# Patient Record
Sex: Female | Born: 1985 | Race: White | Hispanic: No | Smoking: Current every day smoker
Health system: Southern US, Community
[De-identification: ages and names within clinical notes are randomized; demographics above are authoritative.]

## PROBLEM LIST (undated history)

## (undated) ENCOUNTER — Inpatient Hospital Stay (HOSPITAL_COMMUNITY): Payer: Self-pay

## (undated) DIAGNOSIS — F32A Depression, unspecified: Secondary | ICD-10-CM

## (undated) DIAGNOSIS — T7840XA Allergy, unspecified, initial encounter: Secondary | ICD-10-CM

## (undated) DIAGNOSIS — E282 Polycystic ovarian syndrome: Secondary | ICD-10-CM

## (undated) DIAGNOSIS — F329 Major depressive disorder, single episode, unspecified: Secondary | ICD-10-CM

## (undated) HISTORY — DX: Allergy, unspecified, initial encounter: T78.40XA

## (undated) HISTORY — PX: FOOT SURGERY: SHX648

## (undated) HISTORY — PX: WISDOM TOOTH EXTRACTION: SHX21

## (undated) HISTORY — PX: NO PAST SURGERIES: SHX2092

---
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
## 2006-05-13 ENCOUNTER — Emergency Department (HOSPITAL_COMMUNITY): Admission: EM | Admit: 2006-05-13 | Discharge: 2006-05-13 | Payer: Self-pay | Admitting: Emergency Medicine

## 2006-05-17 ENCOUNTER — Emergency Department (HOSPITAL_COMMUNITY): Admission: EM | Admit: 2006-05-17 | Discharge: 2006-05-17 | Payer: Self-pay | Admitting: Emergency Medicine

## 2006-12-11 ENCOUNTER — Emergency Department (HOSPITAL_COMMUNITY): Admission: EM | Admit: 2006-12-11 | Discharge: 2006-12-11 | Payer: Self-pay | Admitting: Emergency Medicine

## 2006-12-12 ENCOUNTER — Emergency Department (HOSPITAL_COMMUNITY): Admission: EM | Admit: 2006-12-12 | Discharge: 2006-12-12 | Payer: Self-pay | Admitting: Emergency Medicine

## 2006-12-30 ENCOUNTER — Ambulatory Visit: Payer: Self-pay | Admitting: Obstetrics and Gynecology

## 2006-12-30 ENCOUNTER — Encounter (INDEPENDENT_AMBULATORY_CARE_PROVIDER_SITE_OTHER): Payer: Self-pay | Admitting: Obstetrics & Gynecology

## 2006-12-30 ENCOUNTER — Encounter (INDEPENDENT_AMBULATORY_CARE_PROVIDER_SITE_OTHER): Payer: Self-pay | Admitting: *Deleted

## 2007-01-19 ENCOUNTER — Emergency Department (HOSPITAL_COMMUNITY): Admission: EM | Admit: 2007-01-19 | Discharge: 2007-01-19 | Payer: Self-pay | Admitting: Emergency Medicine

## 2007-06-30 ENCOUNTER — Emergency Department (HOSPITAL_COMMUNITY): Admission: EM | Admit: 2007-06-30 | Discharge: 2007-06-30 | Payer: Self-pay | Admitting: Emergency Medicine

## 2007-10-17 ENCOUNTER — Ambulatory Visit: Payer: Self-pay | Admitting: Infectious Disease

## 2007-10-17 ENCOUNTER — Observation Stay (HOSPITAL_COMMUNITY): Admission: EM | Admit: 2007-10-17 | Discharge: 2007-10-18 | Payer: Self-pay | Admitting: Emergency Medicine

## 2007-10-24 ENCOUNTER — Ambulatory Visit: Payer: Self-pay | Admitting: Infectious Diseases

## 2007-10-24 DIAGNOSIS — F329 Major depressive disorder, single episode, unspecified: Secondary | ICD-10-CM | POA: Insufficient documentation

## 2007-10-24 DIAGNOSIS — J45909 Unspecified asthma, uncomplicated: Secondary | ICD-10-CM | POA: Insufficient documentation

## 2007-10-24 DIAGNOSIS — N898 Other specified noninflammatory disorders of vagina: Secondary | ICD-10-CM | POA: Insufficient documentation

## 2008-01-22 ENCOUNTER — Emergency Department (HOSPITAL_COMMUNITY): Admission: EM | Admit: 2008-01-22 | Discharge: 2008-01-23 | Payer: Self-pay | Admitting: Emergency Medicine

## 2008-01-23 ENCOUNTER — Telehealth: Payer: Self-pay | Admitting: Infectious Diseases

## 2008-03-01 ENCOUNTER — Emergency Department (HOSPITAL_COMMUNITY): Admission: EM | Admit: 2008-03-01 | Discharge: 2008-03-01 | Payer: Self-pay | Admitting: Emergency Medicine

## 2008-03-07 ENCOUNTER — Emergency Department (HOSPITAL_COMMUNITY): Admission: EM | Admit: 2008-03-07 | Discharge: 2008-03-07 | Payer: Self-pay | Admitting: Emergency Medicine

## 2008-03-10 ENCOUNTER — Inpatient Hospital Stay (HOSPITAL_COMMUNITY): Admission: EM | Admit: 2008-03-10 | Discharge: 2008-03-12 | Payer: Self-pay | Admitting: Emergency Medicine

## 2008-05-03 ENCOUNTER — Telehealth (INDEPENDENT_AMBULATORY_CARE_PROVIDER_SITE_OTHER): Payer: Self-pay | Admitting: *Deleted

## 2008-07-19 ENCOUNTER — Emergency Department (HOSPITAL_COMMUNITY): Admission: EM | Admit: 2008-07-19 | Discharge: 2008-07-19 | Payer: Self-pay | Admitting: Emergency Medicine

## 2008-08-12 ENCOUNTER — Emergency Department (HOSPITAL_COMMUNITY): Admission: EM | Admit: 2008-08-12 | Discharge: 2008-08-13 | Payer: Self-pay | Admitting: Emergency Medicine

## 2008-08-14 ENCOUNTER — Emergency Department (HOSPITAL_COMMUNITY): Admission: EM | Admit: 2008-08-14 | Discharge: 2008-08-14 | Payer: Self-pay | Admitting: Emergency Medicine

## 2008-08-16 ENCOUNTER — Emergency Department (HOSPITAL_COMMUNITY): Admission: EM | Admit: 2008-08-16 | Discharge: 2008-08-17 | Payer: Self-pay | Admitting: Emergency Medicine

## 2008-09-17 ENCOUNTER — Emergency Department (HOSPITAL_COMMUNITY): Admission: EM | Admit: 2008-09-17 | Discharge: 2008-09-17 | Payer: Self-pay | Admitting: Emergency Medicine

## 2008-12-06 ENCOUNTER — Emergency Department (HOSPITAL_COMMUNITY): Admission: EM | Admit: 2008-12-06 | Discharge: 2008-12-06 | Payer: Self-pay | Admitting: Emergency Medicine

## 2009-01-15 ENCOUNTER — Emergency Department (HOSPITAL_COMMUNITY): Admission: EM | Admit: 2009-01-15 | Discharge: 2009-01-15 | Payer: Self-pay | Admitting: Family Medicine

## 2009-01-16 ENCOUNTER — Inpatient Hospital Stay (HOSPITAL_COMMUNITY): Admission: AD | Admit: 2009-01-16 | Discharge: 2009-01-16 | Payer: Self-pay | Admitting: Obstetrics & Gynecology

## 2009-01-17 ENCOUNTER — Ambulatory Visit (HOSPITAL_COMMUNITY): Admission: RE | Admit: 2009-01-17 | Discharge: 2009-01-17 | Payer: Self-pay | Admitting: Obstetrics & Gynecology

## 2009-01-28 ENCOUNTER — Emergency Department (HOSPITAL_COMMUNITY): Admission: EM | Admit: 2009-01-28 | Discharge: 2009-01-28 | Payer: Self-pay | Admitting: Emergency Medicine

## 2009-02-28 ENCOUNTER — Ambulatory Visit: Payer: Self-pay | Admitting: Obstetrics and Gynecology

## 2009-03-01 ENCOUNTER — Encounter: Payer: Self-pay | Admitting: Obstetrics & Gynecology

## 2009-03-01 LAB — CONVERTED CEMR LAB: TSH: 2.708 microintl units/mL (ref 0.350–4.500)

## 2009-03-13 ENCOUNTER — Encounter: Admission: RE | Admit: 2009-03-13 | Discharge: 2009-03-13 | Payer: Self-pay | Admitting: Obstetrics and Gynecology

## 2009-03-15 ENCOUNTER — Ambulatory Visit (HOSPITAL_COMMUNITY): Admission: RE | Admit: 2009-03-15 | Discharge: 2009-03-15 | Payer: Self-pay | Admitting: Obstetrics & Gynecology

## 2009-04-25 ENCOUNTER — Ambulatory Visit: Payer: Self-pay | Admitting: Obstetrics and Gynecology

## 2009-07-08 ENCOUNTER — Emergency Department (HOSPITAL_COMMUNITY): Admission: EM | Admit: 2009-07-08 | Discharge: 2009-07-08 | Payer: Self-pay | Admitting: Emergency Medicine

## 2009-08-01 ENCOUNTER — Ambulatory Visit: Payer: Self-pay | Admitting: Obstetrics & Gynecology

## 2009-10-15 ENCOUNTER — Observation Stay (HOSPITAL_COMMUNITY): Admission: EM | Admit: 2009-10-15 | Discharge: 2009-10-15 | Payer: Self-pay | Admitting: Emergency Medicine

## 2010-03-02 IMAGING — CR DG CHEST 2V
2 series · 2 of 2 positions shown · non-contrast
Comparison: 07/19/2008 and 01/19/2007

CLINICAL DATA: Shortness of breath and cough.

CHEST - 2 VIEW

[w chest pa]
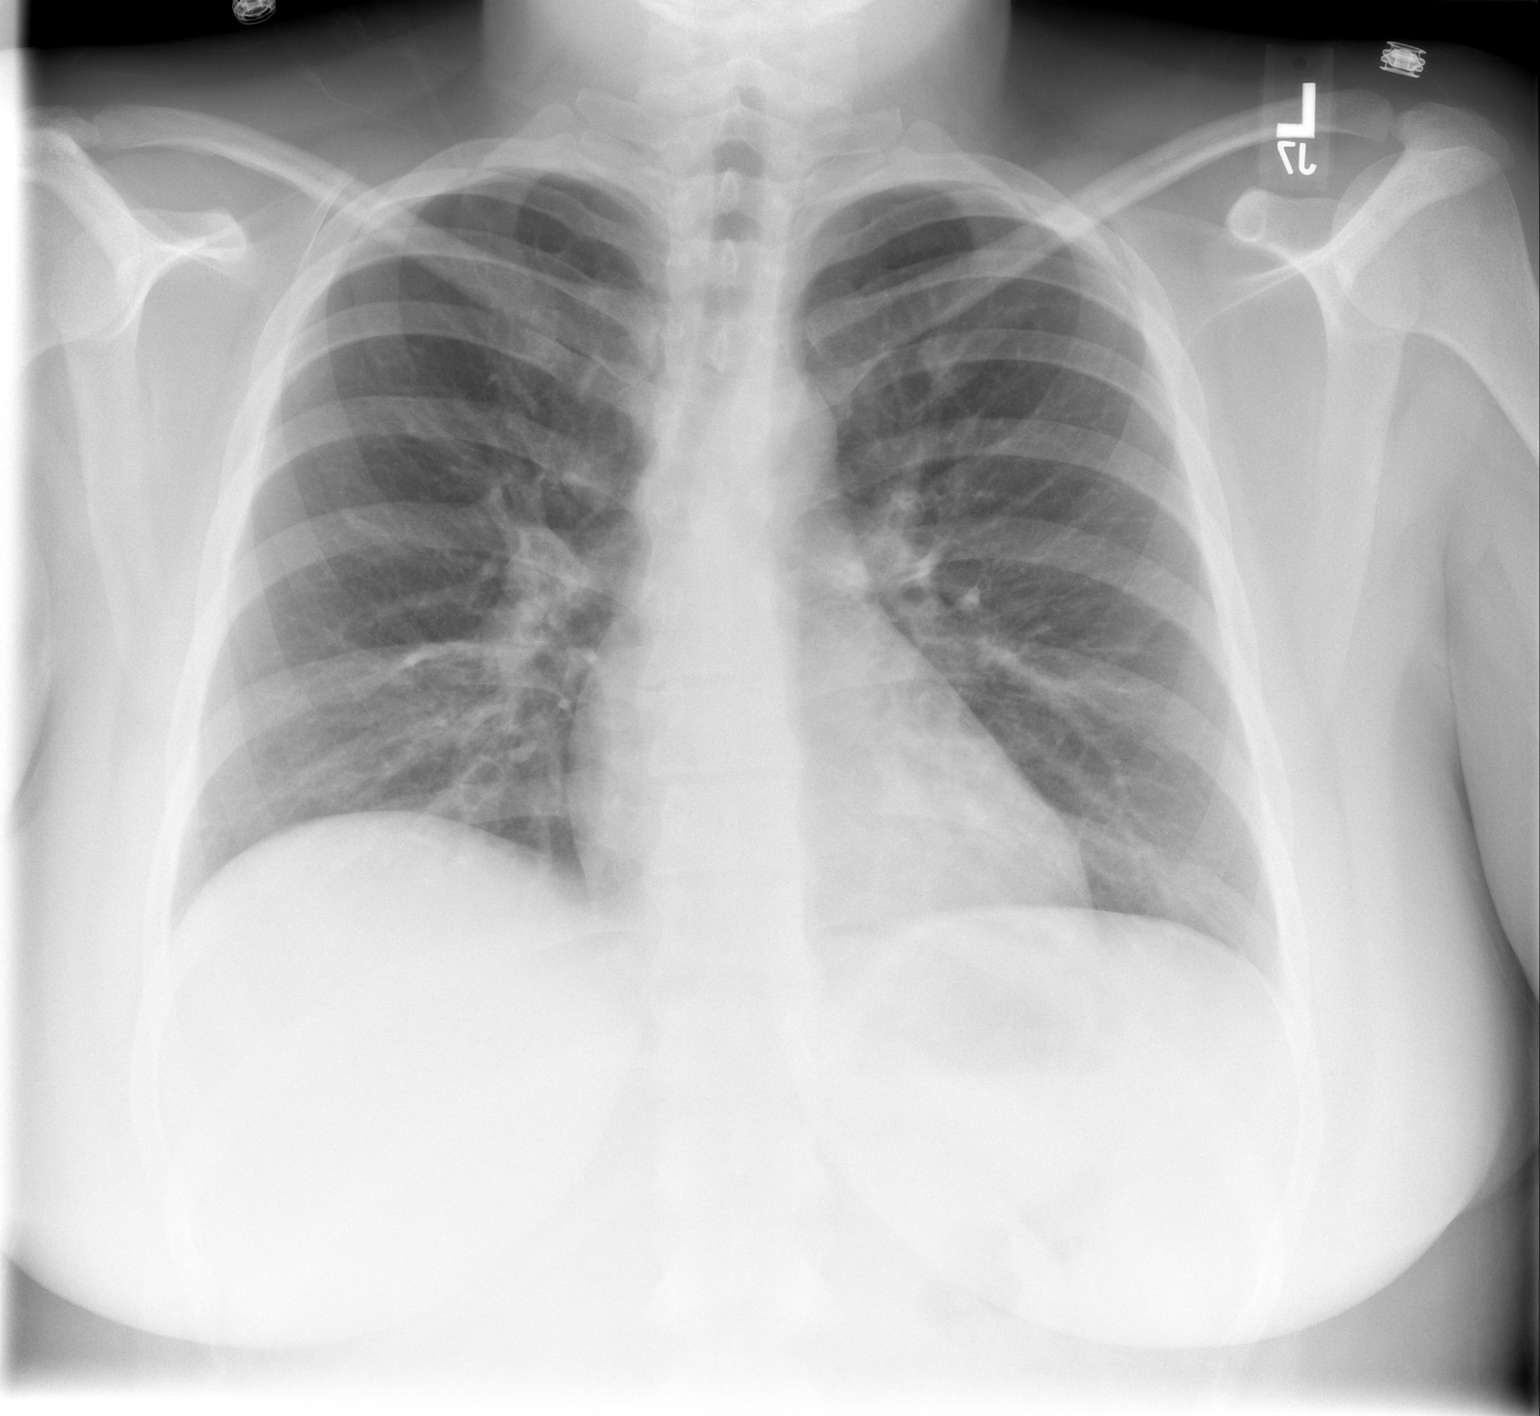

[w chest lat]
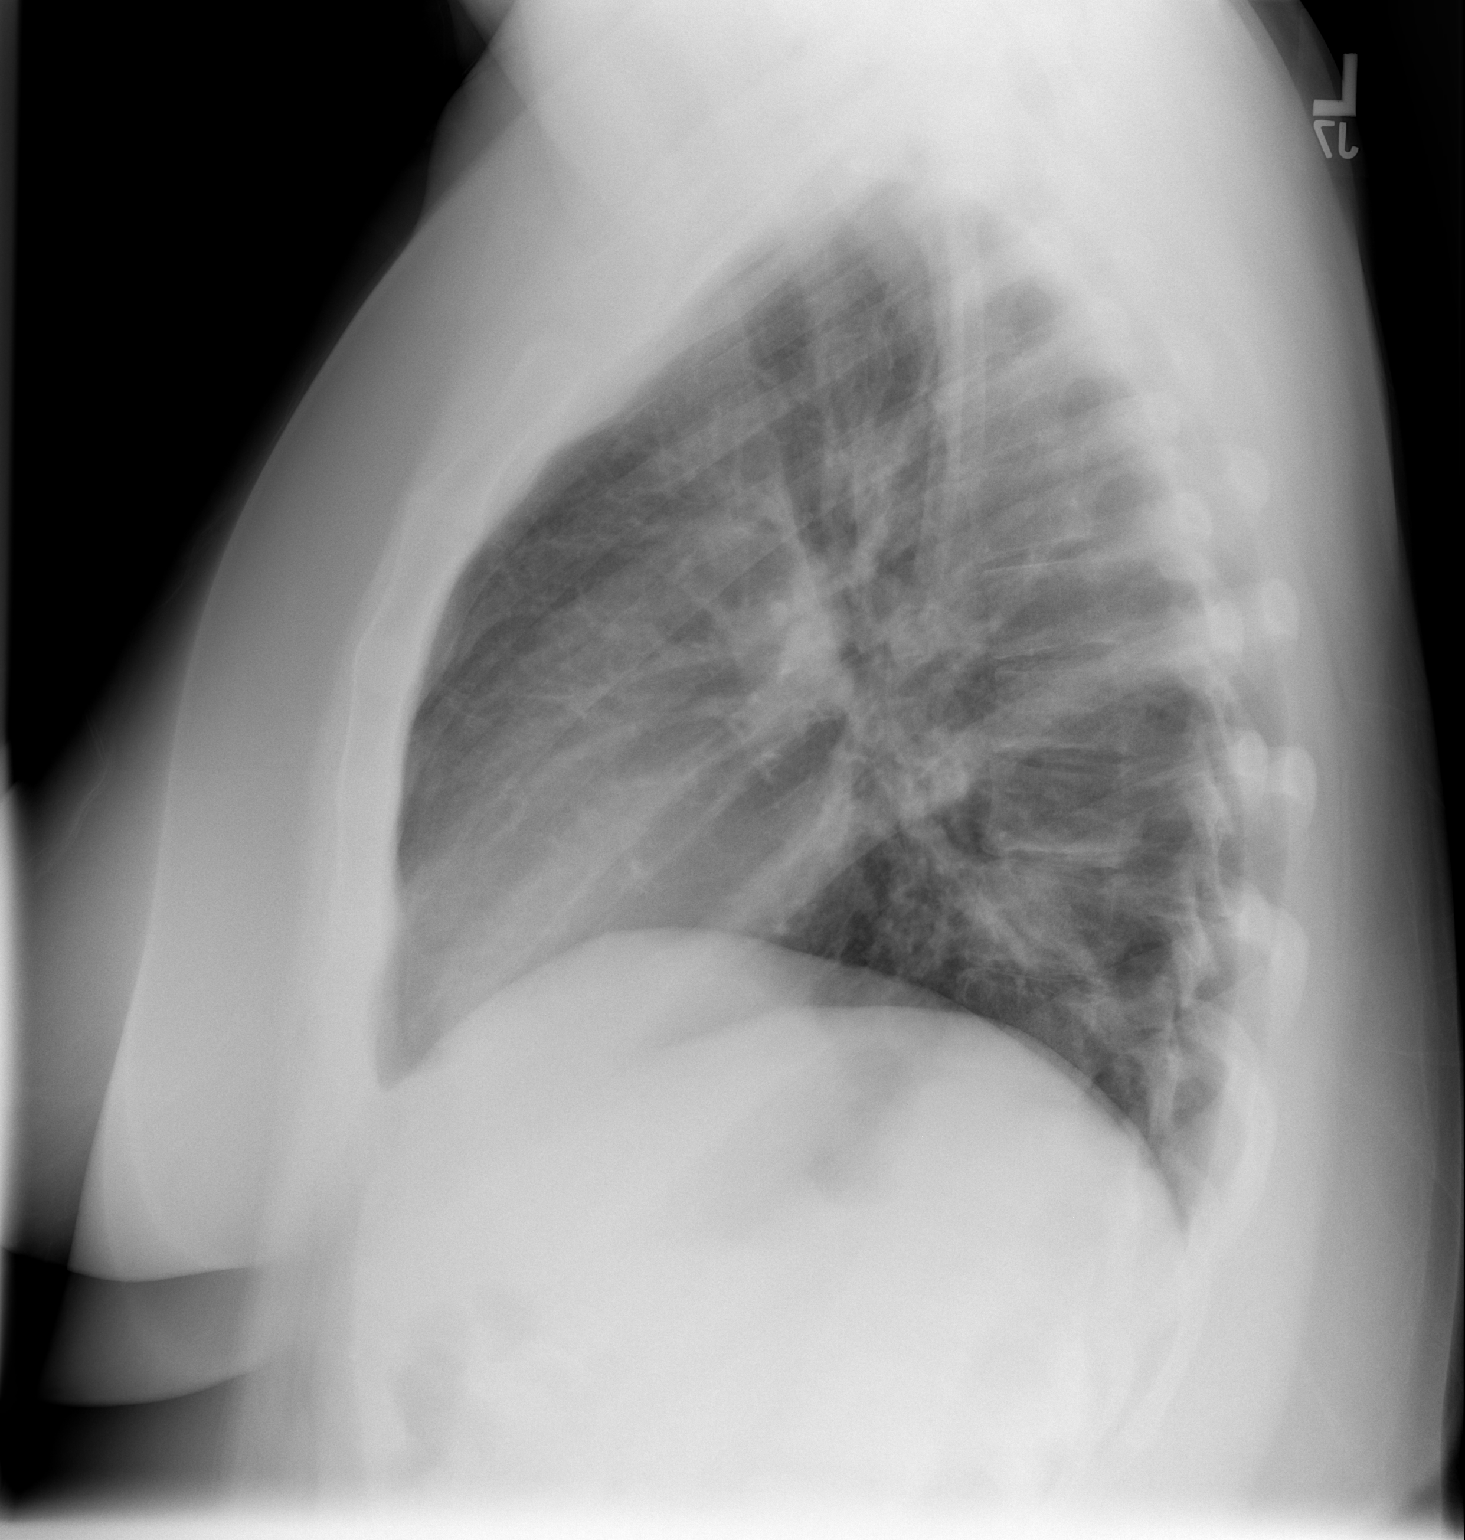

[2 of 2 positions shown; findings below may reference images not displayed]

FINDINGS: Cardiomediastinal silhouette is unremarkable.
Mild peribronchial thickening is stable.
There is no evidence of focal airspace disease, pleural effusions,
or pneumothorax.
No acute bony abnormalities are identified.
IMPRESSION: No evidence of acute cardiopulmonary disease.

Stable mild chronic peribronchial thickening.

## 2010-03-11 ENCOUNTER — Emergency Department (HOSPITAL_COMMUNITY): Admission: EM | Admit: 2010-03-11 | Discharge: 2010-03-11 | Payer: Self-pay | Admitting: Emergency Medicine

## 2010-03-31 ENCOUNTER — Emergency Department (HOSPITAL_COMMUNITY): Admission: EM | Admit: 2010-03-31 | Discharge: 2010-03-31 | Payer: Self-pay | Admitting: Emergency Medicine

## 2010-07-02 IMAGING — US US PELVIS COMPLETE MODIFY
2 series · 13 of 25 positions shown · non-contrast
Comparison: None

CLINICAL DATA: Abdominal and pelvic pain

TRANSABDOMINAL AND TRANSVAGINAL ULTRASOUND OF PELVIS
TECHNIQUE: Both transabdominal and transvaginal ultrasound
examinations of the pelvis were performed including evaluation of
the uterus, ovaries, adnexal regions, and pelvic cul-de-sac.

[Series 1: us pelvis complete modify · 12 of 69 slices shown (1 of 2)]
[im 1/69]
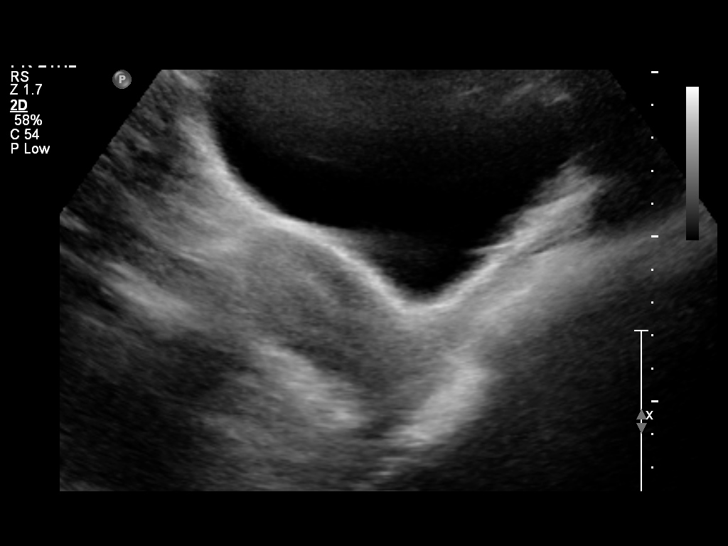
[im 7/69]
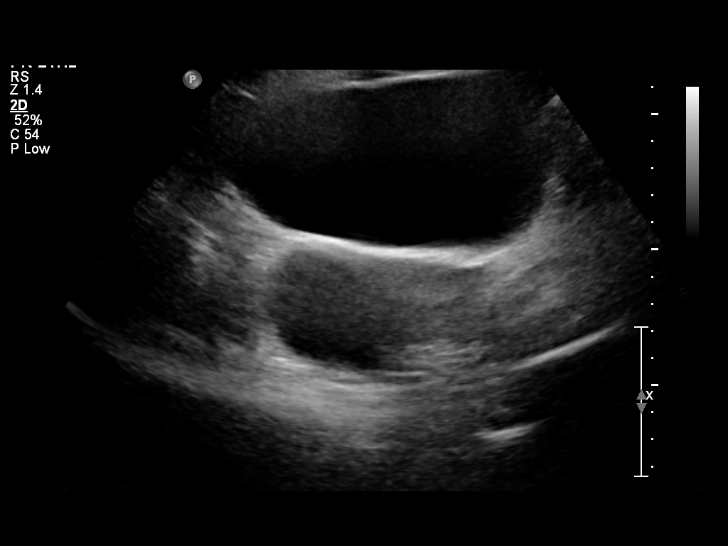
[im 13/69]
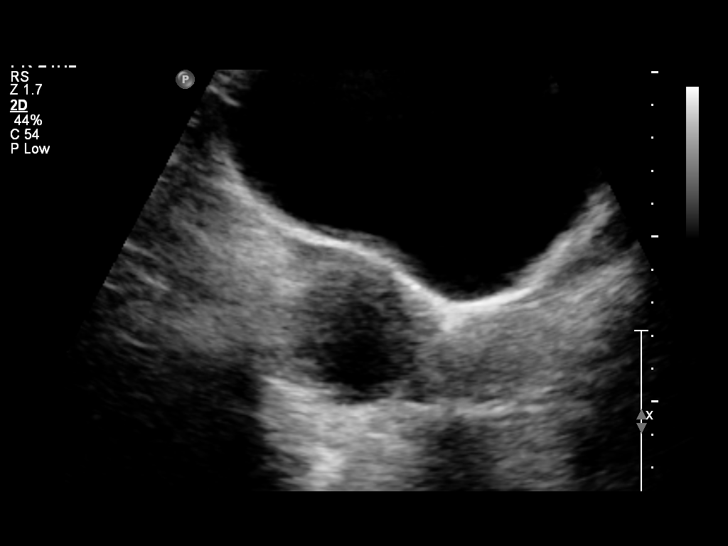
[im 19/69]
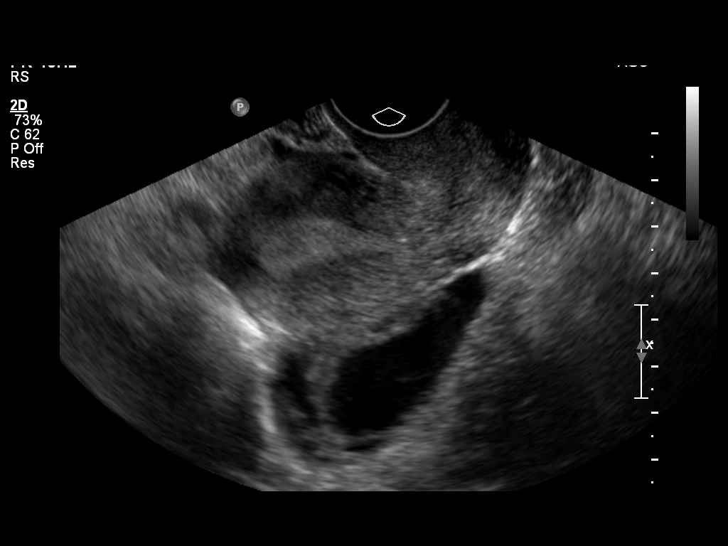
[im 25/69]
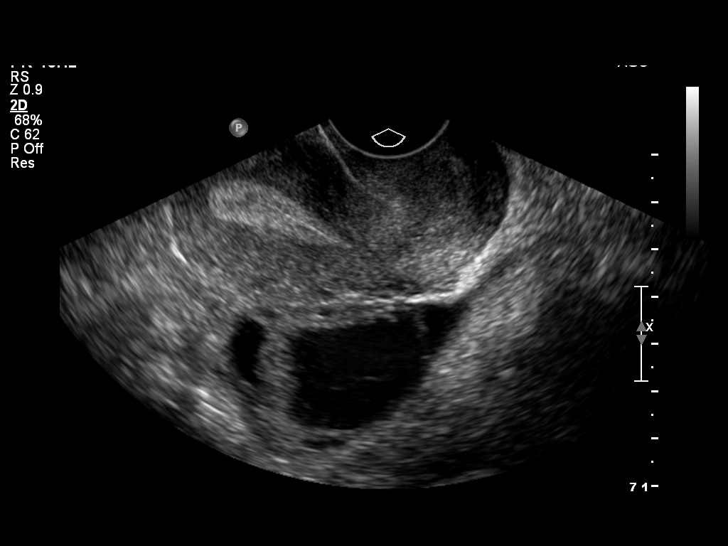
[im 31/69]
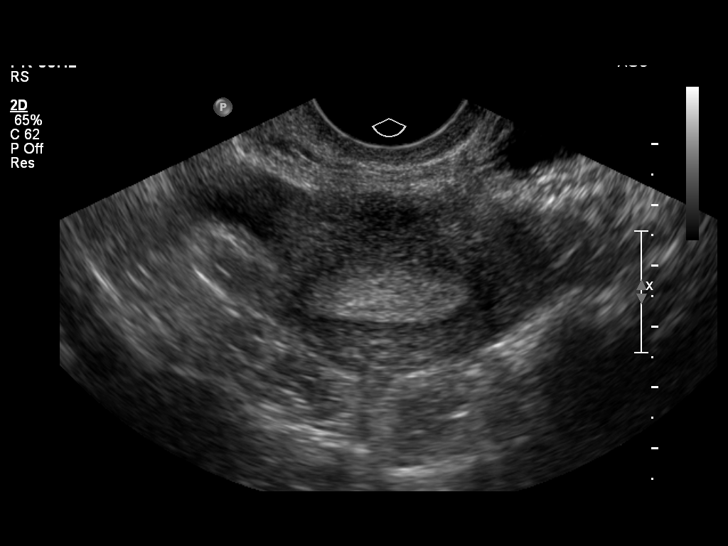
[im 38/69]
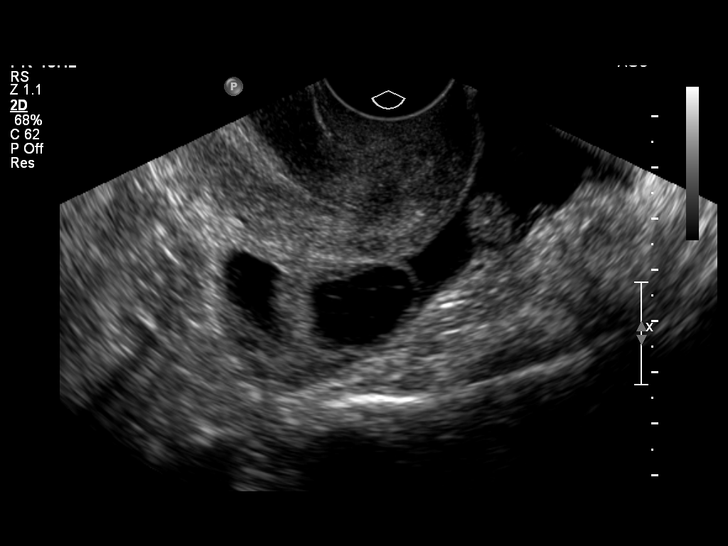
[im 44/69]
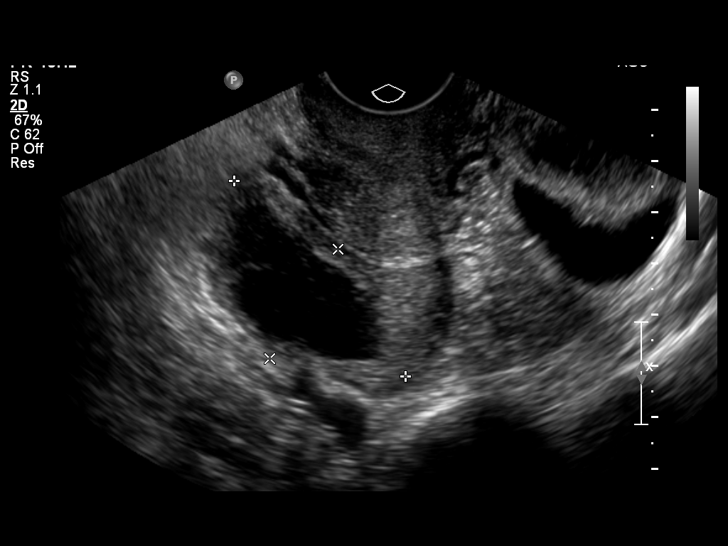
[im 50/69]
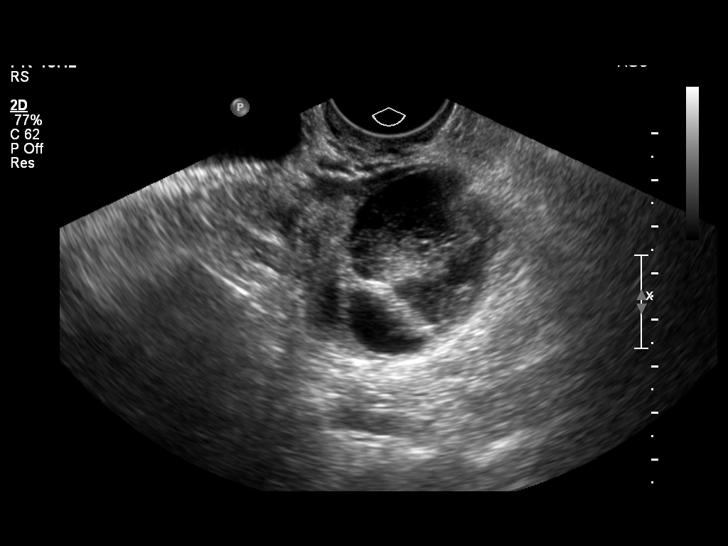
[im 56/69]
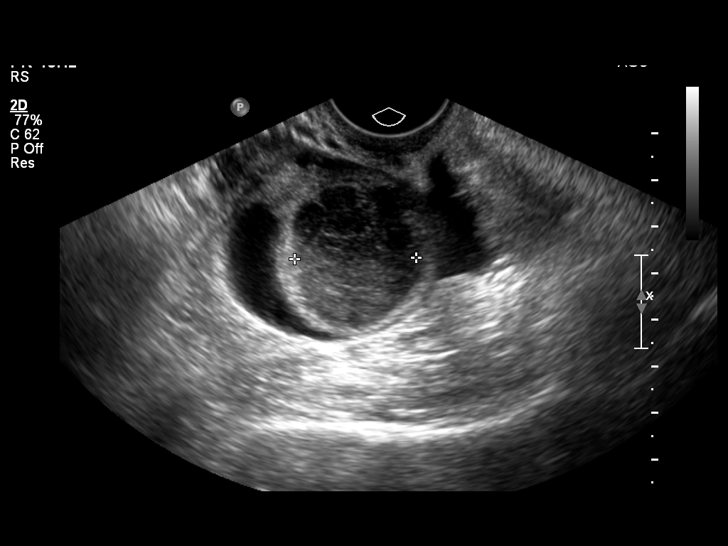
[im 62/69]
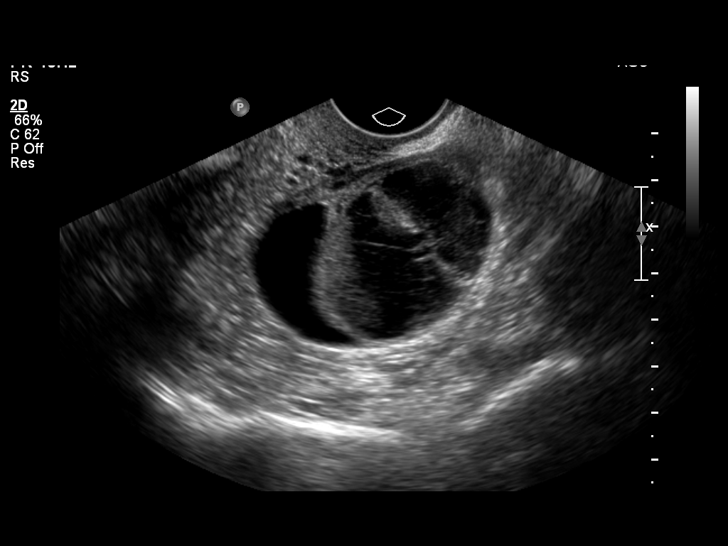
[im 69/69]
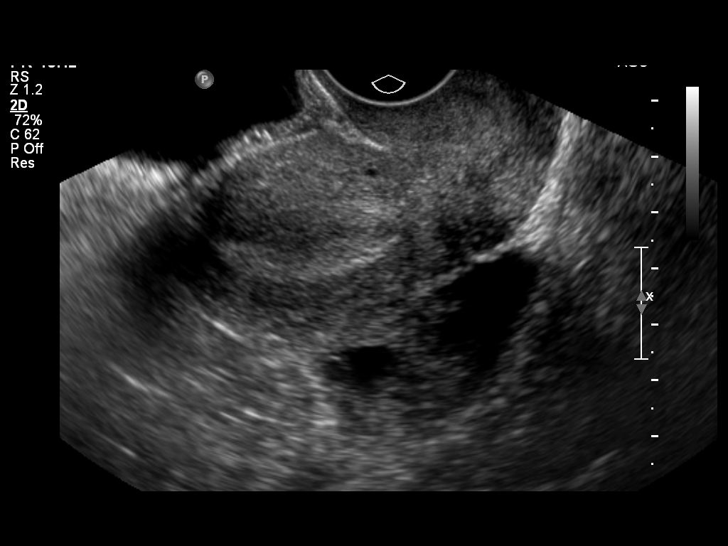

[Series 4: us pelvis complete modify · 6 acquisitions, 1 frame shown (2 of 2)]
[im 6/6]
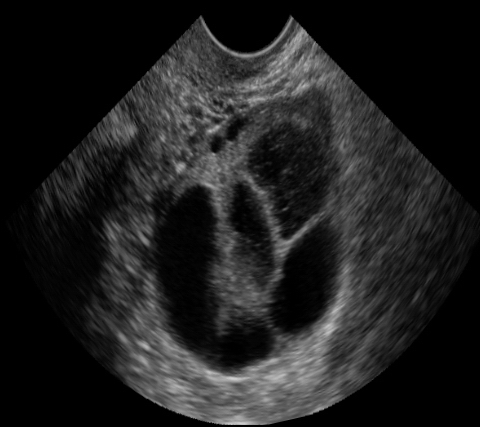

[13 of 25 positions shown; findings below may reference images not displayed]

FINDINGS: The uterus has a sagittal length of 6.5 cm, an AP width
of 3.6 cm and a transverse width of 4.5 cm.  A homogeneous uterine
myometrium is seen.  The endometrial lining is homogeneously
echogenic with an AP width of 8.8 mm.  No areas of focal thickening
or inhomogeneity are identified.

The right ovary measures 5.0 x 2.5 x 3.9 cm and contains a follicle
as well as a collapsing complex cyst measuring 3.8 x 1.3 x 3.3 cm.
This contains diffuse low level echoes and thin internal septations
suggesting a collapsing hemorrhagic cyst.  A small amount of simple
free fluid is identified adjacent to the right ovary and into the
cul-de-sac and may represent stigmata of recent cyst rupture given
the patient's history of right lower quadrant pain.

The left ovary measures 5.4 x 3.8 x 3.9 cm and contains two complex
cystic areas measuring 2.6 x 3.2 x 2.4 cm and 2.8 x 1.3 by 1.6 cm.
These also contain diffuse low level echoes with thin internal
septations suggesting hemorrhagic cysts.  A unilocular simple cyst
is also seen on this side measuring 3.4 x 2.8 by 1.3 cm.
IMPRESSION: Normal uterine myometrium and endometrium.  Bilateral complex
ovarian cysts with appearances most compatible with hemorrhagic
cysts.  The cyst on the right appears to be collapsing and has a
small amount of adjacent simple free fluid which would correlate
with a recent cyst rupture on this side in a patient with a history
of right lower quadrant pain.  Follow-up is recommended in 4-6
weeks for initial short-term reassessment of these suspected
hemorrhagic cysts to confirm interval evolution as would be
expected with this benign process.

## 2010-11-16 NOTE — L&D Delivery Note (Signed)
Delivery Note At 12:50 AM a viable female was delivered via Vaginal, Spontaneous Delivery (Presentation: Left Occiput Anterior).  APGAR: 9, 9; weight 8 lb 13 oz (3997 g).   Placenta status: Intact, Spontaneous.  Cord: 3 vessels with the following complications: None.    Anesthesia: Epidural, local Episiotomy: None Lacerations: Labial tears, 2nd degree perineal Suture Repair: 2.0 3.0 vicryl rapide Est. Blood Loss (mL):   Mom to postpartum.  Baby to nursery-stable.  JACKSON-MOORE,Cynethia Schindler A 08/27/2011, 1:26 AM

## 2010-12-07 ENCOUNTER — Encounter: Payer: Self-pay | Admitting: Obstetrics and Gynecology

## 2011-01-06 ENCOUNTER — Inpatient Hospital Stay (HOSPITAL_COMMUNITY)
Admission: AD | Admit: 2011-01-06 | Discharge: 2011-01-06 | Disposition: A | Discharge status: Home / Self Care | Payer: Medicaid Other | Source: Ambulatory Visit | Attending: Obstetrics & Gynecology | Admitting: Obstetrics & Gynecology

## 2011-01-06 DIAGNOSIS — O99891 Other specified diseases and conditions complicating pregnancy: Secondary | ICD-10-CM | POA: Insufficient documentation

## 2011-01-06 DIAGNOSIS — O9989 Other specified diseases and conditions complicating pregnancy, childbirth and the puerperium: Secondary | ICD-10-CM

## 2011-01-06 LAB — URINE MICROSCOPIC-ADD ON

## 2011-01-06 LAB — URINALYSIS, ROUTINE W REFLEX MICROSCOPIC
Bilirubin Urine: NEGATIVE
Hgb urine dipstick: NEGATIVE
Ketones, ur: NEGATIVE mg/dL
Nitrite: POSITIVE — AB
Specific Gravity, Urine: 1.01 (ref 1.005–1.030)
Urobilinogen, UA: 0.2 mg/dL (ref 0.0–1.0)
pH: 6 (ref 5.0–8.0)

## 2011-01-06 LAB — POCT PREGNANCY, URINE: Preg Test, Ur: POSITIVE

## 2011-01-09 LAB — URINE CULTURE
Colony Count: >=100000
Culture  Setup Time: 201202220148

## 2011-01-16 ENCOUNTER — Inpatient Hospital Stay (HOSPITAL_COMMUNITY): Payer: Medicaid Other

## 2011-01-16 ENCOUNTER — Inpatient Hospital Stay (HOSPITAL_COMMUNITY)
Admission: AD | Admit: 2011-01-16 | Discharge: 2011-01-16 | Disposition: A | Discharge status: Home / Self Care | Payer: Medicaid Other | Source: Ambulatory Visit | Attending: Obstetrics & Gynecology | Admitting: Obstetrics & Gynecology

## 2011-01-16 DIAGNOSIS — N390 Urinary tract infection, site not specified: Secondary | ICD-10-CM

## 2011-01-16 DIAGNOSIS — O2390 Unspecified genitourinary tract infection in pregnancy, unspecified trimester: Secondary | ICD-10-CM

## 2011-01-16 DIAGNOSIS — R1031 Right lower quadrant pain: Secondary | ICD-10-CM

## 2011-01-16 LAB — WET PREP, GENITAL
Clue Cells Wet Prep HPF POC: NONE SEEN
Trich, Wet Prep: NONE SEEN

## 2011-01-16 LAB — CBC
MCHC: 34.9 g/dL (ref 30.0–36.0)
Platelets: 240 10*3/uL (ref 150–400)
RDW: 13.5 % (ref 11.5–15.5)
WBC: 8.3 10*3/uL (ref 4.0–10.5)

## 2011-01-16 LAB — HCG, QUANTITATIVE, PREGNANCY: hCG, Beta Chain, Quant, S: 76188 m[IU]/mL — ABNORMAL HIGH (ref <5)

## 2011-01-17 LAB — GC/CHLAMYDIA PROBE AMP, GENITAL: GC Probe Amp, Genital: NEGATIVE

## 2011-02-13 ENCOUNTER — Inpatient Hospital Stay (HOSPITAL_COMMUNITY): Payer: Medicaid Other

## 2011-02-13 ENCOUNTER — Inpatient Hospital Stay (HOSPITAL_COMMUNITY)
Admission: AD | Admit: 2011-02-13 | Discharge: 2011-02-13 | Disposition: A | Discharge status: Home / Self Care | Payer: Medicaid Other | Source: Ambulatory Visit | Attending: Obstetrics & Gynecology | Admitting: Obstetrics & Gynecology

## 2011-02-13 DIAGNOSIS — O209 Hemorrhage in early pregnancy, unspecified: Secondary | ICD-10-CM | POA: Insufficient documentation

## 2011-02-13 LAB — CBC
MCH: 30.4 pg (ref 26.0–34.0)
MCV: 86.8 fL (ref 78.0–100.0)
Platelets: 238 10*3/uL (ref 150–400)
RBC: 4.38 MIL/uL (ref 3.87–5.11)
RDW: 13.5 % (ref 11.5–15.5)
WBC: 8.7 10*3/uL (ref 4.0–10.5)

## 2011-02-13 LAB — ABO/RH: ABO/RH(D): O POS

## 2011-02-13 LAB — WET PREP, GENITAL
Clue Cells Wet Prep HPF POC: NONE SEEN
Trich, Wet Prep: NONE SEEN
Yeast Wet Prep HPF POC: NONE SEEN

## 2011-02-17 LAB — URINE MICROSCOPIC-ADD ON

## 2011-02-17 LAB — URINALYSIS, ROUTINE W REFLEX MICROSCOPIC
Glucose, UA: NEGATIVE mg/dL
Hgb urine dipstick: NEGATIVE
Specific Gravity, Urine: >1.03 — ABNORMAL HIGH (ref 1.005–1.030)
pH: 5.5 (ref 5.0–8.0)

## 2011-02-20 LAB — POCT PREGNANCY, URINE: Preg Test, Ur: NEGATIVE

## 2011-02-21 LAB — URINALYSIS, ROUTINE W REFLEX MICROSCOPIC
Bilirubin Urine: NEGATIVE
Hgb urine dipstick: NEGATIVE
Ketones, ur: NEGATIVE mg/dL
Nitrite: NEGATIVE
Specific Gravity, Urine: 1.025 (ref 1.005–1.030)
pH: 7 (ref 5.0–8.0)

## 2011-02-21 LAB — URINE MICROSCOPIC-ADD ON

## 2011-02-21 LAB — POCT PREGNANCY, URINE: Preg Test, Ur: NEGATIVE

## 2011-02-26 LAB — CBC
MCV: 85.5 fL (ref 78.0–100.0)
Platelets: 278 10*3/uL (ref 150–400)
WBC: 7.2 10*3/uL (ref 4.0–10.5)

## 2011-02-26 LAB — POCT URINALYSIS DIP (DEVICE)
Glucose, UA: NEGATIVE mg/dL
Nitrite: NEGATIVE
Protein, ur: NEGATIVE mg/dL
Urobilinogen, UA: 0.2 mg/dL (ref 0.0–1.0)

## 2011-02-26 LAB — WET PREP, GENITAL
Trich, Wet Prep: NONE SEEN
Yeast Wet Prep HPF POC: NONE SEEN

## 2011-02-26 LAB — URINALYSIS, ROUTINE W REFLEX MICROSCOPIC
Glucose, UA: NEGATIVE mg/dL
Ketones, ur: 15 mg/dL — AB
Nitrite: NEGATIVE
Protein, ur: NEGATIVE mg/dL
Urobilinogen, UA: 0.2 mg/dL (ref 0.0–1.0)

## 2011-02-26 LAB — POCT PREGNANCY, URINE
Preg Test, Ur: NEGATIVE
Preg Test, Ur: NEGATIVE

## 2011-03-08 ENCOUNTER — Inpatient Hospital Stay (HOSPITAL_COMMUNITY)
Admission: AD | Admit: 2011-03-08 | Discharge: 2011-03-08 | Disposition: A | Discharge status: Home / Self Care | Payer: Medicaid Other | Source: Ambulatory Visit | Attending: Obstetrics & Gynecology | Admitting: Obstetrics & Gynecology

## 2011-03-08 DIAGNOSIS — S3140XA Unspecified open wound of vagina and vulva, initial encounter: Secondary | ICD-10-CM

## 2011-03-08 DIAGNOSIS — N898 Other specified noninflammatory disorders of vagina: Secondary | ICD-10-CM

## 2011-03-08 DIAGNOSIS — O9989 Other specified diseases and conditions complicating pregnancy, childbirth and the puerperium: Secondary | ICD-10-CM

## 2011-03-08 DIAGNOSIS — O99891 Other specified diseases and conditions complicating pregnancy: Secondary | ICD-10-CM | POA: Insufficient documentation

## 2011-03-31 ENCOUNTER — Other Ambulatory Visit: Payer: Self-pay | Admitting: Obstetrics & Gynecology

## 2011-03-31 DIAGNOSIS — Z3689 Encounter for other specified antenatal screening: Secondary | ICD-10-CM

## 2011-03-31 NOTE — H&P (Signed)
NAME:  Breanna [name], Breanna [name]                ACCOUNT NO.:  [id number]   MEDICAL RECORD NO.:  [id number]          PATIENT TYPE:  INP   LOCATION:  5006                         FACILITY:  [facility]   PHYSICIAN:  Darryl D. Prime, MD    DATE OF BIRTH:  05-Sep-1986   DATE OF ADMISSION:  03/09/2008  DATE OF DISCHARGE:                              HISTORY & PHYSICAL   PRIMARY CARE PHYSICIAN:  None.   The patient's total visit time was approximately 52 minutes.   CHIEF COMPLAINT:  Shortness of breath.  The patient is a good historian.   HISTORY OF PRESENT ILLNESS:  Ms. Breanna [name] is a 25 year old female with a  history of asthma over the last 3 years.  Hospitalized for this in  December 2008, never intubated.  She has a history of tobacco abuse and  she notes severe shortness of breath over the last 3 days.  She was seen  in the emergency room here apparently 2 times because of this.  She has  had yellow productive sputum production, mild wheezing, profound  shortness of breath and cough.  The patient has been on azithromycin x2  doses, inhalers and also has been on prednisone.  She notes she has been  on prednisone for 2 weeks and was on a prednisone prior to that.  The  patient denies any sick contacts.  She has not had a flu shot this  season and has not had a Pneumovax.  The patient on chest x-ray was  shown to have a multilobar pneumonia and CT chest a few days ago.  Chest  x-ray here shows possible worsening infiltrates bilaterally.  The  patient notes high fevers and chills at home.   PAST MEDICAL/SURGICAL HISTORY:  1. History of asthma persistent as above.  2. History of tobacco abuse.  She just started over the last year.  3. History of elevated glucose on steroid therapy.  4. History of obesity.  5. History of depression.   MEDICATIONS:  1. Albuterol inhaler.  2. Combivent inhaler.  3. Azithromycin.  4. Prednisone.   ALLERGIES:  NO KNOWN DRUG ALLERGIES.   SOCIAL HISTORY:  Positive for  tobacco as above.  No alcohol.  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   FAMILY HISTORY:  No family history of asthma.   REVIEW OF SYSTEMS:  A 14-review of systems negative unless stated above.   PHYSICAL EXAMINATION:  VITAL SIGNS:  Blood pressure is 124/77 with a  pulse of 109, respiratory rate of 14-20, sats are 92% on room air and as  low as 90% on room air at one point, temperature is 102.5.  HEENT:  Eyes:  Pupils are equal, round and reactive to light with  extraocular movements being intact.  Conjunctiva is not pale.  Sclerae  anicteric.  Oropharynx:  There are no posterior oropharyngeal lesions.  NECK:  Supple with no lymphadenopathy or thyromegaly.  No carotid  bruits.  No jugular venous distention.  LUNGS:  Showed bilateral inspiratory wheezing diffusely and bilateral  crackles.  There are rales at  the bases bilaterally.  The patient had a  poor inspiratory effort.  ABDOMEN:  Obese, soft, nontender, nondistended with no  hepatosplenomegaly.  EXTREMITIES:  Show no clubbing, cyanosis or edema.  NEUROLOGIC:  She is alert and oriented x4 with cranial nerves grossly  intact.  Strength and sensation grossly intact.   LABORATORY DATA:  D-dimer on March 07, 2008 was 0.5, hemoglobin on I-  STAT is14.6, hematocrit 43, white count was 8.8 on March 09, 2008,  hemoglobin 13.1, hematocrit 39.8, platelets 280,  sodium 138, potassium  3.6, chloride 105, bicarb 23, BUN 6, creatinine 1.9, glucose of 94.   DIAGNOSTICS:  Chest x-ray as above.   ASSESSMENT/PLAN:  This is a patient with a history of asthma, history of  tobacco abuse, who now presents with a significant pneumonia, fever,  shortness of breath and hypoxia.  1. For her pneumonia with a history of asthma, we will give her Solu-      Medrol x1 dose and then give her Rocephin and azithromycin IV.  She      will get sputum cultures.  She will be placed on nebulizers.  Blood      cultures if she spikes again.   2. For her tobacco abuse, we will counsel on discontinuation      aggressively.  3. For her asthma, treatments as above.  4. For her hypoxia likely related to pneumonia, we will treat the      pneumonia.  5. GI prophylaxis with Zantac or proton pump inhibitor.  6. DVT prophylaxis with pneumatic compression devices or enoxaparin.      Darryl D. Prime, MD  Electronically Signed     DDP/MEDQ  D:  03/10/2008  T:  03/10/2008  Job:  [id number]

## 2011-03-31 NOTE — Group Therapy Note (Signed)
NAME:  Breanna [name], Breanna [name] NO.:  [id number]   MEDICAL RECORD NO.:  [id number]          PATIENT TYPE:  WOC   LOCATION:  [facility]                   FACILITY:  [facility]   PHYSICIAN:  Sid Falcon, CNM  DATE OF BIRTH:  [birth year]   DATE OF SERVICE:                                  CLINIC NOTE   NARRATIVE:  The patient is here for evaluation of right pelvic pain.  She was seen at maternity admissions unit on January 16, 2009 with reports  of irregular menses since November.  Prior to that, patient had a  regular cycle every 4 weeks.  The patient denies pelvic pain.  She is  reporting cycles occurring every 1-2 months.  At the maternity  admissions visit, GC and CT and wet prep was done.  GC and CT were  negative.  Wet prep showed clue cells.  It was not treated at that  visit.  The patient is reporting continued white discharge with odor  today.  Patient began a new relationship in December 2009.  She smokes  approximately a half a pack of cigarettes per day.  Denies any facial  hair or midline body hair, and no changes in life in the past 4 months.  The patient denies pain with intercourse or lower back pain.  Pain is  associated with the lower right side.  The ultrasound on March 4th  showed multiple ovarian cysts bilaterally believed to be hemorrhagic.  She was recommended to have a follow-up ultrasound in 6 weeks from that  ultrasound.  Today the patient is reporting a last menstrual period of  February 24, 2009.  Last Pap smear was 2 years ago, negative.  No history  of abnormal Pap smears.   PHYSICAL EXAMINATION:  GENERAL:  On exam, the patient is alert and  oriented x3, overweight.  NECK:  No thyromegaly.  No dominant masses, nontender with palpation.  CARDIOVASCULAR:  Regular rate and rhythm without murmurs, gallops or  rubs.  LUNGS:  Clear to auscultation bilaterally.  BREASTS:  Right breast has a small mobile lesion at approximately 6  o'clock, approximately 1 x 2  cm in size.  No nipple discharge.  No  retractions.  No dimpling.  Left breast normal to touch, soft, no  dominant masses.  No nipple discharge.  No retractions.  ABDOMEN:  Positive bowel sounds x4.  PELVIC:  White discharge.  Positive odor.  Cervix with no lesions.  No  abnormal discharge.  Negative cervical motion tenderness.  Uterus  difficult to assess secondary to weight.  Right adnexa/ovary is slightly  enlarged.  Left nontender with palpation, normal size.  No dominant  masses palpated.   ASSESSMENT:  1. Obesity.  2. Polycystic ovarian syndrome.  3. Right breast lesion.   PLAN:  1. Redo ultrasound as recommended in 2 weeks from now.  2. Laboratories:  TSH, FSH, LH, prolactin level.  3. STD screening per patient request, RPR and HIV.  4. Schedule an ultrasound for the right breast and defer oral      contraceptive pills until results of ultrasound obtained.  5. The patient  will follow up for a visit in 2 weeks.  6. Prescriptions for ibuprofen as needed for right pelvic pain, Flagyl      500 mg 1 p.o. b.i.d. x14 days and albuterol MDI and per patient      request due to history of asthma.      Sid Falcon, CNM     WM/MEDQ  D:  02/28/2009  T:  02/28/2009  Job:  [id number]

## 2011-03-31 NOTE — Discharge Summary (Signed)
NAMEMarland Kitchen  EDRA, RICCARDI NO.:  [id number]   MEDICAL RECORD NO.:  [id number]          PATIENT TYPE:  INP   LOCATION:  5006                         FACILITY:  [facility]   PHYSICIAN:  Michaelyn Barter, M.D. DATE OF BIRTH:  [birth year]   DATE OF ADMISSION:  03/09/2008  DATE OF DISCHARGE:  03/12/2008                               DISCHARGE SUMMARY   PRIMARY CARE DOCTOR:  Unassigned.   FINAL DIAGNOSES:  1. Bilateral pneumonia.  2. Acute asthma exacerbation.  3. Hypokalemia.   PROCEDURES:  Portable chest x-ray is completed on April 25 and March 11, 2008.   HISTORY OF PRESENT ILLNESS:  Ms. Breanna [name] is a 25 year old female with the  past medical history of asthma.  She complained of progressive shortness  of breath that has started 3 days prior to this admission.  She had also  experienced at least 2 ER visits during this time frame.  She complained  of a cough which was productive accompanied by wheezing and progressive  shortness of breath.  She has been started on azithromycin as well as  steroids and nebulized inhalers.   PAST MEDICAL HISTORY:  Please see that dictated by Dr. Rachael Fee Prime.   HOSPITAL COURSE:  1. Bilateral pneumonia.  A chest x-ray completed on March 10, 2008,      revealed slight worsening of pneumonia throughout both lungs which      is most confluent in the right middle and lower lobes.  Empiric IV      Rocephin and azithromycin were started.  The patient remained      afebrile throughout her hospital course and by the date of      discharge she indicated that although she was still coughing she      felt better.  2. Acute asthma exacerbation.  The patient was mildly hypoxic.  Oxygen      nebulized breathing treatments and IV Solu-Medrol was started on      earlier portion of her hospitalization.  Later Solu-Medrol was      discontinued and prednisone was started.  The patient's breathing      improved throughout her hospital course.  Her O2 sats  increased      into the lower 90s by the date of discharge.  3. Hypokalemia.  Potassium supplementation was provided to the      patient.  On the date of discharge, the patient indicated that she      felt better.  She asked the nurse to call me so that she could be      discharged from the hospital.  Her temperature was 97.4, heart rate      74, respirations 20, and blood pressure 115/80.   The patient's labs on the date of discharge; her hemoglobin A1c was 6,  cholesterol 151, LDL 89, sodium 138, potassium to 3.2, chloride 103, CO2  26, glucose is 78, BUN 4, creatinine 0.75 and calcium 8.9.  White blood  cell count 11.4, hemoglobin 11.4, hematocrit 34.4, and platelets 263.  The decision was made to discharge the patient from the hospital  as she  requested.   Her medications consisted of:  1. Prednisone tapering dose pack.  2. Moxifloxacin 400 mg p.o. daily.  3. Combivent MDI 2 puffs q.i.d.  4. Mucinex 600 mg p.o. q.12 h.  5. Prozac 25 mg p.o. daily.  6. Atrovent 0.5 mg via nebulizer q.8 h p.r.n.  7. Albuterol 2.5 mg via nebulizer q.4 h p.r.n.   The patient will be told to follow up with her primary care doctor  within the next 1-2 weeks.      Michaelyn Barter, M.D.  Electronically Signed     OR/MEDQ  D:  03/12/2008  T:  03/13/2008  Job:  [id number]

## 2011-04-03 ENCOUNTER — Ambulatory Visit (HOSPITAL_COMMUNITY)
Admission: RE | Admit: 2011-04-03 | Discharge: 2011-04-03 | Disposition: A | Discharge status: Home / Self Care | Payer: Medicaid Other | Source: Ambulatory Visit | Attending: Obstetrics & Gynecology | Admitting: Obstetrics & Gynecology

## 2011-04-03 DIAGNOSIS — Z3689 Encounter for other specified antenatal screening: Secondary | ICD-10-CM | POA: Insufficient documentation
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
## 2011-04-03 NOTE — Group Therapy Note (Signed)
NAME:  [name], [name]                          ACCOUNT NO.:  [id number]   MEDICAL RECORD NO.:  [id number]                   PATIENT TYPE:  WOC   LOCATION:  [facility]                           FACILITY:  [facility]   PHYSICIAN:  Argentina Donovan, MD                     DATE OF BIRTH:  1986/06/13   DATE OF SERVICE:                                    CLINIC NOTE   CHIEF COMPLAINT:  STD check.   SUBJECTIVE:  1.  Ms. Breanna [name] is a 25 year old white female who has been here several times      for STD checks.  She states currently she is not sexually active but has      had a few sexual partners, the last one in April 2005.  She does      complain of a yellow discharge that she states is watery.  She states      there is some odor.  No fevers, no dysuria, no bowel movement changes,      no abdominal pain.  She has been having regular periods and her last      menstrual period was July 19, 2004.  2.  Contraception.  She is refusing birth control as she states she is going      to abstinent.   SOCIAL HISTORY:  She does not smoke.   OBJECTIVE:  VITAL SIGNS:  Noted.  GENERAL:  She is an obese female in no acute distress.  PELVIC:  Reveals normal external female genitalia.  Normal urethra and  vaginal wall.  Cervix appears nulliparous.  Bimanual exam was within normal  limits but difficult due to body habitus.   ASSESSMENT AND PLAN:  1.  Sexually-transmitted disease check.  A wet prep has been done today as      well as a GC and chlamydia.  We will call her with any positive results.      She did not want HIV test today.  2.  Contraception.  I did encourage her to use something but she is      currently refusing.   She is to return p.r.n.     Jon Gills, MD                       Argentina Donovan, MD    LC/MEDQ  D:  07/25/2004  T:  07/25/2004  Job:  [id number]

## 2011-04-03 NOTE — Group Therapy Note (Signed)
NAME:  Breanna [name], Breanna [name]                          ACCOUNT NO.:  [id number]   MEDICAL RECORD NO.:  [id number]                   PATIENT TYPE:  OUT   LOCATION:  [facility]                           FACILITY:  [facility]   PHYSICIAN:  Elsie Lincoln, MD                   DATE OF BIRTH:  Jan 25, 1986   DATE OF SERVICE:  01/29/2004                                    CLINIC NOTE   REASON FOR VISIT:  The patient is a 25 year old female G0; LMP January 16, 2004; who presents after having first sexual intercourse experience 3 days  ago.  The patient has just started experimenting with sex, alcohol, and  marijuana over the past several months.  The patient has given oral sex but  has not received and had one vaginal intercourse on Saturday with a condom.  However, the patient states she only knew the man for several hours.  The  patient was brought her today by her pastor who wants her to get checked out  for sexually-transmitted diseases.  On further history the patient has been  drinking to excess on weekend parties and has tried marijuana three times.  The patient denies other drugs.  The patient also denies receiving oral sex  or anal sex and only has sex with men.  The patient states she has some  abnormal vaginal discharge but she has had this prior to the sexual  encounter.   PAST MEDICAL HISTORY:  Denies.   PAST SURGICAL HISTORY:  Denies.   ALLERGIES:  Denies.   MEDICATIONS:  Denies.   LAST MENSTRUAL PERIOD:  January 16, 2004.   GYNECOLOGICAL HISTORY:  Sexual history as above.  No other problems other  than irregular menses since her menarche at age 55.   PHYSICAL EXAMINATION:  VITAL SIGNS:  Blood pressure 135/73, pulse 95, weight  277.9, height 5 feet 10 inches.  ABDOMEN:  Obese, nontender, nondistended.  PELVIC:  Genitalia:  Tanner V.  Vagina:  Small amount of mucus, appears  physiologic, no blood.  Cervix:  Small, nontender, nondistended.  Uterus and  adnexa are difficult to palpate  secondary to body habitus.   1. Wet prep and GC and chlamydia cultures done.  2. Hepatitis B, hepatitis C, HIV, RPR sent.  3. Beta hCG sent.  4. Plan B prescription given.  5. Extensive counseling on safe sex and the patient is to be put on OCPs for     cycle regulation and to prevent pregnancy.                                               Elsie Lincoln, MD    KL/MEDQ  D:  01/29/2004  T:  01/30/2004  Job:  [id number]
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
## 2011-06-18 DIAGNOSIS — J069 Acute upper respiratory infection, unspecified: Secondary | ICD-10-CM

## 2011-07-29 ENCOUNTER — Other Ambulatory Visit: Payer: Self-pay | Admitting: Obstetrics

## 2011-08-11 ENCOUNTER — Encounter (HOSPITAL_COMMUNITY): Payer: Self-pay | Admitting: *Deleted

## 2011-08-11 ENCOUNTER — Inpatient Hospital Stay (HOSPITAL_COMMUNITY)
Admission: AD | Admit: 2011-08-11 | Discharge: 2011-08-11 | Disposition: A | Discharge status: Home / Self Care | Payer: Medicaid Other | Source: Ambulatory Visit | Attending: Obstetrics & Gynecology | Admitting: Obstetrics & Gynecology

## 2011-08-11 DIAGNOSIS — J45909 Unspecified asthma, uncomplicated: Secondary | ICD-10-CM | POA: Insufficient documentation

## 2011-08-11 DIAGNOSIS — O99891 Other specified diseases and conditions complicating pregnancy: Secondary | ICD-10-CM | POA: Insufficient documentation

## 2011-08-11 DIAGNOSIS — J069 Acute upper respiratory infection, unspecified: Secondary | ICD-10-CM | POA: Insufficient documentation

## 2011-08-11 LAB — POCT I-STAT, CHEM 8
BUN: 5 — ABNORMAL LOW
Calcium, Ion: 1.18
Chloride: 101
Creatinine, Ser: 1.1
Glucose, Bld: 94
Hemoglobin: 14.6
Sodium: 134 — ABNORMAL LOW
TCO2: 23

## 2011-08-11 LAB — URINALYSIS, ROUTINE W REFLEX MICROSCOPIC
Glucose, UA: NEGATIVE
Ketones, ur: NEGATIVE
Protein, ur: NEGATIVE
Specific Gravity, Urine: 1.008

## 2011-08-11 LAB — BASIC METABOLIC PANEL WITH GFR
BUN: 4 — ABNORMAL LOW
CO2: 26
Calcium: 8.9
Chloride: 103
Creatinine, Ser: 0.75
GFR calc non Af Amer: >60
Glucose, Bld: 78
Potassium: 3.2 — ABNORMAL LOW
Sodium: 138

## 2011-08-11 LAB — CBC
HCT: 34.4 — ABNORMAL LOW
HCT: 38.6
HCT: 39.8
MCV: 80.9
MCV: 81.4
Platelets: 263
Platelets: 280
RBC: 4.77
RBC: 4.89
RDW: 16.9 — ABNORMAL HIGH
WBC: 7.9
WBC: 8.8

## 2011-08-11 LAB — LIPID PANEL
HDL: 49
Triglycerides: 66
VLDL: 13

## 2011-08-11 LAB — URINE MICROSCOPIC-ADD ON

## 2011-08-11 LAB — DIFFERENTIAL
Eosinophils Absolute: 0
Eosinophils Relative: 0
Eosinophils Relative: 1
Lymphocytes Relative: 13
Lymphs Abs: 1
Lymphs Abs: 1.5
Monocytes Absolute: 0.8
Monocytes Relative: 5
Monocytes Relative: 9

## 2011-08-11 LAB — EXPECTORATED SPUTUM ASSESSMENT W GRAM STAIN, RFLX TO RESP C

## 2011-08-11 LAB — CULTURE, RESPIRATORY W GRAM STAIN: Culture: NORMAL

## 2011-08-11 LAB — CULTURE, BLOOD (ROUTINE X 2)

## 2011-08-11 LAB — D-DIMER, QUANTITATIVE: D-Dimer, Quant: 0.5 — ABNORMAL HIGH

## 2011-08-11 LAB — T4, FREE: Free T4: 0.97

## 2011-08-11 MED ORDER — GUAIFENESIN 100 MG/5ML PO LIQD: 100.0000 mg | ORAL | Status: AC | PRN

## 2011-08-11 MED ORDER — FEXOFENADINE HCL 60 MG PO TABS: 60.0000 mg | ORAL_TABLET | Freq: Two times a day (BID) | ORAL | Status: DC

## 2011-08-11 MED ORDER — DEXTROMETHORPHAN POLISTIREX 30 MG/5ML PO LQCR: 60.0000 mg | ORAL | Status: AC | PRN

## 2011-08-11 MED ORDER — AZITHROMYCIN 250 MG PO TABS: ORAL_TABLET | ORAL | Status: AC

## 2011-08-11 NOTE — Progress Notes (Signed)
Hx of asthma and pneumonia, chest congestion/cough gets really bad at night. Productive cough- light yellow.

## 2011-08-11 NOTE — Progress Notes (Signed)
Pt c/o productive cough at night with thick yellow mucus. No fever. Pt taking nothing for cough.

## 2011-08-11 NOTE — ED Provider Notes (Signed)
History     Chief Complaint  Patient presents with  . Nasal Congestion   HPI  Pt is 38weeks 2 days pregnant and presents with cough and nasal congestion for 2 days.  Pt states that she is having green nasal drainage and productive cough with yellow/green sputum.  She has been nauseated from the drainage.  She has a history of double pneumonia and has history of asthma controlled with Albuterol inhaler.  Pt denies fever, chills, ear pain, wheezing.  She has been using her inhaler but no other meds for her symptoms.  Pt is not having contractions and denies vaginal bleeding or vaginal discharge  Past Medical History  Diagnosis Date  . Asthma     Past Surgical History  Procedure Date  . No past surgeries     No family history on file.  History  Substance Use Topics  . Smoking status: Current Everyday Smoker -- 0.2 packs/day  . Smokeless tobacco: Not on file  . Alcohol Use: No    Allergies: Allergies not on file  No prescriptions prior to admission    Review of Systems  Constitutional: Negative.  Negative for fever and chills.  Respiratory: Positive for cough, sputum production and shortness of breath.   Cardiovascular: Negative for chest pain.  Gastrointestinal: Positive for nausea.   Physical Exam   Blood pressure 126/84, pulse 128, temperature 98.4 F (36.9 C), temperature source Oral, resp. rate 24, height 5\' 8"  (1.727 m), weight 260 lb 6.4 oz (118.117 kg), SpO2 98.00%.  Physical Exam  Vitals reviewed. Constitutional: She is oriented to person, place, and time. She appears well-developed and well-nourished.  Neck: Normal range of motion.  Respiratory: Effort normal and breath sounds normal. She has no wheezes. She has no rales.  GI: Soft.       FHR reactive; no ctx noted  Musculoskeletal: Normal range of motion.  Neurological: She is alert and oriented to person, place, and time.  Skin: Skin is warm and dry.  Psychiatric: She has a normal mood and affect.     MAU Course  Procedures     Assessment and Plan  [redacted]w[redacted]d pregnant with URI Asthma- continue inhaler prn, presciptions for Z pack, Delsym, Allegra F/u with Dr. Tamela Oddi  [facility] 08/11/2011, 1:31 PM

## 2011-08-17 LAB — POCT I-STAT, CHEM 8
Creatinine, Ser: 1.1
HCT: 42
Hemoglobin: 14.3
Potassium: 3.8
Sodium: 141

## 2011-08-17 LAB — PREGNANCY, URINE: Preg Test, Ur: NEGATIVE

## 2011-08-17 LAB — CBC
MCV: 83.2
Platelets: 281
WBC: 11.8 — ABNORMAL HIGH

## 2011-08-17 LAB — RAPID URINE DRUG SCREEN, HOSP PERFORMED
Amphetamines: NOT DETECTED
Barbiturates: NOT DETECTED

## 2011-08-17 LAB — ETHANOL: Alcohol, Ethyl (B): <5

## 2011-08-17 LAB — SAMPLE TO BLOOD BANK

## 2011-08-24 LAB — CBC
Hemoglobin: 12.6
MCHC: 33.1
MCV: 81.8
Platelets: 300
RBC: 4.64
RBC: 4.93
WBC: 7.1
WBC: 7.6

## 2011-08-24 LAB — COMPREHENSIVE METABOLIC PANEL
ALT: 20
AST: 24
Albumin: 3.5
CO2: 21
Chloride: 107
GFR calc Af Amer: >60
GFR calc non Af Amer: >60
Potassium: 3.2 — ABNORMAL LOW
Sodium: 139
Total Bilirubin: 0.6

## 2011-08-24 LAB — POCT I-STAT 3, ART BLOOD GAS (G3+)
Bicarbonate: 20.1
Operator id: 296031
pH, Arterial: 7.447 — ABNORMAL HIGH

## 2011-08-24 LAB — BASIC METABOLIC PANEL
CO2: 21
Calcium: 9.3
Chloride: 114 — ABNORMAL HIGH
GFR calc Af Amer: >60
Sodium: 142

## 2011-08-24 LAB — GC/CHLAMYDIA PROBE AMP, URINE
Chlamydia, Swab/Urine, PCR: NEGATIVE
GC Probe Amp, Urine: NEGATIVE

## 2011-08-24 LAB — DIFFERENTIAL: Basophils Relative: 1

## 2011-08-24 LAB — D-DIMER, QUANTITATIVE: D-Dimer, Quant: 0.43

## 2011-08-26 ENCOUNTER — Encounter (HOSPITAL_COMMUNITY): Payer: Self-pay

## 2011-08-26 ENCOUNTER — Encounter (HOSPITAL_COMMUNITY): Payer: Self-pay | Admitting: Anesthesiology

## 2011-08-26 ENCOUNTER — Inpatient Hospital Stay (HOSPITAL_COMMUNITY)
Admission: AD | Admit: 2011-08-26 | Discharge: 2011-08-29 | DRG: 775 | Disposition: A | Discharge status: Home / Self Care | Payer: Medicaid Other | Source: Ambulatory Visit | Attending: Obstetrics & Gynecology | Admitting: Obstetrics & Gynecology

## 2011-08-26 ENCOUNTER — Inpatient Hospital Stay (HOSPITAL_COMMUNITY): Payer: Medicaid Other | Admitting: Anesthesiology

## 2011-08-26 LAB — CBC
HCT: 42.5 % (ref 36.0–46.0)
MCV: 88.2 fL (ref 78.0–100.0)
Platelets: 336 10*3/uL (ref 150–400)
RBC: 4.82 MIL/uL (ref 3.87–5.11)
RDW: 14.3 % (ref 11.5–15.5)
WBC: 10.7 10*3/uL — ABNORMAL HIGH (ref 4.0–10.5)

## 2011-08-26 MED ORDER — BUPIVACAINE HCL (PF) 0.25 % IJ SOLN
INTRAMUSCULAR | Status: DC | PRN
  Administered 2011-08-26 (×2): 5 mL via EPIDURAL

## 2011-08-26 MED ORDER — ONDANSETRON HCL 4 MG/2ML IJ SOLN
4.0000 mg | Freq: Once | INTRAMUSCULAR | Status: AC
  Administered 2011-08-26: 4 mg via INTRAVENOUS

## 2011-08-26 MED ORDER — LACTATED RINGERS IV SOLN
INTRAVENOUS | Status: DC
  Administered 2011-08-26 (×2): via INTRAVENOUS

## 2011-08-26 MED ORDER — FLEET ENEMA 7-19 GM/118ML RE ENEM: 1.0000 | ENEMA | RECTAL | Status: DC | PRN

## 2011-08-26 MED ORDER — FENTANYL CITRATE 0.05 MG/ML IJ SOLN
100.0000 ug | INTRAMUSCULAR | Status: DC | PRN
  Administered 2011-08-26 (×2): 100 ug via INTRAVENOUS
  Filled 2011-08-26: qty 2

## 2011-08-26 MED ORDER — FENTANYL 2.5 MCG/ML BUPIVACAINE 1/10 % EPIDURAL INFUSION (WH - ANES)
14.0000 mL/h | INTRAMUSCULAR | Status: DC
  Administered 2011-08-26 (×4): 14 mL/h via EPIDURAL
  Filled 2011-08-26 (×4): qty 60

## 2011-08-26 MED ORDER — LIDOCAINE HCL (PF) 1 % IJ SOLN
30.0000 mL | INTRAMUSCULAR | Status: AC | PRN
  Administered 2011-08-27: 30 mL via SUBCUTANEOUS
  Filled 2011-08-26: qty 30

## 2011-08-26 MED ORDER — LIDOCAINE HCL 1.5 % IJ SOLN
INTRAMUSCULAR | Status: DC | PRN
  Administered 2011-08-26: 2 mL via INTRADERMAL
  Administered 2011-08-26 (×2): 5 mL via INTRADERMAL

## 2011-08-26 MED ORDER — IBUPROFEN 600 MG PO TABS: 600.0000 mg | ORAL_TABLET | Freq: Four times a day (QID) | ORAL | Status: DC | PRN

## 2011-08-26 MED ORDER — EPHEDRINE 5 MG/ML INJ
10.0000 mg | INTRAVENOUS | Status: DC | PRN
  Filled 2011-08-26 (×2): qty 4

## 2011-08-26 MED ORDER — FAMOTIDINE 20 MG PO TABS
20.0000 mg | ORAL_TABLET | Freq: Two times a day (BID) | ORAL | Status: DC | PRN
  Administered 2011-08-26: 20 mg via ORAL
  Filled 2011-08-26: qty 1

## 2011-08-26 MED ORDER — CITRIC ACID-SODIUM CITRATE 334-500 MG/5ML PO SOLN
30.0000 mL | ORAL | Status: DC | PRN
  Filled 2011-08-26: qty 15

## 2011-08-26 MED ORDER — OXYTOCIN 20 UNITS IN LACTATED RINGERS INFUSION - SIMPLE: 125.0000 mL/h | Freq: Once | INTRAVENOUS | Status: DC

## 2011-08-26 MED ORDER — TERBUTALINE SULFATE 1 MG/ML IJ SOLN: 0.2500 mg | Freq: Once | INTRAMUSCULAR | Status: AC | PRN

## 2011-08-26 MED ORDER — LACTATED RINGERS IV SOLN: 500.0000 mL | INTRAVENOUS | Status: DC | PRN

## 2011-08-26 MED ORDER — OXYTOCIN BOLUS FROM INFUSION
500.0000 mL | Freq: Once | INTRAVENOUS | Status: DC
  Filled 2011-08-26: qty 500
  Filled 2011-08-26: qty 1000

## 2011-08-26 MED ORDER — ZOLPIDEM TARTRATE 10 MG PO TABS: 10.0000 mg | ORAL_TABLET | Freq: Every evening | ORAL | Status: DC | PRN

## 2011-08-26 MED ORDER — EPHEDRINE 5 MG/ML INJ
10.0000 mg | INTRAVENOUS | Status: DC | PRN
  Filled 2011-08-26: qty 4

## 2011-08-26 MED ORDER — DIPHENHYDRAMINE HCL 50 MG/ML IJ SOLN: 12.5000 mg | INTRAMUSCULAR | Status: DC | PRN

## 2011-08-26 MED ORDER — FENTANYL CITRATE 0.05 MG/ML IJ SOLN
INTRAMUSCULAR | Status: AC
  Administered 2011-08-26: 100 ug via INTRAVENOUS
  Filled 2011-08-26: qty 2

## 2011-08-26 MED ORDER — LACTATED RINGERS IV SOLN: 500.0000 mL | Freq: Once | INTRAVENOUS | Status: DC

## 2011-08-26 MED ORDER — ONDANSETRON HCL 4 MG/2ML IJ SOLN
4.0000 mg | Freq: Four times a day (QID) | INTRAMUSCULAR | Status: DC | PRN
  Administered 2011-08-26: 4 mg via INTRAVENOUS
  Filled 2011-08-26 (×2): qty 2

## 2011-08-26 MED ORDER — OXYTOCIN 20 UNITS IN LACTATED RINGERS INFUSION - SIMPLE
1.0000 m[IU]/min | INTRAVENOUS | Status: DC
  Administered 2011-08-26: 4 m[IU]/min via INTRAVENOUS
  Administered 2011-08-26: 6 m[IU]/min via INTRAVENOUS
  Administered 2011-08-26: 2 m[IU]/min via INTRAVENOUS

## 2011-08-26 MED ORDER — FENTANYL 2.5 MCG/ML BUPIVACAINE 1/10 % EPIDURAL INFUSION (WH - ANES)
INTRAMUSCULAR | Status: DC | PRN
Start: 2011-08-26 — End: 2011-08-31
  Administered 2011-08-26: 14 mL/h via EPIDURAL

## 2011-08-26 MED ORDER — PHENYLEPHRINE 40 MCG/ML (10ML) SYRINGE FOR IV PUSH (FOR BLOOD PRESSURE SUPPORT)
80.0000 ug | PREFILLED_SYRINGE | INTRAVENOUS | Status: DC | PRN
  Filled 2011-08-26 (×2): qty 5

## 2011-08-26 MED ORDER — PHENYLEPHRINE 40 MCG/ML (10ML) SYRINGE FOR IV PUSH (FOR BLOOD PRESSURE SUPPORT)
80.0000 ug | PREFILLED_SYRINGE | INTRAVENOUS | Status: DC | PRN
  Filled 2011-08-26: qty 5

## 2011-08-26 NOTE — Progress Notes (Signed)
Asked Breanna [name], RNC (charge) to monitor pt while I eat lunch.

## 2011-08-26 NOTE — Progress Notes (Signed)
Pt states ctx's q3-5 minutes apart since 0500 this am, denies lof, lost mucous plug, has bloody show, +FM.

## 2011-08-26 NOTE — Progress Notes (Signed)
Breanna [name] is a 25 y.o. G1P0 at [redacted]w[redacted]d by LMP admitted for active labor  Subjective:   Objective: BP 135/72  Pulse 85  Temp(Src) 98.9 F (37.2 C) (Oral)  Resp 20  Ht 5' 8.5" (1.74 m)  Wt 119.296 kg (263 lb)  BMI 39.41 kg/m2  SpO2 78%      FHT:  FHR: 140 bpm, variability: moderate,  accelerations:  Present,  decelerations:  Absent UC:   irregular, every 5 minutes SVE:   Dilation: 7 Effacement (%): 90 Station: -1 Exam by:: Montez Hageman, RN   Labs: Lab Results  Component Value Date   WBC 10.7* 08/26/2011   HGB 14.4 08/26/2011   HCT 42.5 08/26/2011   MCV 88.2 08/26/2011   PLT 336 08/26/2011    Assessment / Plan: Protracted latent phase  Labor: augment w/Pitocin Preeclampsia:  N/A Fetal Wellbeing:  Category I Pain Control:  Epidural I/D:  n/a Anticipated MOD:  NSVD  JACKSON-MOORE,Markavious Micco A 08/26/2011, 7:59 PM

## 2011-08-26 NOTE — Progress Notes (Signed)
Assisted pt out of tub.  Vomiting & requesting an epidural now.  Will resume FHR once pt is in bed.

## 2011-08-26 NOTE — Anesthesia Preprocedure Evaluation (Signed)
Anesthesia Evaluation  Name, MR# and DOB Patient awake  General Assessment Comment  Reviewed: Allergy & Precautions, H&P , NPO status , Patient's Chart, lab work & pertinent test results, reviewed documented beta blocker date and time   History of Anesthesia Complications Negative for: history of anesthetic complications  Airway Mallampati: II TM Distance: >3 FB Neck ROM: full    Dental  (+) Teeth Intact   Pulmonary asthma (last inh use 1 mo ago during a cold)  clear to auscultation        Cardiovascular regular Normal    Neuro/Psych Negative Neurological ROS  Negative Psych ROS   GI/Hepatic negative GI ROS Neg liver ROS    Endo/Other  Morbid obesity  Renal/GU negative Renal ROS     Musculoskeletal   Abdominal   Peds  Hematology negative hematology ROS (+)   Anesthesia Other Findings   Reproductive/Obstetrics (+) Pregnancy                           Anesthesia Physical Anesthesia Plan  ASA: III  Anesthesia Plan: Epidural   Post-op Pain Management:    Induction:   Airway Management Planned:   Additional Equipment:   Intra-op Plan:   Post-operative Plan:   Informed Consent: I have reviewed the patients History and Physical, chart, labs and discussed the procedure including the risks, benefits and alternatives for the proposed anesthesia with the patient or authorized representative who has indicated his/her understanding and acceptance.     Plan Discussed with:   Anesthesia Plan Comments:         Anesthesia Quick Evaluation

## 2011-08-26 NOTE — Anesthesia Procedure Notes (Addendum)
Epidural Patient location during procedure: OB Start time: 08/26/2011 2:47 PM Reason for block: procedure for pain  Staffing Performed by: anesthesiologist   Preanesthetic Checklist Completed: patient identified, site marked, surgical consent, pre-op evaluation, timeout performed, IV checked, risks and benefits discussed and monitors and equipment checked  Epidural Patient position: sitting Prep: site prepped and draped and DuraPrep Patient monitoring: continuous pulse ox and blood pressure Approach: midline Injection technique: LOR air  Needle:  Needle type: Tuohy  Needle gauge: 17 G Needle length: 9 cm Needle insertion depth: 6 cm Catheter type: closed end flexible Catheter size: 19 Gauge Catheter at skin depth: 11 cm Test dose: negative  Assessment Events: blood not aspirated, injection not painful, no injection resistance, negative IV test and no paresthesia  Additional Notes Discussed risk of headache, infection, bleeding, nerve injury and failed or incomplete block.  Patient voices understanding and wishes to proceed.   Epidural Patient location during procedure: OB Start time: 08/26/2011 7:41 PM End time: 08/26/2011 7:46 PM Reason for block: procedure for pain  Staffing Anesthesiologist: Sandrea Hughs  Preanesthetic Checklist Completed: patient identified, surgical consent, pre-op evaluation, timeout performed, IV checked, risks and benefits discussed and monitors and equipment checked  Epidural Patient position: sitting Prep: DuraPrep Patient monitoring: continuous pulse ox, blood pressure and heart rate Approach: midline Injection technique: LOR air  Needle:  Needle type: Tuohy  Needle gauge: 17 G Needle length: 9 cm Needle insertion depth: 6 cm Catheter type: closed end flexible Catheter size: 20 Guage Test dose: negative and 1.5% lidocaine  Assessment Sensory level: T8

## 2011-08-26 NOTE — Plan of Care (Signed)
Dr. Tamela Oddi notified of pt's cervical exam, c/o ctx's q3-5 minutes apart. Orders to admit, notified MD pt to be held until available bed on [facility], allergy to morphine/percocet, orders for fentanyl obtained.

## 2011-08-26 NOTE — H&P (Signed)
Breanna [name] is a 25 y.o. female presenting for contractions. Maternal Medical History:  Reason for admission: Reason for admission: contractions.  Reason for Admission:   nauseaContractions: Frequency: regular.    Fetal activity: Perceived fetal activity is normal.    Prenatal complications: no prenatal complications   OB History    Grav Para Term Preterm Abortions TAB SAB Ect Mult Living   1              Past Medical History  Diagnosis Date  . Asthma    Past Surgical History  Procedure Date  . No past surgeries   . Wisdom tooth extraction    Family History: family history is not on file. Social History:  reports that she has been smoking.  She has never used smokeless tobacco. She reports that she does not drink alcohol or use illicit drugs.  Review of Systems  Constitutional: Negative for fever.  Eyes: Negative for blurred vision.  Respiratory: Negative for shortness of breath.   Gastrointestinal: Negative for nausea and vomiting.  Skin: Negative for rash.  Neurological: Negative for headaches.    Dilation: 4 Effacement (%): 90 Station: -3 Exam by:: Dorrene German RN Blood pressure 134/89, pulse 76, temperature 97.9 F (36.6 C), temperature source Oral, resp. rate 16, height 5' 8.5" (1.74 m), weight 119.296 kg (263 lb), SpO2 97.00%. Maternal Exam:  Abdomen: Patient reports no abdominal tenderness. Introitus: not evaluated.     Fetal Exam Fetal Monitor Review: Variability: moderate (6-25 bpm).   Pattern: accelerations present and no decelerations.    Fetal State Assessment: Category I - tracings are normal.     Physical Exam  Constitutional: She appears well-developed.  HENT:  Head: Normocephalic.  Neck: Neck supple. No thyromegaly present.  Cardiovascular: Normal rate and regular rhythm.   Respiratory: Breath sounds normal.  GI: Soft. Bowel sounds are normal.  Skin: No rash noted.    Prenatal labs: ABO, Rh: --/--/O POS (03/30 1830) Antibody:     Rubella:   RPR:    HBsAg:    HIV:    GBS:     Assessment/Plan: 25 y.o.  W/an IUP [redacted]w[redacted]d in early labor  Admit Monitor progress  Anticipate SVD   JACKSON-MOORE,Jeri Rawlins A 08/26/2011, 1:44 PM

## 2011-08-27 ENCOUNTER — Encounter (HOSPITAL_COMMUNITY): Payer: Self-pay | Admitting: *Deleted

## 2011-08-27 LAB — CBC
HCT: 31.5 % — ABNORMAL LOW (ref 36.0–46.0)
Hemoglobin: 10.8 g/dL — ABNORMAL LOW (ref 12.0–15.0)
MCH: 30.4 pg (ref 26.0–34.0)
RBC: 3.55 MIL/uL — ABNORMAL LOW (ref 3.87–5.11)

## 2011-08-27 MED ORDER — IBUPROFEN 600 MG PO TABS
600.0000 mg | ORAL_TABLET | Freq: Four times a day (QID) | ORAL | Status: DC
  Administered 2011-08-27 – 2011-08-29 (×10): 600 mg via ORAL
  Filled 2011-08-27 (×11): qty 1

## 2011-08-27 MED ORDER — ONDANSETRON HCL 4 MG/2ML IJ SOLN: 4.0000 mg | INTRAMUSCULAR | Status: DC | PRN

## 2011-08-27 MED ORDER — BENZOCAINE-MENTHOL 20-0.5 % EX AERO
INHALATION_SPRAY | CUTANEOUS | Status: AC
  Filled 2011-08-27: qty 56

## 2011-08-27 MED ORDER — DIBUCAINE 1 % RE OINT: 1.0000 "application " | TOPICAL_OINTMENT | RECTAL | Status: DC | PRN

## 2011-08-27 MED ORDER — DIPHENHYDRAMINE HCL 25 MG PO CAPS: 25.0000 mg | ORAL_CAPSULE | Freq: Four times a day (QID) | ORAL | Status: DC | PRN

## 2011-08-27 MED ORDER — LANOLIN HYDROUS EX OINT: TOPICAL_OINTMENT | CUTANEOUS | Status: DC | PRN

## 2011-08-27 MED ORDER — MEDROXYPROGESTERONE ACETATE 150 MG/ML IM SUSP: 150.0000 mg | INTRAMUSCULAR | Status: DC | PRN

## 2011-08-27 MED ORDER — ONDANSETRON HCL 4 MG PO TABS: 4.0000 mg | ORAL_TABLET | ORAL | Status: DC | PRN

## 2011-08-27 MED ORDER — SENNOSIDES-DOCUSATE SODIUM 8.6-50 MG PO TABS
2.0000 | ORAL_TABLET | Freq: Every day | ORAL | Status: DC
  Administered 2011-08-27 – 2011-08-28 (×2): 2 via ORAL

## 2011-08-27 MED ORDER — FERROUS SULFATE 325 (65 FE) MG PO TABS
325.0000 mg | ORAL_TABLET | Freq: Two times a day (BID) | ORAL | Status: DC
  Administered 2011-08-27 – 2011-08-29 (×5): 325 mg via ORAL
  Filled 2011-08-27 (×5): qty 1

## 2011-08-27 MED ORDER — TETANUS-DIPHTH-ACELL PERTUSSIS 5-2.5-18.5 LF-MCG/0.5 IM SUSP
0.5000 mL | Freq: Once | INTRAMUSCULAR | Status: AC
  Administered 2011-08-28: 0.5 mL via INTRAMUSCULAR
  Filled 2011-08-27: qty 0.5

## 2011-08-27 MED ORDER — PRENATAL PLUS 27-1 MG PO TABS
1.0000 | ORAL_TABLET | Freq: Every day | ORAL | Status: DC
  Administered 2011-08-27 – 2011-08-29 (×3): 1 via ORAL
  Filled 2011-08-27 (×3): qty 1

## 2011-08-27 MED ORDER — BENZOCAINE-MENTHOL 20-0.5 % EX AERO
1.0000 "application " | INHALATION_SPRAY | CUTANEOUS | Status: DC | PRN
  Administered 2011-08-27: 1 via TOPICAL

## 2011-08-27 MED ORDER — MAGNESIUM HYDROXIDE 400 MG/5ML PO SUSP: 30.0000 mL | ORAL | Status: DC | PRN

## 2011-08-27 MED ORDER — MEPERIDINE HCL 50 MG/ML IJ SOLN
50.0000 mg | INTRAMUSCULAR | Status: DC | PRN
  Administered 2011-08-27 – 2011-08-28 (×2): 50 mg via INTRAMUSCULAR
  Filled 2011-08-27 (×2): qty 2

## 2011-08-27 MED ORDER — WITCH HAZEL-GLYCERIN EX PADS: 1.0000 "application " | MEDICATED_PAD | CUTANEOUS | Status: DC | PRN

## 2011-08-27 MED ORDER — ZOLPIDEM TARTRATE 5 MG PO TABS: 5.0000 mg | ORAL_TABLET | Freq: Every evening | ORAL | Status: DC | PRN

## 2011-08-27 MED ORDER — MEASLES, MUMPS & RUBELLA VAC ~~LOC~~ INJ
0.5000 mL | INJECTION | Freq: Once | SUBCUTANEOUS | Status: DC
  Filled 2011-08-27: qty 0.5

## 2011-08-27 NOTE — Progress Notes (Signed)
  Post Partum Day 0 S/P spontaneous vaginal RH status/Rubella reviewed.  Feeding: unknown Subjective: No HA, SOB, CP, F/C, breast symptoms. Normal vaginal bleeding, no clots.     Objective: BP 108/68  Pulse 77  Temp(Src) 98.2 F (36.8 C) (Oral)  Resp 20  Ht 5' 8.5" (1.74 m)  Wt 119.296 kg (263 lb)  BMI 39.41 kg/m2  SpO2 99%   Physical Exam:  General: alert Lochia: appropriate Uterine Fundus: firm DVT Evaluation: No evidence of DVT seen on physical exam. Ext: No c/c/e  Basename 08/27/11 0530 08/26/11 0951  HGB 10.8* 14.4  HCT 31.5* 42.5      Assessment/Plan: 25 y.o.  PPD #0 .  normal postpartum exam Continue current postpartum care Social Services consult--h/o depression Ambulate   LOS: 1 day   JACKSON-MOORE,Topher Buenaventura A 08/27/2011, 1:55 PM

## 2011-08-27 NOTE — Progress Notes (Signed)
UR Chart review completed.  

## 2011-08-27 NOTE — Progress Notes (Signed)

## 2011-08-28 NOTE — Progress Notes (Signed)
Post Partum Day 1 S/P spontaneous vaginal RH status/Rubella reviewed.  Feeding: unknown Subjective: No HA, SOB, CP, F/C, breast symptoms. Normal vaginal bleeding, no clots.     Objective: BP 111/73  Pulse 75  Temp(Src) 98.1 F (36.7 C) (Oral)  Resp 18  Ht 5' 8.5" (1.74 m)  Wt 119.296 kg (263 lb)  BMI 39.41 kg/m2  SpO2 99%  Breastfeeding? Unknown   Physical Exam:  General: alert Lochia: appropriate Uterine Fundus: firm DVT Evaluation: No evidence of DVT seen on physical exam. Ext: No c/c/e  Basename 08/27/11 0530 08/26/11 0951  HGB 10.8* 14.4  HCT 31.5* 42.5      Assessment/Plan: 25 y.o.  PPD #1 .  normal postpartum exam Continue current postpartum care  Ambulate   LOS: 2 days   JACKSON-MOORE,Boyce Keltner A 08/28/2011, 8:35 AM

## 2011-08-29 MED ORDER — HYDROMORPHONE HCL 2 MG PO TABS: 2.0000 mg | ORAL_TABLET | ORAL | Status: AC | PRN

## 2011-08-29 MED ORDER — BENZOCAINE-MENTHOL 20-0.5 % EX AERO
INHALATION_SPRAY | CUTANEOUS | Status: AC
  Administered 2011-08-29: 14:00:00
  Filled 2011-08-29: qty 56

## 2011-08-29 MED ORDER — NORETHINDRONE 0.35 MG PO TABS: 1.0000 | ORAL_TABLET | Freq: Every day | ORAL | Status: DC

## 2011-08-29 NOTE — Discharge Summary (Signed)
  Obstetric Discharge Summary Reason for Admission: onset of labor Prenatal Procedures: none Intrapartum Procedures: spontaneous vaginal delivery Postpartum Procedures: none Complications-Operative and Postpartum: none  Hemoglobin  Date Value Range Status  08/27/2011 10.8* 12.0-15.0 (g/dL) Final     DELTA CHECK NOTED     REPEATED TO VERIFY     HCT  Date Value Range Status  08/27/2011 31.5* 36.0-46.0 (%) Final    Discharge Diagnoses: Term Pregnancy-delivered  Discharge Information: Date: 08/29/2011 Activity: pelvic rest Diet: routine Medications: Ibuprofen, Dilaudid, Micronor, PNV Condition: stable Instructions: refer to routine discharge instructions Discharge to: home Follow-up Information    Follow up with Antionette Char A, MD. Call in 2 weeks.   Contact information:   [address], [address] [place] [address] [phone]          Newborn Data: Live born  Information for the patient's newborn:  DORLIS, JUDICE [[id number]]  female ; APGAR , ; weight ;  Home with mother.  JACKSON-MOORE,Nehemias Sauceda A 08/29/2011, 10:23 AM

## 2011-08-29 NOTE — Progress Notes (Signed)
Post Partum Day #2 S/P:spontaneous vaginal  RH status/Rubella reviewed.  Feeding: breast Subjective: No HA, SOB, CP, F/C, breast symptoms: No. Normal vaginal bleeding, no clots.     Objective:  Blood pressure 120/83, pulse 90, temperature 98.5 F (36.9 C), temperature source Oral, resp. rate 18, height 5' 8.5" (1.74 m), weight 119.296 kg (263 lb), SpO2 99.00%, unknown if currently breastfeeding.   Physical Exam:  General: alert Lochia: appropriate Uterine Fundus: firm DVT Evaluation: No evidence of DVT seen on physical exam. Ext: No c/c/e  Basename 08/27/11 0530  HGB 10.8*  HCT 31.5*    Assessment/Plan: 25 y.o.  PPD # 2 .  normal postpartum exam Continue current postpartum care D/C home   LOS: 3 days   [name],Breanna Walpole A 08/29/2011, 10:18 AM

## 2011-08-31 NOTE — Anesthesia Postprocedure Evaluation (Signed)
Patient stable following vaginal delivery.  

## 2012-03-15 ENCOUNTER — Encounter (HOSPITAL_COMMUNITY): Payer: Self-pay | Admitting: *Deleted

## 2012-03-15 ENCOUNTER — Emergency Department (HOSPITAL_COMMUNITY)
Admission: EM | Admit: 2012-03-15 | Discharge: 2012-03-15 | Disposition: A | Discharge status: Home / Self Care | Payer: Medicaid Other | Attending: Emergency Medicine | Admitting: Emergency Medicine

## 2012-03-15 DIAGNOSIS — S335XXA Sprain of ligaments of lumbar spine, initial encounter: Secondary | ICD-10-CM | POA: Insufficient documentation

## 2012-03-15 DIAGNOSIS — S39012A Strain of muscle, fascia and tendon of lower back, initial encounter: Secondary | ICD-10-CM

## 2012-03-15 DIAGNOSIS — X500XXA Overexertion from strenuous movement or load, initial encounter: Secondary | ICD-10-CM | POA: Insufficient documentation

## 2012-03-15 DIAGNOSIS — M545 Low back pain, unspecified: Secondary | ICD-10-CM | POA: Insufficient documentation

## 2012-03-15 DIAGNOSIS — F17200 Nicotine dependence, unspecified, uncomplicated: Secondary | ICD-10-CM | POA: Insufficient documentation

## 2012-03-15 MED ORDER — HYDROCODONE-ACETAMINOPHEN 5-325 MG PO TABS: 1.0000 | ORAL_TABLET | Freq: Four times a day (QID) | ORAL | Status: AC | PRN

## 2012-03-15 MED ORDER — KETOROLAC TROMETHAMINE 60 MG/2ML IM SOLN
60.0000 mg | Freq: Once | INTRAMUSCULAR | Status: AC
  Administered 2012-03-15: 60 mg via INTRAMUSCULAR
  Filled 2012-03-15: qty 2

## 2012-03-15 MED ORDER — NAPROXEN 500 MG PO TABS: 500.0000 mg | ORAL_TABLET | Freq: Two times a day (BID) | ORAL | Status: DC

## 2012-03-15 NOTE — ED Provider Notes (Signed)
History     CSN: [id number]  Arrival date & time 03/15/12  1138   First MD Initiated Contact with Patient 03/15/12 1149      Chief Complaint  Patient presents with  . Back Pain    (Consider location/radiation/quality/duration/timing/severity/associated sxs/prior treatment) Patient is a 26 y.o. female presenting with back pain. The history is provided by the patient (the pt complains of right lower back pain with movement). No language interpreter was used.  Back Pain  This is a new problem. The current episode started more than 2 days ago. The problem occurs constantly. The problem has not changed since onset.Associated with: hurt back lifting. The pain is present in the lumbar spine. The quality of the pain is described as stabbing. The pain does not radiate. The pain is at a severity of 6/10. The pain is moderate. The symptoms are aggravated by bending. Stiffness is present all day. Pertinent negatives include no chest pain, no headaches and no abdominal pain. She has tried NSAIDs for the symptoms. The treatment provided no relief. Risk factors include obesity.    Past Medical History  Diagnosis Date  . Asthma     Past Surgical History  Procedure Date  . No past surgeries   . Wisdom tooth extraction     No family history on file.  History  Substance Use Topics  . Smoking status: Current Everyday Smoker -- 0.2 packs/day  . Smokeless tobacco: Never Used  . Alcohol Use: No    OB History    Grav Para Term Preterm Abortions TAB SAB Ect Mult Living   1 1 1       1       Review of Systems  Constitutional: Negative for fatigue.  HENT: Negative for congestion, sinus pressure and ear discharge.   Eyes: Negative for discharge.  Respiratory: Negative for cough.   Cardiovascular: Negative for chest pain.  Gastrointestinal: Negative for abdominal pain and diarrhea.  Genitourinary: Negative for frequency and hematuria.  Musculoskeletal: Positive for back pain.  Skin: Negative  for rash.  Neurological: Negative for seizures and headaches.  Hematological: Negative.   Psychiatric/Behavioral: Negative for hallucinations.    Allergies  Morphine and related and Percocet  Home Medications   Current Outpatient Rx  Name Route Sig Dispense Refill  . ALBUTEROL SULFATE HFA 108 (90 BASE) MCG/ACT IN AERS Inhalation Inhale 2 puffs into the lungs every 6 (six) hours as needed. Patient takes for shortness of breath     . IBUPROFEN 200 MG PO TABS Oral Take 400 mg by mouth every 6 (six) hours as needed. For pain    . HYDROCODONE-ACETAMINOPHEN 5-325 MG PO TABS Oral Take 1 tablet by mouth every 6 (six) hours as needed for pain. 20 tablet 0  . NAPROXEN 500 MG PO TABS Oral Take 1 tablet (500 mg total) by mouth 2 (two) times daily. 30 tablet 0    BP 133/75  Pulse 70  Temp(Src) 97.5 F (36.4 C) (Oral)  Resp 16  SpO2 97%  Breastfeeding? Unknown  Physical Exam  Constitutional: She is oriented to person, place, and time. She appears well-developed.  HENT:  Head: Normocephalic and atraumatic.  Eyes: Conjunctivae and EOM are normal. No scleral icterus.  Neck: Neck supple. No thyromegaly present.  Cardiovascular: Normal rate and regular rhythm.  Exam reveals no gallop and no friction rub.   No murmur heard. Pulmonary/Chest: No stridor. She has no wheezes. She has no rales. She exhibits no tenderness.  Abdominal: She exhibits no  distension. There is no tenderness. There is no rebound.  Musculoskeletal: She exhibits no edema.       Tender right lumbar spine.  Neg straight leg raise.  Neuro exam normal in extr.  Lymphadenopathy:    She has no cervical adenopathy.  Neurological: She is oriented to person, place, and time. Coordination normal.  Skin: No rash noted. No erythema.  Psychiatric: She has a normal mood and affect. Her behavior is normal.    ED Course  Procedures (including critical care time)  Labs Reviewed - No data to display No results found.   1. Strain of  lumbar paraspinous muscle       MDM          Benny Lennert, MD 03/15/12 1256

## 2012-03-15 NOTE — ED Notes (Signed)
Onset 3 days ago getting out of bed bed on floor with no box spring and patient's twenty pound child on patient's back.  States lower back pain continued today.  Pain 7/10 achy dull sharp.  Moves all extremities bilateral equal and strong  Airway intact bilateral equal chest rise and fall.

## 2012-03-15 NOTE — ED Notes (Signed)
Pt went to stand up with child from bed and back popped and now with lower back pain that has been since Saturday.  Pt has some tingling, no incontinence

## 2012-03-15 NOTE — Discharge Instructions (Signed)
Follow up if not improving.  No heavy lifting

## 2012-04-26 ENCOUNTER — Other Ambulatory Visit: Payer: Self-pay | Admitting: Obstetrics

## 2012-05-28 ENCOUNTER — Other Ambulatory Visit: Payer: Self-pay | Admitting: Obstetrics

## 2012-08-17 LAB — OB RESULTS CONSOLE ABO/RH: RH Type: POSITIVE

## 2012-08-17 LAB — OB RESULTS CONSOLE HGB/HCT, BLOOD
HCT: 44 %
Hemoglobin: 14.7 g/dL

## 2012-08-17 LAB — OB RESULTS CONSOLE HIV ANTIBODY (ROUTINE TESTING): HIV: NONREACTIVE

## 2012-08-17 LAB — OB RESULTS CONSOLE HEPATITIS B SURFACE ANTIGEN: Hepatitis B Surface Ag: NEGATIVE

## 2012-08-17 LAB — OB RESULTS CONSOLE RPR: RPR: NONREACTIVE

## 2012-11-16 NOTE — L&D Delivery Note (Signed)
Delivery Note At 9:26 AM a viable female was delivered via Vaginal, Spontaneous Delivery (Presentation: ROA ).   Placenta status: Intact, Spontaneous.  Cord: 3 vessels with the following complications: none .    Anesthesia: Epidural  Episiotomy: None Lacerations: 2nd degree;Labial Suture Repair: 2.0 3.0 vicryl rapide Est. Blood Loss (mL): 300 ml  Mom to postpartum.  Baby to nursery-stable.  JACKSON-MOORE,Emilo Gras A 03/31/2013, 9:51 AM

## 2012-12-08 LAB — OB RESULTS CONSOLE HIV ANTIBODY (ROUTINE TESTING): HIV: NONREACTIVE

## 2012-12-08 LAB — OB RESULTS CONSOLE HGB/HCT, BLOOD: Hemoglobin: 12.8 g/dL

## 2012-12-08 LAB — OB RESULTS CONSOLE RPR: RPR: NONREACTIVE

## 2012-12-08 LAB — OB RESULTS CONSOLE PLATELET COUNT: Platelets: 257 10*3/uL

## 2013-01-17 ENCOUNTER — Encounter (HOSPITAL_COMMUNITY): Payer: Self-pay

## 2013-01-17 ENCOUNTER — Emergency Department (HOSPITAL_COMMUNITY)
Admission: EM | Admit: 2013-01-17 | Discharge: 2013-01-17 | Disposition: A | Discharge status: Home / Self Care | Payer: Medicaid Other | Attending: Emergency Medicine | Admitting: Emergency Medicine

## 2013-01-17 DIAGNOSIS — L299 Pruritus, unspecified: Secondary | ICD-10-CM | POA: Insufficient documentation

## 2013-01-17 DIAGNOSIS — Z79899 Other long term (current) drug therapy: Secondary | ICD-10-CM | POA: Insufficient documentation

## 2013-01-17 DIAGNOSIS — J45909 Unspecified asthma, uncomplicated: Secondary | ICD-10-CM | POA: Insufficient documentation

## 2013-01-17 DIAGNOSIS — O9989 Other specified diseases and conditions complicating pregnancy, childbirth and the puerperium: Secondary | ICD-10-CM | POA: Insufficient documentation

## 2013-01-17 DIAGNOSIS — B86 Scabies: Secondary | ICD-10-CM | POA: Insufficient documentation

## 2013-01-17 DIAGNOSIS — O99330 Smoking (tobacco) complicating pregnancy, unspecified trimester: Secondary | ICD-10-CM | POA: Insufficient documentation

## 2013-01-17 MED ORDER — PERMETHRIN 5 % EX CREA: TOPICAL_CREAM | CUTANEOUS | Status: DC

## 2013-01-17 NOTE — ED Provider Notes (Signed)
History  This chart was scribed for non-physician practitioner working with Lyanne Co, MD, by Candelaria Stagers, ED Scribe. This patient was seen in room TR11C/TR11C and the patient's care was started at 3:16 PM   CSN: [id number]  Arrival date & time 01/17/13  1449   First MD Initiated Contact with Patient 01/17/13 1510      Chief Complaint  Patient presents with  . Pruritis     The history is provided by the patient. No language interpreter was used.   Breanna [name] is a 27 y.o. female who presents to the Emergency Department complaining of itching raised bumps on entire body which she states started about 29 weeks ago worsening after moving into new trailer.  Pt is [redacted] weeks pregnant and states that she has been experiencing an itching rash her entire pregnancy.  She has seen her OB for the itching rash and has taken prednisone and hydrocortisone cream with no relief.  Pt denies difficulty breathing or swallowing.  Pt reports that her family moved into a new home two weeks ago.  Her son was diagnosed with scabies yesterday and has not yet been treated.  Pt reports her husband is also experiencing itching bumps on entire body.  This is the pt's second pregnancy and reports that she did not experience similar sx with last pregnancy.  Pt reports that she has applied benadryl cream with relief from itching.     Past Medical History  Diagnosis Date  . Asthma     Past Surgical History  Procedure Laterality Date  . No past surgeries    . Wisdom tooth extraction      No family history on file.  History  Substance Use Topics  . Smoking status: Current Every Day Smoker -- 0.20 packs/day  . Smokeless tobacco: Never Used  . Alcohol Use: No    OB History   Grav Para Term Preterm Abortions TAB SAB Ect Mult Living   1 1 1       1       Review of Systems  HENT: Negative for trouble swallowing.   Respiratory: Negative for shortness of breath.   Skin: Positive for rash.  All other  systems reviewed and are negative.    Allergies  Morphine and related; Avelox; and Percocet  Home Medications   Current Outpatient Rx  Name  Route  Sig  Dispense  Refill  . albuterol (PROVENTIL HFA;VENTOLIN HFA) 108 (90 BASE) MCG/ACT inhaler   Inhalation   Inhale 2 puffs into the lungs every 6 (six) hours as needed. Patient takes for shortness of breath          . ibuprofen (ADVIL,MOTRIN) 200 MG tablet   Oral   Take 400 mg by mouth every 6 (six) hours as needed. For pain         . naproxen (NAPROSYN) 500 MG tablet   Oral   Take 1 tablet (500 mg total) by mouth 2 (two) times daily.   30 tablet   0     There were no vitals taken for this visit.  Physical Exam  Nursing note and vitals reviewed. Constitutional: She is oriented to person, place, and time. She appears well-developed and well-nourished. No distress.  HENT:  Head: Normocephalic and atraumatic.  Mouth/Throat: Oropharynx is clear and moist.  Eyes: Conjunctivae and EOM are normal.  Neck: Normal range of motion. Neck supple. No tracheal deviation present.  Cardiovascular: Normal rate, regular rhythm and normal heart sounds.  Pulmonary/Chest: Effort normal and breath sounds normal. No respiratory distress.  Musculoskeletal: Normal range of motion.  Neurological: She is alert and oriented to person, place, and time.  Skin: Skin is warm and dry.  Scattered 1 mm erythematous raised lesions with burrowed marks and tracking on trunks, arms, and legs including hands and feet sparing the palms of hands or soles of feet.  Also, sparing the mucosal area.      Psychiatric: She has a normal mood and affect. Her behavior is normal.    ED Course  Procedures  DIAGNOSTIC STUDIES:  COORDINATION OF CARE:  3:22 PM Discussed course of care with pt at bedside which includes treatment for scabies with permethrin cream.  Advised pt to continue use of prednisone that was prescribed by OB and contact OB if she is concerned with  the recently medication prescribed.  Pt understands and agrees.   Labs Reviewed - No data to display No results found.   1. Scabies       MDM  Scabies-Rx permethrin cream. Advised patient to continue medications prescribed by her OB/GYN. She will followup with her OB/GYN. No airway compromise. Entire family with same symptoms. Patient is in no apparent distress. Scabies infection care and precautions discussed. Return precautions discussed. Patient states understanding of plan and is agreeable.  I personally performed the services described in this documentation, which was scribed in my presence. The recorded information has been reviewed and is accurate.    Trevor Mace, PA-C 01/17/13 1550

## 2013-01-17 NOTE — ED Provider Notes (Signed)
Medical screening examination/treatment/procedure(s) were conducted as a shared visit with non-physician practitioner(s) and myself.  I personally evaluated the patient during the enc  Will tx as scabies.   Lyanne Co, MD 01/17/13 [phone]

## 2013-01-17 NOTE — ED Notes (Signed)
Pt states she is [redacted] weeks pregnant and has been itching entire pregnancy.  Pt has seen MD but was thought to be pregnancy related.  Pt has reddened bumps on entire body.  Pt's son was dx with scabies yesterday but has not been treated.  Husband is also itching.

## 2013-01-24 LAB — US OB DETAIL + 14 WK

## 2013-01-28 ENCOUNTER — Emergency Department (HOSPITAL_COMMUNITY)
Admission: EM | Admit: 2013-01-28 | Discharge: 2013-01-28 | Disposition: A | Discharge status: Home / Self Care | Payer: Medicaid Other | Attending: Emergency Medicine | Admitting: Emergency Medicine

## 2013-01-28 ENCOUNTER — Encounter (HOSPITAL_COMMUNITY): Payer: Self-pay | Admitting: *Deleted

## 2013-01-28 DIAGNOSIS — J45909 Unspecified asthma, uncomplicated: Secondary | ICD-10-CM | POA: Insufficient documentation

## 2013-01-28 DIAGNOSIS — O98819 Other maternal infectious and parasitic diseases complicating pregnancy, unspecified trimester: Secondary | ICD-10-CM | POA: Insufficient documentation

## 2013-01-28 DIAGNOSIS — R21 Rash and other nonspecific skin eruption: Secondary | ICD-10-CM | POA: Insufficient documentation

## 2013-01-28 DIAGNOSIS — O99330 Smoking (tobacco) complicating pregnancy, unspecified trimester: Secondary | ICD-10-CM | POA: Insufficient documentation

## 2013-01-28 DIAGNOSIS — B86 Scabies: Secondary | ICD-10-CM

## 2013-01-28 DIAGNOSIS — O9989 Other specified diseases and conditions complicating pregnancy, childbirth and the puerperium: Secondary | ICD-10-CM | POA: Insufficient documentation

## 2013-01-28 MED ORDER — PERMETHRIN 1 % EX LOTN: TOPICAL_LOTION | Freq: Once | CUTANEOUS | Status: DC

## 2013-01-28 MED ORDER — PERMETHRIN 5 % EX CREA: TOPICAL_CREAM | CUTANEOUS | Status: DC

## 2013-01-28 NOTE — ED Provider Notes (Signed)
History  This chart was scribed for non-physician practitioner working with Dierdre Forth, PA-C Vida Roller, MD, by Candelaria Stagers, ED Scribe. This patient was seen in Triage and the patient's care was started at 5:09 PM   CSN: [id number]  Arrival date & time 01/28/13  1547   First MD Initiated Contact with Patient 01/28/13 1705      Chief Complaint  Patient presents with  . Rash     The history is provided by the patient. No language interpreter was used.   Breanna [name] is a 27 y.o. female who presents to the Emergency Department complaining of continued scabies rash after being diagnosed with scabies about one week ago.  Pt is [redacted] weeks pregnant and reports she has had the rash since she became pregnant.  She has noticed some improvement with the cream that was prescribed one week ago and has recently ran out of the cream.      Past Medical History  Diagnosis Date  . Asthma     Past Surgical History  Procedure Laterality Date  . No past surgeries    . Wisdom tooth extraction      History reviewed. No pertinent family history.  History  Substance Use Topics  . Smoking status: Current Every Day Smoker -- 0.20 packs/day  . Smokeless tobacco: Never Used  . Alcohol Use: No    OB History   Grav Para Term Preterm Abortions TAB SAB Ect Mult Living   1 1 1       1       Review of Systems  Constitutional: Negative for fever and chills.  Skin: Positive for rash.  All other systems reviewed and are negative.    Allergies  Morphine and related; Avelox; and Percocet  Home Medications   Current Outpatient Rx  Name  Route  Sig  Dispense  Refill  . Prenatal Vit-Fe Fumarate-FA (PRENATAL MULTIVITAMIN) TABS   Oral   Take 1 tablet by mouth daily at 12 noon.         . permethrin (ELIMITE) 5 % cream      Apply to affected area once   30 g   1     BP 131/67  Pulse 88  Temp(Src) 98.5 F (36.9 C) (Oral)  Resp 18  SpO2 95%  Physical Exam  Nursing  note and vitals reviewed. Constitutional: She is oriented to person, place, and time. She appears well-developed and well-nourished. No distress.  HENT:  Head: Normocephalic and atraumatic.  Eyes: EOM are normal.  Neck: Neck supple. No tracheal deviation present.  Cardiovascular: Normal rate and regular rhythm.   Pulmonary/Chest: Effort normal. No respiratory distress.  Genitourinary:  Gravid uterus.   Musculoskeletal: Normal range of motion.  Neurological: She is alert and oriented to person, place, and time.  Skin: Skin is warm and dry. Rash noted.  Erythematous papules and burrows without vessicles noted on arms, chest, legs, back, and buttocks with significant excoriations  Psychiatric: She has a normal mood and affect. Her behavior is normal.    ED Course  Procedures  DIAGNOSTIC STUDIES: Oxygen Saturation is 95% on room air, normal by my interpretation.    COORDINATION OF CARE:  5:13 PM Discussed course of care with pt which includes refill of promethazine cream.  Pt understands and agrees.    Labs Reviewed - No data to display No results found.   1. Scabies       MDM  Discussed diagnosis & treatment of  scabies with parents.  They have been advised to followup with her primary care doctor 2 weeks after treatment.  They have also been advised to clean entire household including washing sheets and using R.I.D. spray in the car and on sofa.   The use of permethrin cream was discussed as well, they were told to use cream from head to toe & leave on child for 8-12 hours.  They've been advised to repeat treatment if new eruptions occur. Patient's parents verbalized understanding.   I personally performed the services described in this documentation, which was scribed in my presence. The recorded information has been reviewed and is accurate.   Dahlia Client Arek Spadafore, PA-C 01/28/13 2358

## 2013-01-28 NOTE — ED Notes (Signed)
Pt already diagnosed with scabies, needs a refill for cream.

## 2013-01-29 ENCOUNTER — Encounter: Payer: Self-pay | Admitting: *Deleted

## 2013-01-30 NOTE — ED Provider Notes (Signed)
Medical screening examination/treatment/procedure(s) were performed by non-physician practitioner and as supervising physician I was immediately available for consultation/collaboration.    Betty Brooks D Lasean Gorniak, MD 01/30/13 1504 

## 2013-02-01 ENCOUNTER — Encounter: Payer: Self-pay | Admitting: Obstetrics & Gynecology

## 2013-02-03 ENCOUNTER — Encounter: Payer: Self-pay | Admitting: Obstetrics

## 2013-02-08 ENCOUNTER — Ambulatory Visit (INDEPENDENT_AMBULATORY_CARE_PROVIDER_SITE_OTHER): Payer: Medicaid Other | Admitting: Obstetrics & Gynecology

## 2013-02-08 VITALS — BP 125/84 | Temp 98.6°F | Wt 286.0 lb

## 2013-02-08 DIAGNOSIS — Z3480 Encounter for supervision of other normal pregnancy, unspecified trimester: Secondary | ICD-10-CM

## 2013-02-08 DIAGNOSIS — O3660X0 Maternal care for excessive fetal growth, unspecified trimester, not applicable or unspecified: Secondary | ICD-10-CM

## 2013-02-08 LAB — POCT URINALYSIS DIPSTICK: Urobilinogen, UA: NEGATIVE

## 2013-02-08 NOTE — Progress Notes (Signed)
Pulse-98.6 Pt wants to discuss results of last ultrasound.

## 2013-02-08 NOTE — Progress Notes (Signed)
No complaints. Doing well.

## 2013-02-16 ENCOUNTER — Ambulatory Visit (INDEPENDENT_AMBULATORY_CARE_PROVIDER_SITE_OTHER): Payer: Medicaid Other | Admitting: Obstetrics & Gynecology

## 2013-02-16 ENCOUNTER — Inpatient Hospital Stay (HOSPITAL_COMMUNITY)
Admission: AD | Admit: 2013-02-16 | Discharge: 2013-02-16 | Disposition: A | Discharge status: Home / Self Care | Payer: Medicaid Other | Source: Ambulatory Visit | Attending: Obstetrics & Gynecology | Admitting: Obstetrics & Gynecology

## 2013-02-16 ENCOUNTER — Encounter: Payer: Self-pay | Admitting: Obstetrics & Gynecology

## 2013-02-16 ENCOUNTER — Encounter: Payer: Medicaid Other | Admitting: Obstetrics & Gynecology

## 2013-02-16 ENCOUNTER — Encounter (HOSPITAL_COMMUNITY): Payer: Self-pay

## 2013-02-16 VITALS — BP 114/75 | Temp 97.9°F | Wt 287.2 lb

## 2013-02-16 DIAGNOSIS — Z3480 Encounter for supervision of other normal pregnancy, unspecified trimester: Secondary | ICD-10-CM

## 2013-02-16 DIAGNOSIS — O4690 Antepartum hemorrhage, unspecified, unspecified trimester: Secondary | ICD-10-CM

## 2013-02-16 DIAGNOSIS — O4693 Antepartum hemorrhage, unspecified, third trimester: Secondary | ICD-10-CM

## 2013-02-16 HISTORY — DX: Depression, unspecified: F32.A

## 2013-02-16 HISTORY — DX: Polycystic ovarian syndrome: E28.2

## 2013-02-16 HISTORY — DX: Major depressive disorder, single episode, unspecified: F32.9

## 2013-02-16 LAB — WET PREP, GENITAL
Trich, Wet Prep: NONE SEEN
Yeast Wet Prep HPF POC: NONE SEEN

## 2013-02-16 LAB — URINALYSIS, ROUTINE W REFLEX MICROSCOPIC
Bilirubin Urine: NEGATIVE
Nitrite: NEGATIVE
Specific Gravity, Urine: 1.01 (ref 1.005–1.030)
Urobilinogen, UA: 0.2 mg/dL (ref 0.0–1.0)
pH: 6 (ref 5.0–8.0)

## 2013-02-16 LAB — POCT URINALYSIS DIPSTICK
Bilirubin, UA: NEGATIVE
Ketones, UA: NEGATIVE
Spec Grav, UA: 1.015
pH, UA: 5

## 2013-02-16 LAB — CBC
Platelets: 268 10*3/uL (ref 150–400)
RBC: 4.09 MIL/uL (ref 3.87–5.11)
RDW: 13.4 % (ref 11.5–15.5)
WBC: 9.4 10*3/uL (ref 4.0–10.5)

## 2013-02-16 LAB — URINE MICROSCOPIC-ADD ON

## 2013-02-16 LAB — GC/CHLAMYDIA PROBE AMP: GC Probe RNA: NEGATIVE

## 2013-02-16 NOTE — MAU Provider Note (Signed)
History     CSN: [id number]  Arrival date and time: 02/16/13 1324   First Provider Initiated Contact with Patient 02/16/13 0047      Chief Complaint  Patient presents with  . Vaginal Bleeding   HPI  Breanna [name] is a 27 y.o. G2P1001 at [redacted]w[redacted]d who presents today via EMS. She states that at 2345 she went to the bathroom and had some blood tinged mucous when she wiped. Her last intercourse was 2 weeks ago and she deneis any recent abdominal trauma. She denies any pain or LOF, and confirms fetal movement at the time of presentation. Ultrasound report from 01/24/13 shows no evidence of placenta previa. She states that since the initial episode of spotting she had not had anymore bleeding or spotting.   Past Medical History  Diagnosis Date  . Asthma   . Depression   . Polycystic ovarian syndrome     Past Surgical History  Procedure Laterality Date  . No past surgeries    . Wisdom tooth extraction      History reviewed. No pertinent family history.  History  Substance Use Topics  . Smoking status: Current Some Day Smoker -- 0.00 packs/day  . Smokeless tobacco: Never Used  . Alcohol Use: No    Allergies:  Allergies  Allergen Reactions  . Morphine And Related Nausea And Vomiting  . Avelox (Moxifloxacin Hcl In Nacl) Rash  . Percocet (Oxycodone-Acetaminophen) Hives and Rash    Prescriptions prior to admission  Medication Sig Dispense Refill  . Prenatal Vit-Fe Fumarate-FA (PRENATAL MULTIVITAMIN) TABS Take 1 tablet by mouth daily at 12 noon.      . permethrin (ELIMITE) 5 % cream Apply to affected area once  30 g  1    Review of Systems  Constitutional: Negative for fever.  Eyes: Negative for blurred vision.  Respiratory: Negative for shortness of breath.   Gastrointestinal: Negative for nausea, vomiting, abdominal pain, diarrhea and constipation.  Genitourinary: Negative for dysuria, urgency and frequency.  Neurological: Negative for dizziness and headaches.    Physical Exam   Blood pressure 117/71, pulse 89, temperature 97.8 F (36.6 C), temperature source Oral, resp. rate 18, last menstrual period 06/05/2012, SpO2 98.00%.  Physical Exam  Nursing note and vitals reviewed. Constitutional: She is oriented to person, place, and time. She appears well-developed and well-nourished. No distress.  Cardiovascular: Normal rate.   Respiratory: Effort normal.  GI: Soft. She exhibits no distension. There is no tenderness.  Genitourinary:   External: no lesion Vagina: no blood seen, scant amount of white discharge Cervix: closed/thick/posterior/-3 Uterus: AGA  Neurological: She is alert and oriented to person, place, and time.  Skin: Skin is warm.  Psychiatric: She has a normal mood and affect.   FHT: Cat I Toco: irregular UC's, patient states that she is not feeling them  MAU Course  Procedures  Results for orders placed during the hospital encounter of 02/16/13 (from the past 24 hour(s))  URINALYSIS, ROUTINE W REFLEX MICROSCOPIC     Status: Abnormal   Collection Time    02/16/13 12:15 AM      Result Value Range   Color, Urine YELLOW  YELLOW   APPearance HAZY (*) CLEAR   Specific Gravity, Urine 1.010  1.005 - 1.030   pH 6.0  5.0 - 8.0   Glucose, UA NEGATIVE  NEGATIVE mg/dL   Hgb urine dipstick LARGE (*) NEGATIVE   Bilirubin Urine NEGATIVE  NEGATIVE   Ketones, ur NEGATIVE  NEGATIVE mg/dL  Protein, ur 30 (*) NEGATIVE mg/dL   Urobilinogen, UA 0.2  0.0 - 1.0 mg/dL   Nitrite NEGATIVE  NEGATIVE   Leukocytes, UA TRACE (*) NEGATIVE  URINE MICROSCOPIC-ADD ON     Status: Abnormal   Collection Time    02/16/13 12:15 AM      Result Value Range   Squamous Epithelial / LPF FEW (*) RARE   WBC, UA 3-6  <3 WBC/hpf   RBC / HPF TOO NUMEROUS TO COUNT  <3 RBC/hpf   Bacteria, UA FEW (*) RARE  CBC     Status: Abnormal   Collection Time    02/16/13 12:20 AM      Result Value Range   WBC 9.4  4.0 - 10.5 K/uL   RBC 4.09  3.87 - 5.11 MIL/uL    Hemoglobin 12.0  12.0 - 15.0 g/dL   HCT 11.9 (*) 14.7 - 82.9 %   MCV 86.6  78.0 - 100.0 fL   MCH 29.3  26.0 - 34.0 pg   MCHC 33.9  30.0 - 36.0 g/dL   RDW 56.2  13.0 - 86.5 %   Platelets 268  150 - 400 K/uL  WET PREP, GENITAL     Status: Abnormal   Collection Time    02/16/13 12:50 AM      Result Value Range   Yeast Wet Prep HPF POC NONE SEEN  NONE SEEN   Trich, Wet Prep NONE SEEN  NONE SEEN   Clue Cells Wet Prep HPF POC NONE SEEN  NONE SEEN   WBC, Wet Prep HPF POC MANY (*) NONE SEEN   0106: Spoke with Dr. Tamela Oddi reviewed current status and physical exam OK for DC home fu in the office this week.  Assessment and Plan   1. Vaginal bleeding in pregnancy, third trimester    FU with Dr. Tamela Oddi this week 3rd trimester danger signs reviewed  Breanna [name] 02/16/2013, 12:55 AM

## 2013-02-16 NOTE — MAU Note (Signed)
PT states that she went to the bathroom and when she looked in the toilet she saw bloody mucus. Denies any leaking of fluid

## 2013-02-16 NOTE — Progress Notes (Signed)
Pulse-82 Pt reports being seen at [facility] last PM for mucous discharge with bloody show. Pt reports was on NST and did show contractions every 4 minutes. Pt was released home. Pt denies any spotting today.

## 2013-02-16 NOTE — Patient Instructions (Addendum)
Vaginal Bleeding During Pregnancy, Third Trimester A small amount of bleeding (spotting) is relatively common in pregnancy. Sometimes bleeding may be "normal." Bleeding in the third trimester can be very serious for the mother and the baby, and should be reported to your caregiver right away. It is very important to follow your caregiver's instructions. CAUSES Possible causes of bleeding during the third trimester:  The placenta may be partially covering or completely covering the opening to the cervix (placenta previa).  The placenta may have separated from the uterus (abruption of the placenta).  There may be an infection or growth on the cervix.  You may be starting labor, called discharging of the mucus plug.  The placenta may grow into the muscle layer of the uterus (placenta accreta). DIAGNOSIS  Your caregiver may do:  Pelvic exam in the delivery room, called a double set up (being ready to do an emergency cesarean delivery, if necessary).  Blood tests, to see if you are anemic (not having enough red blood cells).  Ultrasound and fetal monitoring, to see if the baby is having problems.  Ultrasound, to find out why you are bleeding. TREATMENT   You may need to remain in the hospital.  You may be given an IV (intravenous) while you are in the hospital.  You may be put on strict bed rest.  You may need a blood transfusion.  You may be placed on oxygen.  The baby may need to be delivered immediately. HOME CARE INSTRUCTIONS   Your caregiver may order bed rest (getting up to go to the bathroom only). At this time, you may need to make arrangements for the care of children and for other responsibilities.  Keep track of the number of pads you use each day and how soaked (saturated) they are. Write this down.  Do not use tampons. Do not douche.  Do not have sexual intercourse or any sexual activity that may cause an orgasm, until approved by your caregiver.  Follow your  caregiver's advice about lifting, driving, physical and social activities.  Eat a balanced and nutritious diet.  Get plenty of rest and sleep.  Do not drink alcohol or smoke. SEEK IMMEDIATE MEDICAL CARE IF:   You experience severe cramps or pain in your back or belly (abdomen).  You have an oral temperature above 102 F (38.9 C), not controlled by medicine.  You develop chills.  You have a gush of fluid from the vagina.  You pass large clots or tissue. Save any tissue for your caregiver to inspect.  Your bleeding increases or you become light-headed or weak.  You pass out.  You feel less movement or no movement of the baby. Document Released: 01/23/2003 Document Revised: 01/25/2012 Document Reviewed: 09/30/2009 Exit[employer] Patient Information 2013 [employer].  

## 2013-02-16 NOTE — Progress Notes (Signed)
Continued tobacco use.  Counseled re: smoking cessation.  No bleeding at present.  NST reactive with uterine irritability.

## 2013-02-17 ENCOUNTER — Other Ambulatory Visit: Payer: Self-pay | Admitting: Obstetrics & Gynecology

## 2013-02-17 ENCOUNTER — Encounter: Payer: Self-pay | Admitting: Obstetrics & Gynecology

## 2013-02-17 ENCOUNTER — Ambulatory Visit (HOSPITAL_COMMUNITY)
Admission: RE | Admit: 2013-02-17 | Discharge: 2013-02-17 | Disposition: A | Discharge status: Home / Self Care | Payer: Medicaid Other | Source: Ambulatory Visit | Attending: Obstetrics & Gynecology | Admitting: Obstetrics & Gynecology

## 2013-02-17 DIAGNOSIS — Z3480 Encounter for supervision of other normal pregnancy, unspecified trimester: Secondary | ICD-10-CM

## 2013-02-17 DIAGNOSIS — O3660X0 Maternal care for excessive fetal growth, unspecified trimester, not applicable or unspecified: Secondary | ICD-10-CM | POA: Insufficient documentation

## 2013-02-17 DIAGNOSIS — O409XX0 Polyhydramnios, unspecified trimester, not applicable or unspecified: Secondary | ICD-10-CM | POA: Insufficient documentation

## 2013-02-17 LAB — US OB DETAIL + 14 WK

## 2013-02-17 LAB — URINE CULTURE

## 2013-02-20 ENCOUNTER — Encounter: Payer: Self-pay | Admitting: Obstetrics & Gynecology

## 2013-02-20 DIAGNOSIS — IMO0002 Reserved for concepts with insufficient information to code with codable children: Secondary | ICD-10-CM | POA: Insufficient documentation

## 2013-02-20 DIAGNOSIS — O409XX0 Polyhydramnios, unspecified trimester, not applicable or unspecified: Secondary | ICD-10-CM | POA: Insufficient documentation

## 2013-02-21 ENCOUNTER — Encounter: Payer: Self-pay | Admitting: Obstetrics & Gynecology

## 2013-02-22 ENCOUNTER — Ambulatory Visit (INDEPENDENT_AMBULATORY_CARE_PROVIDER_SITE_OTHER): Payer: Medicaid Other | Admitting: Obstetrics & Gynecology

## 2013-02-22 VITALS — BP 119/84 | Temp 97.4°F | Wt 285.0 lb

## 2013-02-22 DIAGNOSIS — E669 Obesity, unspecified: Secondary | ICD-10-CM

## 2013-02-22 DIAGNOSIS — Z3480 Encounter for supervision of other normal pregnancy, unspecified trimester: Secondary | ICD-10-CM

## 2013-02-22 LAB — POCT URINALYSIS DIPSTICK
Ketones, UA: NEGATIVE
Spec Grav, UA: 1.02
pH, UA: 5

## 2013-02-22 NOTE — Progress Notes (Signed)
NST OK; irregular UCs.  Cervix closed.

## 2013-02-22 NOTE — Progress Notes (Signed)
Pulse: 103

## 2013-03-01 ENCOUNTER — Encounter: Payer: Self-pay | Admitting: Obstetrics

## 2013-03-01 ENCOUNTER — Ambulatory Visit (INDEPENDENT_AMBULATORY_CARE_PROVIDER_SITE_OTHER): Payer: Medicaid Other | Admitting: Obstetrics

## 2013-03-01 VITALS — BP 120/81 | Temp 98.2°F | Wt 287.6 lb

## 2013-03-01 DIAGNOSIS — E669 Obesity, unspecified: Secondary | ICD-10-CM

## 2013-03-01 DIAGNOSIS — Z3480 Encounter for supervision of other normal pregnancy, unspecified trimester: Secondary | ICD-10-CM

## 2013-03-01 NOTE — Progress Notes (Signed)
Pulse: 89

## 2013-03-08 ENCOUNTER — Ambulatory Visit (INDEPENDENT_AMBULATORY_CARE_PROVIDER_SITE_OTHER): Payer: Medicaid Other | Admitting: Obstetrics & Gynecology

## 2013-03-08 ENCOUNTER — Encounter: Payer: Self-pay | Admitting: Obstetrics & Gynecology

## 2013-03-08 VITALS — BP 120/79 | Temp 97.5°F | Wt 288.8 lb

## 2013-03-08 DIAGNOSIS — Z3480 Encounter for supervision of other normal pregnancy, unspecified trimester: Secondary | ICD-10-CM

## 2013-03-08 DIAGNOSIS — Z3483 Encounter for supervision of other normal pregnancy, third trimester: Secondary | ICD-10-CM

## 2013-03-08 LAB — POCT URINALYSIS DIPSTICK
Blood, UA: NEGATIVE
Nitrite, UA: NEGATIVE
Urobilinogen, UA: NEGATIVE
pH, UA: 5

## 2013-03-08 LAB — OB RESULTS CONSOLE GBS: GBS: NEGATIVE

## 2013-03-08 NOTE — Progress Notes (Signed)
NST reactive.

## 2013-03-08 NOTE — Patient Instructions (Signed)

## 2013-03-08 NOTE — Progress Notes (Signed)
Pulse- 80 

## 2013-03-14 ENCOUNTER — Encounter: Payer: Self-pay | Admitting: Obstetrics

## 2013-03-14 ENCOUNTER — Ambulatory Visit (INDEPENDENT_AMBULATORY_CARE_PROVIDER_SITE_OTHER): Payer: Medicaid Other | Admitting: Obstetrics

## 2013-03-14 VITALS — BP 117/78 | Temp 98.1°F | Wt 292.0 lb

## 2013-03-14 DIAGNOSIS — Z3483 Encounter for supervision of other normal pregnancy, third trimester: Secondary | ICD-10-CM

## 2013-03-14 DIAGNOSIS — O4690 Antepartum hemorrhage, unspecified, unspecified trimester: Secondary | ICD-10-CM

## 2013-03-14 DIAGNOSIS — Z3480 Encounter for supervision of other normal pregnancy, unspecified trimester: Secondary | ICD-10-CM

## 2013-03-14 DIAGNOSIS — O409XX0 Polyhydramnios, unspecified trimester, not applicable or unspecified: Secondary | ICD-10-CM

## 2013-03-14 NOTE — Progress Notes (Signed)
Pulse- 80 

## 2013-03-16 ENCOUNTER — Encounter: Payer: Medicaid Other | Admitting: Obstetrics & Gynecology

## 2013-03-23 ENCOUNTER — Ambulatory Visit (INDEPENDENT_AMBULATORY_CARE_PROVIDER_SITE_OTHER): Payer: Medicaid Other | Admitting: Obstetrics & Gynecology

## 2013-03-23 ENCOUNTER — Encounter: Payer: Self-pay | Admitting: Obstetrics & Gynecology

## 2013-03-23 ENCOUNTER — Other Ambulatory Visit: Payer: Medicaid Other

## 2013-03-23 VITALS — BP 121/80 | Temp 98.4°F | Wt 293.0 lb

## 2013-03-23 DIAGNOSIS — Z3483 Encounter for supervision of other normal pregnancy, third trimester: Secondary | ICD-10-CM

## 2013-03-23 DIAGNOSIS — Z3480 Encounter for supervision of other normal pregnancy, unspecified trimester: Secondary | ICD-10-CM

## 2013-03-23 LAB — POCT URINALYSIS DIPSTICK
Blood, UA: NEGATIVE
Ketones, UA: NEGATIVE
Spec Grav, UA: 1.025
pH, UA: 5

## 2013-03-23 NOTE — Progress Notes (Signed)
P 76 Patient reports she is doing well today- patient states she is having some pelvic discomfort.

## 2013-03-23 NOTE — Progress Notes (Signed)
Doing well 

## 2013-03-23 NOTE — Patient Instructions (Signed)
Labor Induction  Most women go into labor on their own between 37 and 42 weeks of the pregnancy. When this does not happen or when there is a medical need, medicine or other methods may be used to induce labor. Labor induction causes a pregnant woman's uterus to contract. It also causes the cervix to soften (ripen), open (dilate), and thin out (efface). Usually, labor is not induced before 39 weeks of the pregnancy unless there is a problem with the baby or mother. Whether your labor will be induced depends on a number of factors, including the following:  The medical condition of you and the baby.  How many weeks along you are.  The status of baby's lung maturity.  The condition of the cervix.  The position of the baby. REASONS FOR LABOR INDUCTION  The health of the baby or mother is at risk.  The pregnancy is overdue by 1 week or more.  The water breaks but labor does not start on its own.  The mother has a health condition or serious illness such as high blood pressure, infection, placental abruption, or diabetes.  The amniotic fluid amounts are low around the baby.  The baby is distressed. REASONS TO NOT INDUCE LABOR Labor induction may not be a good idea if:  It is shown that your baby does not tolerate labor.  An induction is just more convenient.  You want the baby to be born on a certain date, like a holiday.  You have had previous surgeries on your uterus, such as a myomectomy or the removal of fibroids.  Your placenta lies very low in the uterus and blocks the opening of the cervix (placenta previa).  Your baby is not in a head down position.  The umbilical cord drops down into the birth canal in front of the baby. This could cut off the baby's blood and oxygen supply.  You have had a previous cesarean delivery.  There areunusual circumstances, such as the baby being extremely premature. RISKS AND COMPLICATIONS Problems may occur in the process of induction  and plans may need to be modified as a situation unfolds. Some of the risks of induction include:  Change in fetal heart rate, such as too high, too low, or erratic.  Risk of fetal distress.  Risk of infection to mother and baby.  Increased chance of having a cesarean delivery.  The rare, but increased chance that the placenta will separate from the uterus (abruption).  Uterine rupture (very rare). When induction is needed for medical reasons, the benefits of induction may outweigh the risks. BEFORE THE PROCEDURE Your caregiver will check your cervix and the baby's position. This will help your caregiver decide if you are far enough along for an induction to work. PROCEDURE Several methods of labor induction may be used, such as:   Taking prostaglandin medicine to dilate and ripen the cervix. The medicine will also start contractions. It can be taken by mouth or by inserting a suppository into the vagina.  A thin tube (catheter) with a balloon on the end may be inserted into your vagina to dilate the cervix. Once inserted, the balloon expands with water, which causes the cervix to open.  Striping the membranes. Your caregiver inserts a finger between the cervix and membranes, which causes the cervix to be stretched and may cause the uterus to contract. This is often done during an office visit. You will be sent home to wait for the contractions to begin. You will   then come in for an induction.  Breaking the water. Your caregiver will make a hole in the amniotic sac using a small instrument. Once the amniotic sac breaks, contractions should begin. This may still take hours to see an effect.  Taking medicine to trigger or strengthen contractions. This medicine is given intravenously through a tube in your arm. All of the methods of induction, besides stripping the membranes, will be done in the hospital. Induction is done in the hospital so that you and the baby can be carefully  monitored. AFTER THE PROCEDURE Some inductions can take up to 2 or 3 days. Depending on the cervix, it usually takes less time. It takes longer when you are induced early in the pregnancy or if this is your first pregnancy. If a mother is still pregnant and the induction has been going on for 2 to 3 days, either the mother will be sent home or a cesarean delivery will be needed. Document Released: 03/24/2007 Document Revised: 01/25/2012 Document Reviewed: 09/07/2011 Exit[employer] Patient Information 2013 [employer].  

## 2013-03-24 ENCOUNTER — Other Ambulatory Visit: Payer: Self-pay | Admitting: *Deleted

## 2013-03-24 ENCOUNTER — Encounter (HOSPITAL_COMMUNITY): Payer: Self-pay | Admitting: *Deleted

## 2013-03-24 ENCOUNTER — Inpatient Hospital Stay (HOSPITAL_COMMUNITY)
Admission: AD | Admit: 2013-03-24 | Discharge: 2013-03-24 | Disposition: A | Discharge status: Home / Self Care | Payer: Medicaid Other | Source: Ambulatory Visit | Attending: Obstetrics | Admitting: Obstetrics

## 2013-03-24 ENCOUNTER — Telehealth (HOSPITAL_COMMUNITY): Payer: Self-pay | Admitting: *Deleted

## 2013-03-24 DIAGNOSIS — O4693 Antepartum hemorrhage, unspecified, third trimester: Secondary | ICD-10-CM

## 2013-03-24 DIAGNOSIS — IMO0002 Reserved for concepts with insufficient information to code with codable children: Secondary | ICD-10-CM

## 2013-03-24 DIAGNOSIS — O4700 False labor before 37 completed weeks of gestation, unspecified trimester: Secondary | ICD-10-CM | POA: Insufficient documentation

## 2013-03-24 MED ORDER — TERBUTALINE SULFATE 1 MG/ML IJ SOLN: 0.2500 mg | Freq: Once | INTRAMUSCULAR | Status: AC | PRN

## 2013-03-24 MED ORDER — LACTATED RINGERS IV SOLN: 500.0000 mL | INTRAVENOUS | Status: DC | PRN

## 2013-03-24 MED ORDER — LIDOCAINE HCL (PF) 1 % IJ SOLN: 30.0000 mL | INTRAMUSCULAR | Status: DC | PRN

## 2013-03-24 MED ORDER — HYDROXYZINE HCL 10 MG PO TABS: 50.0000 mg | ORAL_TABLET | Freq: Four times a day (QID) | ORAL | Status: DC | PRN

## 2013-03-24 MED ORDER — ONDANSETRON HCL 4 MG/2ML IJ SOLN: 4.0000 mg | Freq: Four times a day (QID) | INTRAMUSCULAR | Status: DC | PRN

## 2013-03-24 MED ORDER — IBUPROFEN 200 MG PO TABS: 600.0000 mg | ORAL_TABLET | Freq: Four times a day (QID) | ORAL | Status: DC | PRN

## 2013-03-24 MED ORDER — MISOPROSTOL 25 MCG QUARTER TABLET: 25.0000 ug | ORAL_TABLET | ORAL | Status: DC | PRN

## 2013-03-24 MED ORDER — OXYTOCIN BOLUS FROM INFUSION: 500.0000 mL | INTRAVENOUS | Status: DC

## 2013-03-24 MED ORDER — CITRIC ACID-SODIUM CITRATE 334-500 MG/5ML PO SOLN: 30.0000 mL | ORAL | Status: DC | PRN

## 2013-03-24 MED ORDER — LACTATED RINGERS IV SOLN: INTRAVENOUS | Status: DC

## 2013-03-24 MED ORDER — OXYTOCIN 40 UNITS IN LACTATED RINGERS INFUSION - SIMPLE MED: 62.5000 mL/h | INTRAVENOUS | Status: DC

## 2013-03-24 NOTE — Telephone Encounter (Signed)
Preadmission screen  

## 2013-03-24 NOTE — MAU Note (Signed)
Here due to pressure that started today around 1pm. Pt. Thought she was having contractions after she felt the pressure but they were irregular. Denies bleeding or discharge or any leakage of fluid. Baby is moving well. Denies any other problems or concerns. Did have check up with Dr. Tamela Oddi yesterday and at that time she said she was "not closed" but she said the Dr. Did not give her a number of dilation at that time.

## 2013-03-24 NOTE — MAU Note (Signed)
Been having a lot of pelvic pressure, constant. Is real uncomfortable.  Some contractions, but few and far between.

## 2013-03-27 ENCOUNTER — Ambulatory Visit (INDEPENDENT_AMBULATORY_CARE_PROVIDER_SITE_OTHER): Payer: Medicaid Other | Admitting: Obstetrics & Gynecology

## 2013-03-27 ENCOUNTER — Encounter: Payer: Self-pay | Admitting: Obstetrics & Gynecology

## 2013-03-27 VITALS — BP 129/78 | Temp 97.9°F | Wt 295.0 lb

## 2013-03-27 DIAGNOSIS — Z3480 Encounter for supervision of other normal pregnancy, unspecified trimester: Secondary | ICD-10-CM

## 2013-03-27 DIAGNOSIS — Z3483 Encounter for supervision of other normal pregnancy, third trimester: Secondary | ICD-10-CM

## 2013-03-27 LAB — POCT URINALYSIS DIPSTICK
Blood, UA: NEGATIVE
Glucose, UA: NEGATIVE
Nitrite, UA: NEGATIVE
Spec Grav, UA: 1.02
Urobilinogen, UA: NEGATIVE
pH, UA: 5

## 2013-03-27 NOTE — Progress Notes (Signed)
Doing well 

## 2013-03-27 NOTE — Progress Notes (Signed)
P 86   pt reports she is doing well today

## 2013-03-27 NOTE — Patient Instructions (Addendum)
Fetal Movement Counts Patient Name: __________________________________________________ Patient Due Date: ____________________ Kick counts is highly recommended in high risk pregnancies, but it is a good idea for every pregnant woman to do. Start counting fetal movements at 28 weeks of the pregnancy. Fetal movements increase after eating a full meal or eating or drinking something sweet (the blood sugar is higher). It is also important to drink plenty of fluids (well hydrated) before doing the count. Lie on your left side because it helps with the circulation or you can sit in a comfortable chair with your arms over your belly (abdomen) with no distractions around you. DOING THE COUNT  Try to do the count the same time of day each time you do it.  Mark the day and time, then see how long it takes for you to feel 10 movements (kicks, flutters, swishes, rolls). You should have at least 10 movements within 2 hours. You will most likely feel 10 movements in much less than 2 hours. If you do not, wait an hour and count again. After a couple of days you will see a pattern.  What you are looking for is a change in the pattern or not enough counts in 2 hours. Is it taking longer in time to reach 10 movements? SEEK MEDICAL CARE IF:  You feel less than 10 counts in 2 hours. Tried twice.  No movement in one hour.  The pattern is changing or taking longer each day to reach 10 counts in 2 hours.  You feel the baby is not moving as it usually does. Date: ____________ Movements: ____________ Start time: ____________ Finish time: ____________  Date: ____________ Movements: ____________ Start time: ____________ Finish time: ____________ Date: ____________ Movements: ____________ Start time: ____________ Finish time: ____________ Date: ____________ Movements: ____________ Start time: ____________ Finish time: ____________ Date: ____________ Movements: ____________ Start time: ____________ Finish time:  ____________ Date: ____________ Movements: ____________ Start time: ____________ Finish time: ____________ Date: ____________ Movements: ____________ Start time: ____________ Finish time: ____________ Date: ____________ Movements: ____________ Start time: ____________ Finish time: ____________  Date: ____________ Movements: ____________ Start time: ____________ Finish time: ____________ Date: ____________ Movements: ____________ Start time: ____________ Finish time: ____________ Date: ____________ Movements: ____________ Start time: ____________ Finish time: ____________ Date: ____________ Movements: ____________ Start time: ____________ Finish time: ____________ Date: ____________ Movements: ____________ Start time: ____________ Finish time: ____________ Date: ____________ Movements: ____________ Start time: ____________ Finish time: ____________ Date: ____________ Movements: ____________ Start time: ____________ Finish time: ____________  Date: ____________ Movements: ____________ Start time: ____________ Finish time: ____________ Date: ____________ Movements: ____________ Start time: ____________ Finish time: ____________ Date: ____________ Movements: ____________ Start time: ____________ Finish time: ____________ Date: ____________ Movements: ____________ Start time: ____________ Finish time: ____________ Date: ____________ Movements: ____________ Start time: ____________ Finish time: ____________ Date: ____________ Movements: ____________ Start time: ____________ Finish time: ____________ Date: ____________ Movements: ____________ Start time: ____________ Finish time: ____________  Date: ____________ Movements: ____________ Start time: ____________ Finish time: ____________ Date: ____________ Movements: ____________ Start time: ____________ Finish time: ____________ Date: ____________ Movements: ____________ Start time: ____________ Finish time: ____________ Date: ____________ Movements:  ____________ Start time: ____________ Finish time: ____________ Date: ____________ Movements: ____________ Start time: ____________ Finish time: ____________ Date: ____________ Movements: ____________ Start time: ____________ Finish time: ____________ Date: ____________ Movements: ____________ Start time: ____________ Finish time: ____________  Date: ____________ Movements: ____________ Start time: ____________ Finish time: ____________ Date: ____________ Movements: ____________ Start time: ____________ Finish time: ____________ Date: ____________ Movements: ____________ Start time: ____________ Finish time: ____________ Date: ____________ Movements:   ____________ Start time: ____________ Finish time: ____________ Date: ____________ Movements: ____________ Start time: ____________ Finish time: ____________ Date: ____________ Movements: ____________ Start time: ____________ Finish time: ____________ Date: ____________ Movements: ____________ Start time: ____________ Finish time: ____________  Date: ____________ Movements: ____________ Start time: ____________ Finish time: ____________ Date: ____________ Movements: ____________ Start time: ____________ Finish time: ____________ Date: ____________ Movements: ____________ Start time: ____________ Finish time: ____________ Date: ____________ Movements: ____________ Start time: ____________ Finish time: ____________ Date: ____________ Movements: ____________ Start time: ____________ Finish time: ____________ Date: ____________ Movements: ____________ Start time: ____________ Finish time: ____________ Date: ____________ Movements: ____________ Start time: ____________ Finish time: ____________  Date: ____________ Movements: ____________ Start time: ____________ Finish time: ____________ Date: ____________ Movements: ____________ Start time: ____________ Finish time: ____________ Date: ____________ Movements: ____________ Start time: ____________ Finish  time: ____________ Date: ____________ Movements: ____________ Start time: ____________ Finish time: ____________ Date: ____________ Movements: ____________ Start time: ____________ Finish time: ____________ Date: ____________ Movements: ____________ Start time: ____________ Finish time: ____________ Date: ____________ Movements: ____________ Start time: ____________ Finish time: ____________  Date: ____________ Movements: ____________ Start time: ____________ Finish time: ____________ Date: ____________ Movements: ____________ Start time: ____________ Finish time: ____________ Date: ____________ Movements: ____________ Start time: ____________ Finish time: ____________ Date: ____________ Movements: ____________ Start time: ____________ Finish time: ____________ Date: ____________ Movements: ____________ Start time: ____________ Finish time: ____________ Date: ____________ Movements: ____________ Start time: ____________ Finish time: ____________ Document Released: 12/02/2006 Document Revised: 01/25/2012 Document Reviewed: 06/04/2009 [employer] Patient Information 2013 [employer]  

## 2013-03-30 ENCOUNTER — Inpatient Hospital Stay (HOSPITAL_COMMUNITY)
Admission: RE | Admit: 2013-03-30 | Discharge: 2013-04-02 | DRG: 775 | Disposition: A | Discharge status: Home / Self Care | Payer: Medicaid Other | Source: Ambulatory Visit | Attending: Obstetrics & Gynecology | Admitting: Obstetrics & Gynecology

## 2013-03-30 ENCOUNTER — Encounter (HOSPITAL_COMMUNITY): Payer: Self-pay

## 2013-03-30 ENCOUNTER — Encounter: Payer: Medicaid Other | Admitting: Obstetrics & Gynecology

## 2013-03-30 VITALS — BP 119/74 | HR 85 | Temp 98.2°F | Resp 16 | Ht 68.0 in | Wt 295.0 lb

## 2013-03-30 DIAGNOSIS — O4693 Antepartum hemorrhage, unspecified, third trimester: Secondary | ICD-10-CM

## 2013-03-30 DIAGNOSIS — IMO0002 Reserved for concepts with insufficient information to code with codable children: Secondary | ICD-10-CM

## 2013-03-30 DIAGNOSIS — O409XX0 Polyhydramnios, unspecified trimester, not applicable or unspecified: Principal | ICD-10-CM | POA: Diagnosis present

## 2013-03-30 DIAGNOSIS — O403XX1 Polyhydramnios, third trimester, fetus 1: Secondary | ICD-10-CM

## 2013-03-30 LAB — CBC
Hemoglobin: 12.1 g/dL (ref 12.0–15.0)
Platelets: 283 10*3/uL (ref 150–400)
RBC: 4.3 MIL/uL (ref 3.87–5.11)
WBC: 9.2 10*3/uL (ref 4.0–10.5)

## 2013-03-30 LAB — TYPE AND SCREEN
ABO/RH(D): O POS
Antibody Screen: NEGATIVE

## 2013-03-30 MED ORDER — LIDOCAINE HCL (PF) 1 % IJ SOLN
30.0000 mL | INTRAMUSCULAR | Status: AC | PRN
  Administered 2013-03-31: 30 mL via SUBCUTANEOUS
  Filled 2013-03-30 (×2): qty 30

## 2013-03-30 MED ORDER — FLEET ENEMA 7-19 GM/118ML RE ENEM: 1.0000 | ENEMA | RECTAL | Status: DC | PRN

## 2013-03-30 MED ORDER — IBUPROFEN 600 MG PO TABS
600.0000 mg | ORAL_TABLET | Freq: Four times a day (QID) | ORAL | Status: DC | PRN
  Administered 2013-03-31: 600 mg via ORAL
  Filled 2013-03-30: qty 1

## 2013-03-30 MED ORDER — OXYCODONE-ACETAMINOPHEN 5-325 MG PO TABS: 1.0000 | ORAL_TABLET | ORAL | Status: DC | PRN

## 2013-03-30 MED ORDER — EPHEDRINE 5 MG/ML INJ
10.0000 mg | INTRAVENOUS | Status: DC | PRN
  Filled 2013-03-30: qty 2

## 2013-03-30 MED ORDER — MISOPROSTOL 25 MCG QUARTER TABLET
25.0000 ug | ORAL_TABLET | ORAL | Status: AC
  Administered 2013-03-30 – 2013-03-31 (×2): 25 ug via VAGINAL
  Filled 2013-03-30 (×2): qty 0.25

## 2013-03-30 MED ORDER — ZOLPIDEM TARTRATE 5 MG PO TABS
5.0000 mg | ORAL_TABLET | Freq: Every evening | ORAL | Status: DC | PRN
  Administered 2013-03-31: 5 mg via ORAL
  Filled 2013-03-30: qty 1

## 2013-03-30 MED ORDER — OXYTOCIN BOLUS FROM INFUSION: 500.0000 mL | INTRAVENOUS | Status: DC

## 2013-03-30 MED ORDER — LACTATED RINGERS IV SOLN
INTRAVENOUS | Status: DC
  Administered 2013-03-30: 21:00:00 via INTRAVENOUS

## 2013-03-30 MED ORDER — EPHEDRINE 5 MG/ML INJ
10.0000 mg | INTRAVENOUS | Status: DC | PRN
  Filled 2013-03-30: qty 4
  Filled 2013-03-30: qty 2

## 2013-03-30 MED ORDER — CITRIC ACID-SODIUM CITRATE 334-500 MG/5ML PO SOLN: 30.0000 mL | ORAL | Status: DC | PRN

## 2013-03-30 MED ORDER — ACETAMINOPHEN 325 MG PO TABS: 650.0000 mg | ORAL_TABLET | ORAL | Status: DC | PRN

## 2013-03-30 MED ORDER — PHENYLEPHRINE 40 MCG/ML (10ML) SYRINGE FOR IV PUSH (FOR BLOOD PRESSURE SUPPORT)
80.0000 ug | PREFILLED_SYRINGE | INTRAVENOUS | Status: DC | PRN
  Filled 2013-03-30: qty 2
  Filled 2013-03-30: qty 5

## 2013-03-30 MED ORDER — ONDANSETRON HCL 4 MG/2ML IJ SOLN
4.0000 mg | Freq: Four times a day (QID) | INTRAMUSCULAR | Status: DC | PRN
  Administered 2013-03-31: 4 mg via INTRAVENOUS
  Filled 2013-03-30: qty 2

## 2013-03-30 MED ORDER — OXYTOCIN 40 UNITS IN LACTATED RINGERS INFUSION - SIMPLE MED
62.5000 mL/h | INTRAVENOUS | Status: DC
  Administered 2013-03-31: 999 mL/h via INTRAVENOUS
  Filled 2013-03-30: qty 1000

## 2013-03-30 MED ORDER — FENTANYL 2.5 MCG/ML BUPIVACAINE 1/10 % EPIDURAL INFUSION (WH - ANES)
14.0000 mL/h | INTRAMUSCULAR | Status: DC | PRN
  Filled 2013-03-30: qty 125

## 2013-03-30 MED ORDER — LACTATED RINGERS IV SOLN
500.0000 mL | INTRAVENOUS | Status: DC | PRN
  Administered 2013-03-31: 500 mL via INTRAVENOUS

## 2013-03-30 MED ORDER — PHENYLEPHRINE 40 MCG/ML (10ML) SYRINGE FOR IV PUSH (FOR BLOOD PRESSURE SUPPORT)
80.0000 ug | PREFILLED_SYRINGE | INTRAVENOUS | Status: DC | PRN
  Filled 2013-03-30: qty 2

## 2013-03-30 MED ORDER — LACTATED RINGERS IV SOLN: 500.0000 mL | Freq: Once | INTRAVENOUS | Status: DC

## 2013-03-30 MED ORDER — DIPHENHYDRAMINE HCL 50 MG/ML IJ SOLN: 12.5000 mg | INTRAMUSCULAR | Status: DC | PRN

## 2013-03-30 MED ORDER — TERBUTALINE SULFATE 1 MG/ML IJ SOLN: 0.2500 mg | Freq: Once | INTRAMUSCULAR | Status: AC | PRN

## 2013-03-31 ENCOUNTER — Encounter (HOSPITAL_COMMUNITY): Payer: Self-pay

## 2013-03-31 ENCOUNTER — Inpatient Hospital Stay (HOSPITAL_COMMUNITY): Payer: Medicaid Other | Admitting: Anesthesiology

## 2013-03-31 ENCOUNTER — Encounter (HOSPITAL_COMMUNITY): Payer: Self-pay | Admitting: Anesthesiology

## 2013-03-31 LAB — SYPHILIS: RPR W/REFLEX TO RPR TITER AND TREPONEMAL ANTIBODIES, TRADITIONAL SCREENING AND DIAGNOSIS ALGORITHM: RPR Ser Ql: NONREACTIVE

## 2013-03-31 MED ORDER — WITCH HAZEL-GLYCERIN EX PADS: 1.0000 "application " | MEDICATED_PAD | CUTANEOUS | Status: DC | PRN

## 2013-03-31 MED ORDER — TETANUS-DIPHTH-ACELL PERTUSSIS 5-2.5-18.5 LF-MCG/0.5 IM SUSP
0.5000 mL | Freq: Once | INTRAMUSCULAR | Status: AC
  Administered 2013-04-01: 0.5 mL via INTRAMUSCULAR
  Filled 2013-03-31: qty 0.5

## 2013-03-31 MED ORDER — DIPHENHYDRAMINE HCL 25 MG PO CAPS: 25.0000 mg | ORAL_CAPSULE | Freq: Four times a day (QID) | ORAL | Status: DC | PRN

## 2013-03-31 MED ORDER — ONDANSETRON HCL 4 MG/2ML IJ SOLN: 4.0000 mg | INTRAMUSCULAR | Status: DC | PRN

## 2013-03-31 MED ORDER — BENZOCAINE-MENTHOL 20-0.5 % EX AERO: 1.0000 "application " | INHALATION_SPRAY | CUTANEOUS | Status: DC | PRN

## 2013-03-31 MED ORDER — FERROUS SULFATE 325 (65 FE) MG PO TABS
325.0000 mg | ORAL_TABLET | Freq: Two times a day (BID) | ORAL | Status: DC
  Administered 2013-03-31 – 2013-04-02 (×4): 325 mg via ORAL
  Filled 2013-03-31 (×4): qty 1

## 2013-03-31 MED ORDER — ONDANSETRON HCL 4 MG PO TABS: 4.0000 mg | ORAL_TABLET | ORAL | Status: DC | PRN

## 2013-03-31 MED ORDER — IBUPROFEN 600 MG PO TABS
600.0000 mg | ORAL_TABLET | Freq: Four times a day (QID) | ORAL | Status: DC
  Administered 2013-03-31 – 2013-04-02 (×8): 600 mg via ORAL
  Filled 2013-03-31 (×8): qty 1

## 2013-03-31 MED ORDER — MEASLES, MUMPS & RUBELLA VAC ~~LOC~~ INJ: 0.5000 mL | INJECTION | Freq: Once | SUBCUTANEOUS | Status: DC

## 2013-03-31 MED ORDER — FENTANYL 2.5 MCG/ML BUPIVACAINE 1/10 % EPIDURAL INFUSION (WH - ANES)
INTRAMUSCULAR | Status: DC | PRN
  Administered 2013-03-31: 14 mL/h via EPIDURAL

## 2013-03-31 MED ORDER — MAGNESIUM HYDROXIDE 400 MG/5ML PO SUSP: 30.0000 mL | ORAL | Status: DC | PRN

## 2013-03-31 MED ORDER — ZOLPIDEM TARTRATE 5 MG PO TABS
5.0000 mg | ORAL_TABLET | Freq: Every evening | ORAL | Status: DC | PRN
Start: 2013-03-31 — End: 2013-04-02

## 2013-03-31 MED ORDER — BUTORPHANOL TARTRATE 1 MG/ML IJ SOLN
2.0000 mg | INTRAMUSCULAR | Status: DC | PRN
  Administered 2013-03-31 (×2): 2 mg via INTRAVENOUS
  Filled 2013-03-31 (×2): qty 2

## 2013-03-31 MED ORDER — DIBUCAINE 1 % RE OINT: 1.0000 "application " | TOPICAL_OINTMENT | RECTAL | Status: DC | PRN

## 2013-03-31 MED ORDER — LIDOCAINE HCL (PF) 1 % IJ SOLN
INTRAMUSCULAR | Status: DC | PRN
  Administered 2013-03-31 (×2): 4 mL

## 2013-03-31 MED ORDER — HYDROMORPHONE HCL 2 MG PO TABS
2.0000 mg | ORAL_TABLET | Freq: Four times a day (QID) | ORAL | Status: DC | PRN
  Administered 2013-03-31 – 2013-04-02 (×4): 2 mg via ORAL
  Filled 2013-03-31 (×4): qty 1

## 2013-03-31 MED ORDER — SENNOSIDES-DOCUSATE SODIUM 8.6-50 MG PO TABS
2.0000 | ORAL_TABLET | Freq: Every day | ORAL | Status: DC
  Administered 2013-03-31 – 2013-04-01 (×2): 2 via ORAL

## 2013-03-31 MED ORDER — MEDROXYPROGESTERONE ACETATE 150 MG/ML IM SUSP
150.0000 mg | INTRAMUSCULAR | Status: DC | PRN
Start: 2013-03-31 — End: 2013-04-02

## 2013-03-31 MED ORDER — LANOLIN HYDROUS EX OINT: TOPICAL_OINTMENT | CUTANEOUS | Status: DC | PRN

## 2013-03-31 MED ORDER — PRENATAL MULTIVITAMIN CH
1.0000 | ORAL_TABLET | Freq: Every day | ORAL | Status: DC
  Filled 2013-03-31 (×2): qty 1

## 2013-03-31 NOTE — Anesthesia Procedure Notes (Signed)
Epidural Patient location during procedure: OB Start time: 03/31/2013 8:42 AM  Staffing Anesthesiologist: Ranen Doolin A. Performed by: anesthesiologist   Preanesthetic Checklist Completed: patient identified, site marked, surgical consent, pre-op evaluation, timeout performed, IV checked, risks and benefits discussed and monitors and equipment checked  Epidural Patient position: sitting Prep: site prepped and draped and DuraPrep Patient monitoring: continuous pulse ox and blood pressure Approach: midline Injection technique: LOR air  Needle:  Needle type: Tuohy  Needle gauge: 17 G Needle length: 9 cm and 9 Needle insertion depth: 7 cm Catheter type: closed end flexible Catheter size: 19 Gauge Catheter at skin depth: 12 cm Test dose: negative and Other  Assessment Events: blood not aspirated, injection not painful, no injection resistance, negative IV test and no paresthesia  Additional Notes Patient identified. Risks and benefits discussed including failed block, incomplete  Pain control, post dural puncture headache, nerve damage, paralysis, blood pressure Changes, nausea, vomiting, reactions to medications-both toxic and allergic and post Partum back pain. All questions were answered. Patient expressed understanding and wished to proceed. Sterile technique was used throughout procedure. Epidural site was Dressed with sterile barrier dressing. No paresthesias, signs of intravascular injection Or signs of intrathecal spread were encountered.  Patient was more comfortable after the epidural was dosed. Please see RN's note for documentation of vital signs and FHR which are stable.

## 2013-03-31 NOTE — Anesthesia Preprocedure Evaluation (Signed)
Anesthesia Evaluation  Patient identified by MRN, date of birth, ID band Patient awake    Reviewed: Allergy & Precautions, H&P , Patient's Chart, lab work & pertinent test results  Airway Mallampati: III TM Distance: >3 FB Neck ROM: full    Dental no notable dental hx. (+) Teeth Intact   Pulmonary asthma , Current Smoker,  breath sounds clear to auscultation  Pulmonary exam normal       Cardiovascular negative cardio ROS  Rhythm:regular Rate:Normal     Neuro/Psych PSYCHIATRIC DISORDERS Depression negative neurological ROS     GI/Hepatic Neg liver ROS, GERD-  Medicated and Controlled,  Endo/Other  negative endocrine ROSMorbid obesityPCOS  Renal/GU negative Renal ROS  negative genitourinary   Musculoskeletal negative musculoskeletal ROS (+)   Abdominal Normal abdominal exam  (+)   Peds  Hematology negative hematology ROS (+)   Anesthesia Other Findings   Reproductive/Obstetrics (+) Pregnancy                           Anesthesia Physical Anesthesia Plan  ASA: III  Anesthesia Plan: Epidural   Post-op Pain Management:    Induction:   Airway Management Planned:   Additional Equipment:   Intra-op Plan:   Post-operative Plan:   Informed Consent: I have reviewed the patients History and Physical, chart, labs and discussed the procedure including the risks, benefits and alternatives for the proposed anesthesia with the patient or authorized representative who has indicated his/her understanding and acceptance.     Plan Discussed with: Anesthesiologist  Anesthesia Plan Comments:         Anesthesia Quick Evaluation

## 2013-03-31 NOTE — Progress Notes (Signed)
Breanna [name] is a 28 y.o. G2P1001 at [redacted]w[redacted]d by LMP admitted for elective  Subjective: Uncomfortable  Objective: BP 116/66  Pulse 76  Temp(Src) 98.1 F (36.7 C) (Oral)  Resp 18  Ht 5\' 8"  (1.727 m)  Wt 295 lb (133.811 kg)  BMI 44.86 kg/m2  SpO2 99%  LMP 06/05/2012      FHT:  FHR: 140 bpm, variability: moderate,  accelerations:  Present,  decelerations:  Absent UC:   regular, every 2 minutes SVE:   Dilation: 7 Effacement (%): 90 Station: 0 Exam by:: Belenda Cruise RN   Labs: Lab Results  Component Value Date   WBC 9.2 03/30/2013   HGB 12.1 03/30/2013   HCT 36.2 03/30/2013   MCV 84.2 03/30/2013   PLT 283 03/30/2013    Assessment / Plan: Induction of labor due to elective,  progressing well on pitocin  Labor: Progressing normally Preeclampsia:  elevated B/Ps likely pain related Fetal Wellbeing:  Category I Pain Control:  Epidural I/D:  n/a Anticipated MOD:  NSVD  JACKSON-MOORE,Julien Berryman A 03/31/2013, 9:15 AM

## 2013-03-31 NOTE — H&P (Signed)
Breanna [name] is a 27 y.o. female presenting for induction of labor. Maternal Medical History:  Reason for admission: She presents for an elective induction of labor.  Fetal activity: Perceived fetal activity is normal.    Prenatal complications: Polyhydramnios.   Prenatal Complications - Diabetes: none.    OB History   Grav Para Term Preterm Abortions TAB SAB Ect Mult Living   2 1 1       1      Past Medical History  Diagnosis Date  . Asthma   . Polycystic ovarian syndrome   . Depression     not current was 10 years ago   Past Surgical History  Procedure Laterality Date  . No past surgeries    . Wisdom tooth extraction     Family History: family history includes Alcohol abuse in her father; Bipolar disorder in her father; Cancer in her maternal grandmother; Dementia in her maternal grandfather and paternal uncle; Diabetes in her maternal grandfather; Heart disease in her brother and maternal grandmother; and Hyperlipidemia in her maternal grandfather. Social History:  reports that she has been smoking.  She has never used smokeless tobacco. She reports that she does not drink alcohol or use illicit drugs.     Review of Systems  Constitutional: Negative for fever.  Eyes: Negative for blurred vision.  Respiratory: Negative for shortness of breath.   Gastrointestinal: Negative for vomiting.  Skin: Negative for rash.  Neurological: Negative for headaches.    Dilation: 4.5 Effacement (%): 90 Station: -1 Exam by:: Belenda Cruise RN  Blood pressure 114/77, pulse 71, temperature 98.6 F (37 C), temperature source Oral, resp. rate 18, height 5\' 8"  (1.727 m), weight 295 lb (133.811 kg), last menstrual period 06/05/2012. Maternal Exam:  Abdomen: Patient reports no abdominal tenderness. Fetal presentation: vertex  Introitus: not evaluated.   Pelvis: adequate for delivery.   Cervix: Cervix evaluated by digital exam.     Fetal Exam Fetal Monitor Review: Variability:  moderate (6-25 bpm).   Pattern: accelerations present and no decelerations.    Fetal State Assessment: Category I - tracings are normal.     Physical Exam  Constitutional: She appears well-developed.  HENT:  Head: Normocephalic.  Neck: Neck supple. No thyromegaly present.  Cardiovascular: Normal rate and regular rhythm.   Respiratory: Breath sounds normal.  GI: Soft. Bowel sounds are normal.  Skin: No rash noted.    Prenatal labs: ABO, Rh: --/--/O POS (05/15 2050) Antibody: NEG (05/15 2050) Rubella: Immune (10/02 0000) RPR: NON REACTIVE (05/15 2050)  HBsAg: Negative (10/02 0000)  HIV: Non-reactive (01/23 0000)  GBS: Negative (04/23 0000)   Assessment/Plan: Primipara at [redacted]w[redacted]d for an elective induction of labor.  Pregnancy complicated by mild, idiopathic polyhydramnios, accelerated fetal growth  Admit Two-stage induction of labor   JACKSON-MOORE,Nakea Gouger A 03/31/2013, 8:17 AM

## 2013-03-31 NOTE — Progress Notes (Signed)
Patient c/o abdominal cramping and perineum pain uncontrolled by motrin. Has allergy to percocet and morphine. Patient states ultram and vicodin have not worked in the past. States demerol and tylenol #3 have worked in past. Called Dr Tamela Oddi and order received. Will continue to monitor patient.

## 2013-03-31 NOTE — Anesthesia Postprocedure Evaluation (Signed)
  Anesthesia Post-op Note  Patient: Breanna [name]  Procedure(s) Performed: * No procedures listed *  Patient Location: PACU and Mother/Baby  Anesthesia Type:Epidural  Level of Consciousness: awake, alert  and oriented  Airway and Oxygen Therapy: Patient Spontanous Breathing  Post-op Pain: none  Post-op Assessment: Post-op Vital signs reviewed, Patient's Cardiovascular Status Stable, No headache, No backache, No residual numbness and No residual motor weakness  Post-op Vital Signs: Reviewed and stable  Complications: No apparent anesthesia complications

## 2013-04-01 NOTE — Progress Notes (Signed)
Patient ID: Breanna [name], female   DOB: Sep 26, 1986, 27 y.o.   MRN: [id number] Post Partum Day 1 S/P induced vaginal RH status/Rubella reviewed.  Feeding: breast Subjective: No HA, SOB, CP, F/C, breast symptoms. Normal vaginal bleeding, no clots.     Objective: BP 110/70  Pulse 78  Temp(Src) 97.2 F (36.2 C) (Oral)  Resp 20  Ht 5\' 8"  (1.727 m)  Wt 295 lb (133.811 kg)  BMI 44.86 kg/m2  SpO2 96%  LMP 06/05/2012   Physical Exam:  General: alert Lochia: inappropriate Uterine Fundus: firm DVT Evaluation: No evidence of DVT seen on physical exam. Ext: No c/c/e  Recent Labs  03/30/13 2050  HGB 12.1  HCT 36.2      Assessment/Plan: 27 y.o.  PPD #1 .  normal postpartum exam Continue current postpartum care Ambulate   LOS: 2 days   JACKSON-MOORE,Breanna [name] 04/01/2013, 11:03 AM

## 2013-04-02 MED ORDER — HYDROMORPHONE HCL 2 MG PO TABS: 2.0000 mg | ORAL_TABLET | Freq: Four times a day (QID) | ORAL | Status: DC | PRN

## 2013-04-02 NOTE — Discharge Summary (Signed)
  Obstetric Discharge Summary Reason for Admission: induction of labor Prenatal Procedures: none Intrapartum Procedures: spontaneous vaginal delivery Postpartum Procedures: none Complications-Operative and Postpartum: none  Hemoglobin  Date Value Range Status  03/30/2013 12.1  12.0 - 15.0 g/dL Final  1/61/0960 45.4   Final     HCT  Date Value Range Status  03/30/2013 36.2  36.0 - 46.0 % Final  12/08/2012 37   Final    Physical Exam:  General: alert Lochia: appropriate Uterine: firm Incision: n/a DVT Evaluation: No evidence of DVT seen on physical exam.  Discharge Diagnoses: Active Problems:   Normal delivery   Discharge Information: Date: 04/02/2013 Activity: pelvic rest Diet: routine Medications:  Prior to Admission medications   Medication Sig Start Date End Date Taking? Authorizing Provider  acetaminophen (TYLENOL) 500 MG tablet Take 500-1,000 mg by mouth every 6 (six) hours as needed for pain (For headache.).   Yes Historical Provider, MD  albuterol (PROVENTIL HFA;VENTOLIN HFA) 108 (90 BASE) MCG/ACT inhaler Inhale 2 puffs into the lungs every 6 (six) hours as needed for wheezing or shortness of breath.   Yes Historical Provider, MD  diphenhydrAMINE (BENADRYL) 2 % cream Apply 1 application topically 3 (three) times daily as needed for itching.   Yes Historical Provider, MD  diphenhydrAMINE (BENADRYL) 25 MG tablet Take 25 mg by mouth daily as needed for allergies.   Yes Historical Provider, MD  Prenatal Vit-Fe Fumarate-FA (PRENATAL MULTIVITAMIN) TABS Take 1 tablet by mouth daily at 12 noon.   Yes Historical Provider, MD  HYDROmorphone (DILAUDID) 2 MG tablet Take 1-2 tablets (2-4 mg total) by mouth every 6 (six) hours as needed. 04/02/13   Antionette Char, MD    Condition: stable Instructions: refer to routine discharge instructions Discharge to: home Follow-up Information   Follow up with Antionette Char A, MD. Schedule an appointment as soon as possible for a  visit in 2 weeks.   Contact information:   [address] [place] [address] [phone]       Newborn Data: Live born  Information for the patient's newborn:  Ilyanna, Baillargeon [[id number]]  female ; APGAR (1 MIN): 9   APGAR (5 MINS): 9    Home with mother.  JACKSON-MOORE,Lamark Schue A 04/02/2013, 11:38 AM

## 2013-04-03 NOTE — Progress Notes (Signed)
Post discharge chart review completed.  

## 2013-04-08 NOTE — Addendum Note (Signed)
Addendum created 04/08/13 1746 by Sandrea Hughs., MD   Modules edited: Anesthesia Events

## 2013-04-19 ENCOUNTER — Ambulatory Visit (INDEPENDENT_AMBULATORY_CARE_PROVIDER_SITE_OTHER): Payer: Medicaid Other | Admitting: Obstetrics & Gynecology

## 2013-04-19 ENCOUNTER — Encounter: Payer: Self-pay | Admitting: Obstetrics & Gynecology

## 2013-04-19 NOTE — Patient Instructions (Addendum)

## 2013-04-19 NOTE — Progress Notes (Signed)
Subjective:     Breanna [name] is a 27 y.o. female who presents for a postpartum visit. She is 3 weeks postpartum following a spontaneous vaginal delivery. I have fully reviewed the prenatal and intrapartum course. The delivery was at 40 gestational weeks. Outcome: spontaneous vaginal delivery. Anesthesia: epidural. Postpartum course has been normal. Baby's course has been normal. Baby is feeding by breast. Bleeding staining only. Bowel function is normal. Bladder function is normal. Patient is not sexually active. Contraception method is abstinence. Postpartum depression screening: negative. Patient is interested in the Nexplanon.  The following portions of the patient's history were reviewed and updated as appropriate: allergies, current medications, past family history, past medical history, past social history, past surgical history and problem list.  Review of Systems Pertinent items are noted in HPI.   Objective:    BP 118/78  Pulse 73  Temp(Src) 99.4 F (37.4 C)  Ht 5\' 9"  (1.753 m)  Wt 274 lb (124.286 kg)  BMI 40.44 kg/m2  Breastfeeding? Yes        Assessment:    Postpartum.   Plan:     Contraception: plans Nexplanon  Follow up in: 3 weeks.

## 2013-05-10 ENCOUNTER — Encounter: Payer: Self-pay | Admitting: Obstetrics & Gynecology

## 2013-05-10 ENCOUNTER — Ambulatory Visit (INDEPENDENT_AMBULATORY_CARE_PROVIDER_SITE_OTHER): Payer: Medicaid Other | Admitting: Obstetrics & Gynecology

## 2013-05-10 DIAGNOSIS — Z30017 Encounter for initial prescription of implantable subdermal contraceptive: Secondary | ICD-10-CM

## 2013-05-10 DIAGNOSIS — Z3202 Encounter for pregnancy test, result negative: Secondary | ICD-10-CM

## 2013-05-10 DIAGNOSIS — E282 Polycystic ovarian syndrome: Secondary | ICD-10-CM | POA: Insufficient documentation

## 2013-05-10 LAB — CHOLESTEROL, TOTAL: Cholesterol: 240 mg/dL — ABNORMAL HIGH (ref 0–200)

## 2013-05-10 MED ORDER — ETONOGESTREL 68 MG ~~LOC~~ IMPL
68.0000 mg | DRUG_IMPLANT | Freq: Once | SUBCUTANEOUS | Status: AC
  Administered 2013-05-10: 68 mg via SUBCUTANEOUS

## 2013-05-10 NOTE — Progress Notes (Signed)
.   Subjective:     Breanna [name] is a 27 y.o. female who presents for a postpartum visit. She is 6 weeks postpartum following a spontaneous vaginal delivery. I have fully reviewed the prenatal and intrapartum course. The delivery was at 40 gestational weeks. Outcome: spontaneous vaginal delivery. Anesthesia: epidural. Postpartum course has been normal. Baby's course has been normal. Baby is feeding by breast. Bleeding no bleeding. Bowel function is normal. Bladder function is normal. Patient is not sexually active. Contraception method is none. Postpartum depression screening: negative.  The following portions of the patient's history were reviewed and updated as appropriate: allergies, current medications, past family history, past medical history, past social history, past surgical history and problem list.  Review of Systems Pertinent items are noted in HPI.   Objective:   General:  alert     Abdomen: soft, non-tender; bowel sounds normal; no masses,  no organomegaly   Vulva:  normal  Vagina: normal vagina  Cervix:  no lesions  Corpus: normal size, contour, position, consistency, mobility, non-tender  Adnexa:  normal adnexa          Assessment:     Normal postpartum exam.  Plan:     Contraception: Nexplanon  3. Follow up in: 6 weeks or as needed.      Marland Kitchen NEXPLANON INSERTION NOTE      Pregnancy test result:  Lab Results  Component Value Date   PREGTESTUR  Value: POSITIVE        THE SENSITIVITY OF THIS METHODOLOGY IS >24 mIU/mL 01/06/2011    Indications:  The patient desires contraception.  She understands risks, benefits, and alternatives to Implanon and would like to proceed.  Anesthesia:   Lidocaine 1% plain.  Procedure:  A time-out was performed confirming the procedure and the patient's allergy status.  The patient's non-dominant was identified as the left arm.  The protection cap was removed. While placing countertraction on the skin, the needle was inserted  at a 30 degree angle.  The applicator was held horizontal to the skin; the skin was tented upward as the needle was introduced into the subdermal space.  While holding the applicator in place, the slider was unlocked. The Nexplanon was removed from the field.  The Nexplanon was palpated to ensure proper placement.  Complications: None  Instructions:  The patient was instructed to remove the dressing in 24 hours and that some bruising is to be expected.  She was advised to use over the counter analgesics as needed for any pain at the site.  She is to keep the area dry for 24 hours and to call if her hand or arm becomes cold, numb, or blue.

## 2013-05-10 NOTE — Patient Instructions (Addendum)

## 2013-05-11 LAB — VITAMIN D 25 HYDROXY (VIT D DEFICIENCY, FRACTURES): Vit D, 25-Hydroxy: 35 ng/mL (ref 30–89)

## 2013-06-08 ENCOUNTER — Other Ambulatory Visit: Payer: Self-pay | Admitting: *Deleted

## 2013-06-08 MED ORDER — ALBUTEROL SULFATE HFA 108 (90 BASE) MCG/ACT IN AERS: 2.0000 | INHALATION_SPRAY | Freq: Four times a day (QID) | RESPIRATORY_TRACT | Status: DC | PRN

## 2013-06-09 ENCOUNTER — Encounter: Payer: Self-pay | Admitting: Obstetrics & Gynecology

## 2013-06-21 ENCOUNTER — Ambulatory Visit: Payer: Medicaid Other | Admitting: Obstetrics & Gynecology

## 2013-08-15 ENCOUNTER — Encounter: Payer: Self-pay | Admitting: Obstetrics & Gynecology

## 2013-11-26 ENCOUNTER — Emergency Department (HOSPITAL_COMMUNITY)
Admission: EM | Admit: 2013-11-26 | Discharge: 2013-11-26 | Disposition: A | Discharge status: Home / Self Care | Payer: Medicaid Other | Attending: Emergency Medicine | Admitting: Emergency Medicine

## 2013-11-26 ENCOUNTER — Encounter (HOSPITAL_COMMUNITY): Payer: Self-pay | Admitting: Emergency Medicine

## 2013-11-26 DIAGNOSIS — R52 Pain, unspecified: Secondary | ICD-10-CM | POA: Insufficient documentation

## 2013-11-26 DIAGNOSIS — IMO0001 Reserved for inherently not codable concepts without codable children: Secondary | ICD-10-CM | POA: Insufficient documentation

## 2013-11-26 DIAGNOSIS — R059 Cough, unspecified: Secondary | ICD-10-CM

## 2013-11-26 DIAGNOSIS — Z8742 Personal history of other diseases of the female genital tract: Secondary | ICD-10-CM | POA: Insufficient documentation

## 2013-11-26 DIAGNOSIS — Z8659 Personal history of other mental and behavioral disorders: Secondary | ICD-10-CM | POA: Insufficient documentation

## 2013-11-26 DIAGNOSIS — R6889 Other general symptoms and signs: Secondary | ICD-10-CM

## 2013-11-26 DIAGNOSIS — R05 Cough: Secondary | ICD-10-CM

## 2013-11-26 DIAGNOSIS — F17200 Nicotine dependence, unspecified, uncomplicated: Secondary | ICD-10-CM | POA: Insufficient documentation

## 2013-11-26 DIAGNOSIS — J111 Influenza due to unidentified influenza virus with other respiratory manifestations: Secondary | ICD-10-CM | POA: Insufficient documentation

## 2013-11-26 DIAGNOSIS — J45909 Unspecified asthma, uncomplicated: Secondary | ICD-10-CM | POA: Insufficient documentation

## 2013-11-26 NOTE — Discharge Instructions (Signed)
If you were given medicines take as directed.  If you are on coumadin or contraceptives realize their levels and effectiveness is altered by many different medicines.  If you have any reaction (rash, tongues swelling, other) to the medicines stop taking and see a physician.   °Please follow up as directed and return to the ER or see a physician for new or worsening symptoms.  Thank you. ° ° °

## 2013-11-26 NOTE — ED Provider Notes (Signed)
CSN: [id number]     Arrival date & time 11/26/13  [id number] History   First MD Initiated Contact with Patient 11/26/13 [phone]     Chief Complaint  Patient presents with  . Generalized Body Aches  . Cough   (Consider location/radiation/quality/duration/timing/severity/associated sxs/prior Treatment) HPI Comments: 28 yo female with asthma hx presents with body aches, cough, subjective fever for two days, family member with similar.  No immunosuppression hx.  Tolerating po. No travel.  Patient is a 28 y.o. female presenting with cough. The history is provided by the patient.  Cough Associated symptoms: myalgias   Associated symptoms: no chest pain, no chills, no fever, no headaches, no rash and no shortness of breath     Past Medical History  Diagnosis Date  . Asthma   . Polycystic ovarian syndrome   . Depression     not current was 10 years ago   Past Surgical History  Procedure Laterality Date  . No past surgeries    . Wisdom tooth extraction     Family History  Problem Relation Age of Onset  . Bipolar disorder Father   . Alcohol abuse Father   . Heart disease Brother   . Dementia Paternal Uncle   . Heart disease Maternal Grandmother     a fib  . Cancer Maternal Grandmother     breast  . Dementia Maternal Grandfather   . Hyperlipidemia Maternal Grandfather   . Diabetes Maternal Grandfather    History  Substance Use Topics  . Smoking status: Current Some Day Smoker -- 0.50 packs/day    Types: Cigarettes  . Smokeless tobacco: Never Used  . Alcohol Use: No   OB History   Grav Para Term Preterm Abortions TAB SAB Ect Mult Living   2 2 2       2      Review of Systems  Constitutional: Negative for fever and chills.  HENT: Positive for congestion.   Eyes: Negative for visual disturbance.  Respiratory: Positive for cough. Negative for shortness of breath.   Cardiovascular: Negative for chest pain.  Gastrointestinal: Negative for vomiting and abdominal pain.   Musculoskeletal: Positive for myalgias. Negative for back pain, neck pain and neck stiffness.  Skin: Negative for rash.  Neurological: Negative for light-headedness and headaches.    Allergies  Iodine; Morphine and related; Avelox; and Percocet  Home Medications   Current Outpatient Rx  Name  Route  Sig  Dispense  Refill  . acetaminophen (TYLENOL) 500 MG tablet   Oral   Take 500-1,000 mg by mouth every 6 (six) hours as needed for pain (For headache.).          BP 129/75  Pulse 95  Temp(Src) 99.9 F (37.7 C) (Oral)  Resp 14  SpO2 99% Physical Exam  Nursing note and vitals reviewed. Constitutional: She appears well-developed and well-nourished.  HENT:  Head: Normocephalic and atraumatic.  Eyes: Conjunctivae are normal. Right eye exhibits no discharge. Left eye exhibits no discharge.  Neck: Normal range of motion. Neck supple. No tracheal deviation present.  Cardiovascular: Normal rate and regular rhythm.   Pulmonary/Chest: Effort normal and breath sounds normal.  Musculoskeletal: She exhibits no edema.  Neurological: She is alert.  Skin: Skin is warm. No rash noted.  Psychiatric: She has a normal mood and affect.    ED Course  Procedures (including critical care time) Labs Review Labs Reviewed - No data to display Imaging Review No results found.  EKG Interpretation   None  MDM   1. Flu-like symptoms   2. Cough    Well appearing. Pt thought she needed tamiflu, discussed in healthy people with no red flags who are going home It was not indicated, she agrees and understands.  Results and differential diagnosis were discussed with the patient. Close follow up outpatient was discussed, patient comfortable with the plan.   Diagnosis: above    Enid Skeens, MD 11/26/13 1034

## 2013-11-26 NOTE — ED Notes (Signed)
Pt states that there are other people in her house with the flu.  States that she started having cough and body aches last night.

## 2013-12-05 ENCOUNTER — Encounter: Payer: Self-pay | Admitting: Obstetrics & Gynecology

## 2014-03-30 ENCOUNTER — Encounter (HOSPITAL_COMMUNITY): Payer: Self-pay | Admitting: Emergency Medicine

## 2014-03-30 ENCOUNTER — Emergency Department (HOSPITAL_COMMUNITY)
Admission: EM | Admit: 2014-03-30 | Discharge: 2014-03-30 | Disposition: A | Discharge status: Home / Self Care | Payer: Medicaid Other | Attending: Emergency Medicine | Admitting: Emergency Medicine

## 2014-03-30 DIAGNOSIS — R220 Localized swelling, mass and lump, head: Secondary | ICD-10-CM

## 2014-03-30 DIAGNOSIS — K044 Acute apical periodontitis of pulpal origin: Secondary | ICD-10-CM | POA: Insufficient documentation

## 2014-03-30 DIAGNOSIS — F17200 Nicotine dependence, unspecified, uncomplicated: Secondary | ICD-10-CM | POA: Insufficient documentation

## 2014-03-30 DIAGNOSIS — J45909 Unspecified asthma, uncomplicated: Secondary | ICD-10-CM | POA: Insufficient documentation

## 2014-03-30 DIAGNOSIS — Z862 Personal history of diseases of the blood and blood-forming organs and certain disorders involving the immune mechanism: Secondary | ICD-10-CM | POA: Insufficient documentation

## 2014-03-30 DIAGNOSIS — K047 Periapical abscess without sinus: Secondary | ICD-10-CM

## 2014-03-30 DIAGNOSIS — Z8659 Personal history of other mental and behavioral disorders: Secondary | ICD-10-CM | POA: Insufficient documentation

## 2014-03-30 DIAGNOSIS — K0381 Cracked tooth: Secondary | ICD-10-CM | POA: Insufficient documentation

## 2014-03-30 DIAGNOSIS — Z8639 Personal history of other endocrine, nutritional and metabolic disease: Secondary | ICD-10-CM | POA: Insufficient documentation

## 2014-03-30 DIAGNOSIS — K029 Dental caries, unspecified: Secondary | ICD-10-CM | POA: Insufficient documentation

## 2014-03-30 MED ORDER — AMOXICILLIN 500 MG PO CAPS: 500.0000 mg | ORAL_CAPSULE | Freq: Three times a day (TID) | ORAL | Status: DC

## 2014-03-30 NOTE — ED Notes (Signed)
Pt reports that her mouth has been sore for the past couple of days. Pt reports that she has knots on the inside of her mouth. Denies any drainage or fever.

## 2014-03-30 NOTE — Discharge Instructions (Signed)
Dental Care and Dentist Visits Dental care supports good overall health. Regular dental visits can also help you avoid dental pain, bleeding, infection, and other more serious health problems in the future. It is important to keep the mouth healthy because diseases in the teeth, gums, and other oral tissues can spread to other areas of the body. Some problems, such as diabetes, heart disease, and pre-term labor have been associated with poor oral health.  See your dentist every 6 months. If you experience emergency problems such as a toothache or broken tooth, go to the dentist right away. If you see your dentist regularly, you may catch problems early. It is easier to be treated for problems in the early stages.  WHAT TO EXPECT AT A DENTIST VISIT  Your dentist will look for many common oral health problems and recommend proper treatment. At your regular dental visit, you can expect:  Gentle cleaning of the teeth and gums. This includes scraping and polishing. This helps to remove the sticky substance around the teeth and gums (plaque). Plaque forms in the mouth shortly after eating. Over time, plaque hardens on the teeth as tartar. If tartar is not removed regularly, it can cause problems. Cleaning also helps remove stains.  Periodic X-rays. These pictures of the teeth and supporting bone will help your dentist assess the health of your teeth.  Periodic fluoride treatments. Fluoride is a natural mineral shown to help strengthen teeth. Fluoride treatmentinvolves applying a fluoride gel or varnish to the teeth. It is most commonly done in children.  Examination of the mouth, tongue, jaws, teeth, and gums to look for any oral health problems, such as:  Cavities (dental caries). This is decay on the tooth caused by plaque, sugar, and acid in the mouth. It is best to catch a cavity when it is small.  Inflammation of the gums caused by plaque buildup (gingivitis).  Problems with the mouth or malformed  or misaligned teeth.  Oral cancer or other diseases of the soft tissues or jaws. KEEP YOUR TEETH AND GUMS HEALTHY For healthy teeth and gums, follow these general guidelines as well as your dentist's specific advice:  Have your teeth professionally cleaned at the dentist every 6 months.  Brush twice daily with a fluoride toothpaste.  Floss your teeth daily.  Ask your dentist if you need fluoride supplements, treatments, or fluoride toothpaste.  Eat a healthy diet. Reduce foods and drinks with added sugar.  Avoid smoking. TREATMENT FOR ORAL HEALTH PROBLEMS If you have oral health problems, treatment varies depending on the conditions present in your teeth and gums.  Your caregiver will most likely recommend good oral hygiene at each visit.  For cavities, gingivitis, or other oral health disease, your caregiver will perform a procedure to treat the problem. This is typically done at a separate appointment. Sometimes your caregiver will refer you to another dental specialist for specific tooth problems or for surgery. SEEK IMMEDIATE DENTAL CARE IF:  You have pain, bleeding, or soreness in the gum, tooth, jaw, or mouth area.  A permanent tooth becomes loose or separated from the gum socket.  You experience a blow or injury to the mouth or jaw area. Document Released: 07/15/2011 Document Revised: 01/25/2012 Document Reviewed: 07/15/2011 [facility] Patient Information 2014 [place], [place].  Facial Infection You have an infection of your face. This requires special attention to help prevent serious problems. Infections in facial wounds can cause poor healing and scars. They can also spread to deeper tissues, especially around  the eye. Wound and dental infections can lead to sinusitis, infection of the eye socket, and even meningitis. Permanent damage to the skin, eye, and nervous system may result if facial infections are not treated properly. With severe infections, hospital care for IV  antibiotic injections may be needed if they don't respond to oral antibiotics. Antibiotics must be taken for the full course to insure the infection is eliminated. If the infection came from a bad tooth, it may have to be extracted when the infection is under control. Warm compresses may be applied to reduce skin irritation and remove drainage. You might need a tetanus shot now if:  You cannot remember when your last tetanus shot was.  You have never had a tetanus shot.  The object that caused your wound was dirty. If you need a tetanus shot, and you decide not to get one, there is a rare chance of getting tetanus. Sickness from tetanus can be serious. If you got a tetanus shot, your arm may swell, get red and warm to the touch at the shot site. This is common and not a problem. SEEK IMMEDIATE MEDICAL CARE IF:   You have increased swelling, redness, or trouble breathing.  You have a severe headache, dizziness, nausea, or vomiting.  You develop problems with your eyesight.  You have a fever. Document Released: 12/10/2004 Document Revised: 01/25/2012 Document Reviewed: 11/02/2005 [facility] Patient Information 2014 [place], [place].  Diet and Dental Disease What you eat affects the health of your teeth. Diet plays an important role in developing healthy teeth and preventing dental disease, such as:  Tooth decay.  Gum (periodontal) disease.  Developmental defects of the enamel. This is when visible surfaces of the tooth do not form properly, leaving the tooth more prone to decay.  Dental erosion. This is when the teeth wear away. Knowing which foods promote strong teeth and which foods to stay away from can help you prevent poor oral health. If your diet lacks proper nutrients, it may be difficult for the tissues in your mouth to prevent dental disease. FOODS THAT PROMOTE DENTAL DISEASE The following foods either contain acids or create acid in your mouth that increases the risk of tooth  decay:  Sugary foods, such as candy and baked goods (cookies, cake).  Soft drinks (carbonated and non-carbonated) such as soda, sports drinks, and fruit juice.  Citrus fruits, such as oranges and lemons.  Berries.  Honey.  Herbal teas that contain berries and other fruits.  Wines and other alcoholic beverages.  Vinegar or vinegar containing foods, such as pickles.  Starchy snacks such as crackers, potato chips, [place] fries, and pasta. Some of these foods have health benefits. Eat these foods in moderation. The more often you eat these foods, the more frequently you are exposing your teeth to the acid that causes dental diseases. FOODS THAT REDUCE THE RISK OF DENTAL DISEASE Certain foods help to keep the teeth strong and reduce the risk of tooth decay. These foods include:  Dairy products, such as cow's milk and cheese. Eating dairy with a meal or sugary snack reduces the risk of tooth decay.  Gums and foods that substitute sugar with sorbitol, mannitol, and xylitol.  Fluoride containing foods, such as black tea. Fluoride is a natural mineral that protects the teeth from tooth decay. Your caregiver may recommend fluoride toothpaste or a fluoride supplement.  Breast milk. DIETARY RECOMMENDATIONS FOR HEALTHY TEETH  Eat a healthy, well-balanced diet with fiber-rich fruits and vegetables and quality proteins (  eggs, meat, poultry, and fish). A variety of foods each day in moderation is best.  Avoid frequent sugary snacks in between meals.  Avoid frequent sticky, chewy, sugary candies, such as gummy bears and other candies that stick to the teeth. Avoid sucking on candies for a long time.  Avoid drinks that contain added sugar. Even though they do not sit in the mouth for very long, they can promote tooth decay if consumed too frequently.  Avoid sugary foods and drinks late at night.  Avoid swishing or holding acidic or sugary drinks in your mouth. Using a straw limits contact  with the teeth.  If you like frequent sugary treats, try eating a sugary dessert after a meal or with a dairy product, rather than eating it by itself.  Avoid starchy foods such as graham crackers that stick to your teeth.  Eat highly acidic and sugary foods in moderation, especially if you tend to develop tooth decay. Eat citrus fruits or drinks 2 times per day or less. Limit foods with vinegar and sports drinks to 1 time per week.  Try rinsing your mouth with water after a sugary or acidic meal or drink. Rinsing may help to reduce the acid buildup in the mouth.  Limit alcohol.  Read labels to determine the amount of sugar in foods. PRACTICE GOOD DAILY ORAL HYGIENE   Have your teeth professionally cleaned at the dentist every 6 months.  Brush twice daily with a fluoride toothpaste.  Floss between your teeth daily.  Ask your caregiver if you need fluoride supplements or treatments.  Ask your caregiver if you should have sealants applied to some of your teeth. HOME CARE INSTRUCTIONS  Follow the guidelines included here to promote good oral health.  Follow all of your caregiver's instructions for managing your health condition(s).  See your caregiver for follow-up exams as directed. Document Released: 07/01/2011 Document Revised: 01/25/2012 Document Reviewed: 07/01/2011 [facility] Patient Information 2014 [place], [place].

## 2014-03-30 NOTE — ED Notes (Signed)
States her pain is in  bilateral jaws. States has "lumps" in left jaw that are tender.

## 2014-03-30 NOTE — ED Provider Notes (Signed)
CSN: [id number]     Arrival date & time 03/30/14  1013 History  This chart was scribed for non-physician practitioner, Arthor CaptainAbigail Amanda Steuart, PA-C working with Doug SouSam Jacubowitz, MD by Greggory StallionKayla Andersen, ED scribe. This patient was seen in room TR06C/TR06C and the patient's care was started at 10:43 AM.   Chief Complaint  Patient presents with  . Dental Pain    mouth   The history is provided by the patient. No language interpreter was used.   HPI Comments: Breanna [name] is a 28 y.o. female who presents to the Emergency Department complaining of gradual onset left sided dental problem with associated facial swelling that started 2 days ago. She states she is not having dental pain but she has knots below her teeth. Denies injury. Denies fever, night sweats, abnormal weight loss, pain with swallowing, nausea.   Past Medical History  Diagnosis Date  . Asthma   . Polycystic ovarian syndrome   . Depression     not current was 10 years ago   Past Surgical History  Procedure Laterality Date  . No past surgeries    . Wisdom tooth extraction     Family History  Problem Relation Age of Onset  . Bipolar disorder Father   . Alcohol abuse Father   . Heart disease Brother   . Dementia Paternal Uncle   . Heart disease Maternal Grandmother     a fib  . Cancer Maternal Grandmother     breast  . Dementia Maternal Grandfather   . Hyperlipidemia Maternal Grandfather   . Diabetes Maternal Grandfather    History  Substance Use Topics  . Smoking status: Current Some Day Smoker -- 0.50 packs/day    Types: Cigarettes  . Smokeless tobacco: Never Used  . Alcohol Use: No   OB History   Grav Para Term Preterm Abortions TAB SAB Ect Mult Living   2 2 2       2      Review of Systems  Constitutional: Negative for fever and unexpected weight change.  HENT: Positive for dental problem and facial swelling.   Eyes: Negative for redness.  Respiratory: Negative for shortness of breath.   Cardiovascular:  Negative for chest pain.  Gastrointestinal: Negative for nausea.  Musculoskeletal: Negative for gait problem.  Skin: Negative for rash.  Neurological: Negative for speech difficulty.  Psychiatric/Behavioral: Negative for confusion.   Allergies  Iodine; Morphine and related; Avelox; and Percocet  Home Medications   Prior to Admission medications   Medication Sig Start Date End Date Taking? Authorizing Provider  acetaminophen (TYLENOL) 500 MG tablet Take 500-1,000 mg by mouth every 6 (six) hours as needed for pain (For headache.).    Historical Provider, MD   BP 122/76  Pulse 82  Temp(Src) 97.8 F (36.6 C) (Oral)  Resp 18  Ht 5\' 7"  (1.702 m)  Wt 274 lb (124.286 kg)  BMI 42.90 kg/m2  SpO2 98%  Physical Exam  Nursing note and vitals reviewed. Constitutional: She is oriented to person, place, and time. She appears well-developed and well-nourished. No distress.  HENT:  Head: Normocephalic and atraumatic.  Mouth/Throat: Oropharynx is clear and moist.  Second premolar on the left is fractured with some open decay. Nodular swelling along the left mandible. Tender to palpation. No apparent blockage of salivary ducts. No swelling under the tongue.   Eyes: EOM are normal.  Neck: Neck supple. No tracheal deviation present.  Cardiovascular: Normal rate.   Pulmonary/Chest: Effort normal. No respiratory distress.  Musculoskeletal: Normal range of motion.  Neurological: She is alert and oriented to person, place, and time.  Skin: Skin is warm and dry.  Psychiatric: She has a normal mood and affect. Her behavior is normal.    ED Course  Procedures (including critical care time)  DIAGNOSTIC STUDIES: Oxygen Saturation is 98% on RA, normal by my interpretation.    COORDINATION OF CARE: 10:46 AM-Discussed treatment plan which includes amoxicillin with pt at bedside and pt agreed to plan. Will give pt dental referrals and advised her to follow up.  Labs Review Labs Reviewed - No data  to display  Imaging Review No results found.   EKG Interpretation None      MDM   Final diagnoses:  Dental infection  Left facial swelling    Patient with toothache.  No gross abscess.  Exam unconcerning for Ludwig's angina or spread of infection.  Will treat with penicillin and pain medicine.  Urged patient to follow-up with dentist.     I personally performed the services described in this documentation, which was scribed in my presence. The recorded information has been reviewed and is accurate.  Arthor CaptainAbigail Lizmary Nader, PA-C 03/30/14 1432

## 2014-03-30 NOTE — ED Provider Notes (Signed)
Medical screening examination/treatment/procedure(s) were performed by non-physician practitioner and as supervising physician I was immediately available for consultation/collaboration.   EKG Interpretation None       Baldwin Racicot, MD 03/30/14 1624 

## 2014-04-10 ENCOUNTER — Emergency Department (HOSPITAL_COMMUNITY)
Admission: EM | Admit: 2014-04-10 | Discharge: 2014-04-10 | Discharge status: Against Medical Advice | Payer: Medicaid Other | Attending: Emergency Medicine | Admitting: Emergency Medicine

## 2014-04-10 ENCOUNTER — Encounter (HOSPITAL_COMMUNITY): Payer: Self-pay | Admitting: Emergency Medicine

## 2014-04-10 DIAGNOSIS — F17200 Nicotine dependence, unspecified, uncomplicated: Secondary | ICD-10-CM | POA: Insufficient documentation

## 2014-04-10 DIAGNOSIS — J45909 Unspecified asthma, uncomplicated: Secondary | ICD-10-CM | POA: Insufficient documentation

## 2014-04-10 DIAGNOSIS — M79609 Pain in unspecified limb: Secondary | ICD-10-CM | POA: Insufficient documentation

## 2014-04-10 NOTE — ED Notes (Signed)
Pt reports woke up this morning and noticed when she moves her L leg a certain way she gets sharp pain to lower left leg. Pt denies sob, recent travel. Pt does have birth control implant since last June. Pt denies injury to leg.

## 2014-04-10 NOTE — ED Notes (Signed)
Pt came to nurse first desk and stated that she could not wait any longer because she needed to go to work.  Pt stated she would return in the morning for care.

## 2014-06-18 ENCOUNTER — Telehealth: Payer: Self-pay | Admitting: *Deleted

## 2014-06-18 NOTE — Telephone Encounter (Signed)
Patient states she has the Nexplanon and been on her cycle for 17 days. 2:22 Attempt to call patient back- wrong #. Cell # in contact not active. Left message on home # to CB.

## 2014-06-19 ENCOUNTER — Other Ambulatory Visit: Payer: Self-pay | Admitting: *Deleted

## 2014-06-19 ENCOUNTER — Other Ambulatory Visit: Payer: Self-pay | Admitting: Obstetrics

## 2014-06-19 DIAGNOSIS — N939 Abnormal uterine and vaginal bleeding, unspecified: Secondary | ICD-10-CM

## 2014-06-19 MED ORDER — LEVONORGESTREL-ETHINYL ESTRAD 0.15-30 MG-MCG PO TABS: 3.0000 | ORAL_TABLET | Freq: Every day | ORAL | Status: DC

## 2014-06-19 NOTE — Telephone Encounter (Signed)
Per Dr Clearance CootsHarper:  Nordette 3/d for 1 week then 1/d for 1 pack. Rx called to pharmacy.

## 2014-06-19 NOTE — Telephone Encounter (Signed)
Patient called back- #[phone]- #[phone]   Patient states she has had the Nexplanon for over 1 year and this is the first bleeding she has had. Patient states she started bleeding 17 days ago with moderate flow for 13 of those days. Told patient would speak to provider and send Rx to the pharmacy.

## 2014-06-19 NOTE — Telephone Encounter (Signed)
Patient only needs 2 packs of pills- short term Rx for AUB

## 2014-08-02 IMAGING — US US OB COMP +14 WK
1 series · 12 of 28 positions shown · non-contrast
Comparison: none

[Series 1: us ob comp +14 wk · 81 acquisitions, 12 frames shown]
[im 3/81]
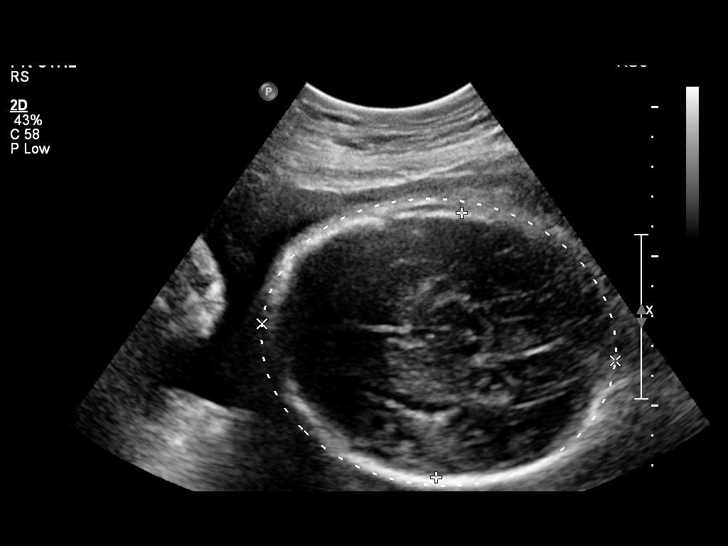
[im 9/81]
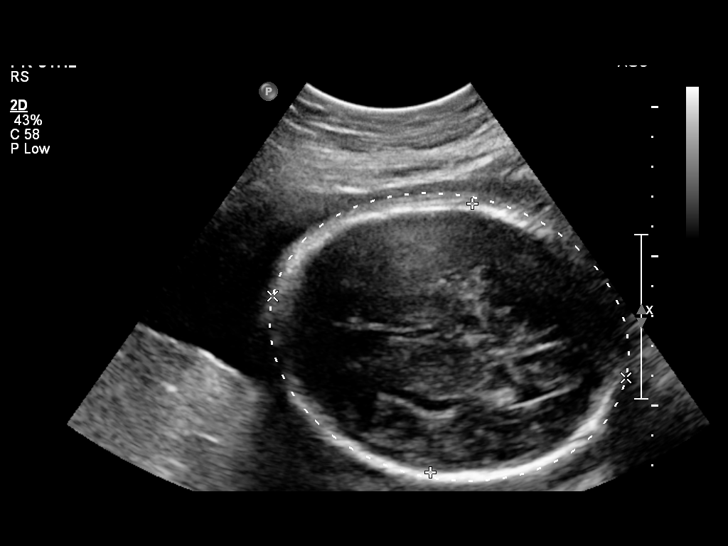
[im 15/81]
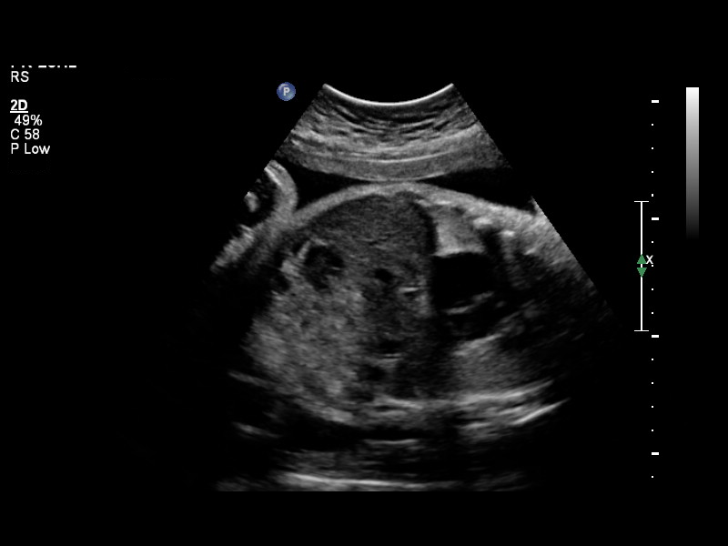
[im 24/81]
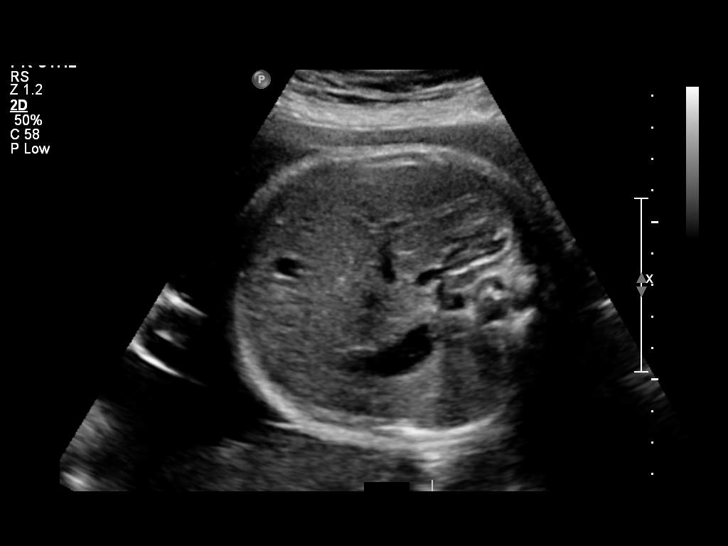
[im 30/81]
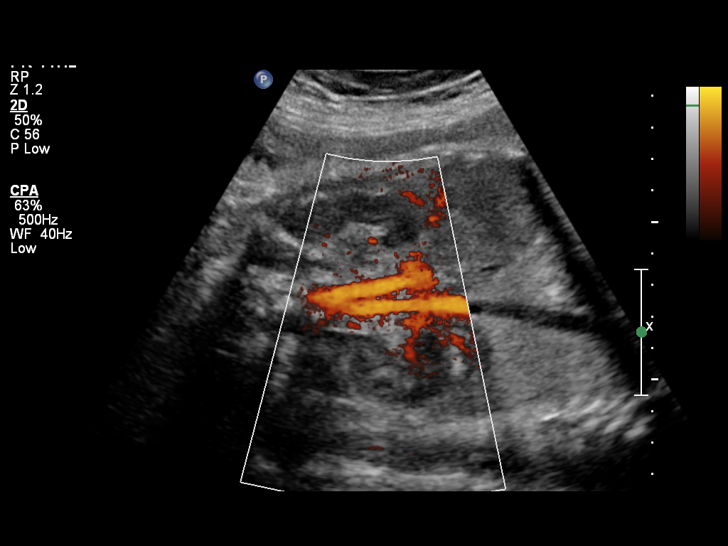
[im 36/81]
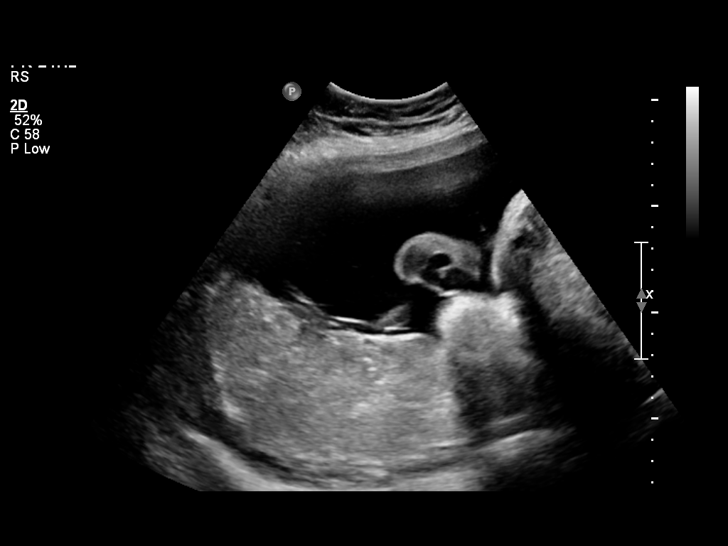
[im 45/81]
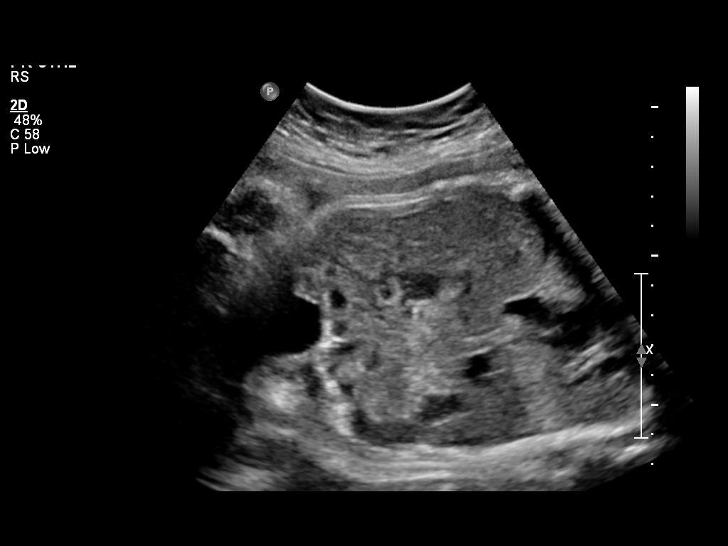
[im 51/81]
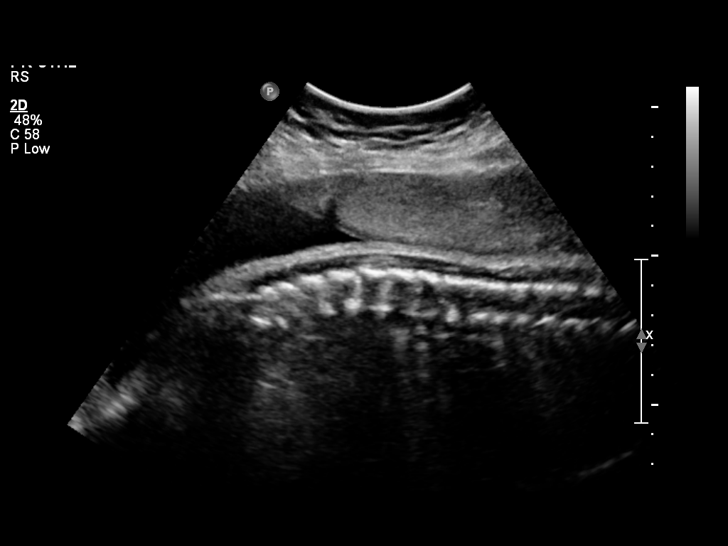
[im 57/81]
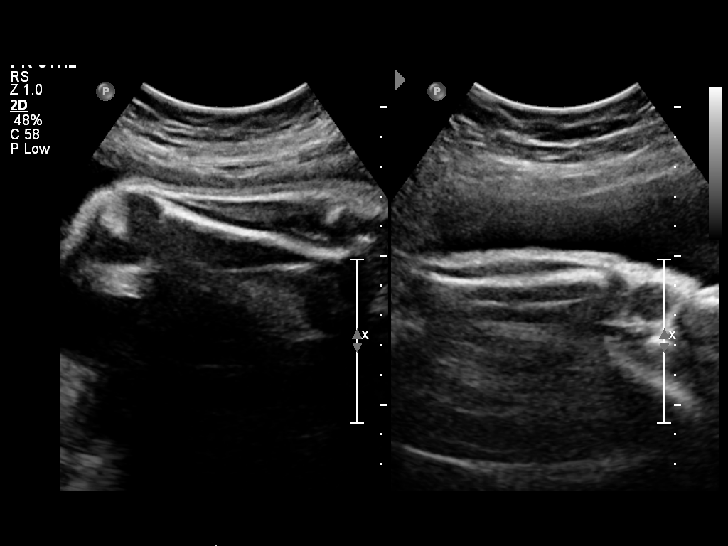
[im 66/81]
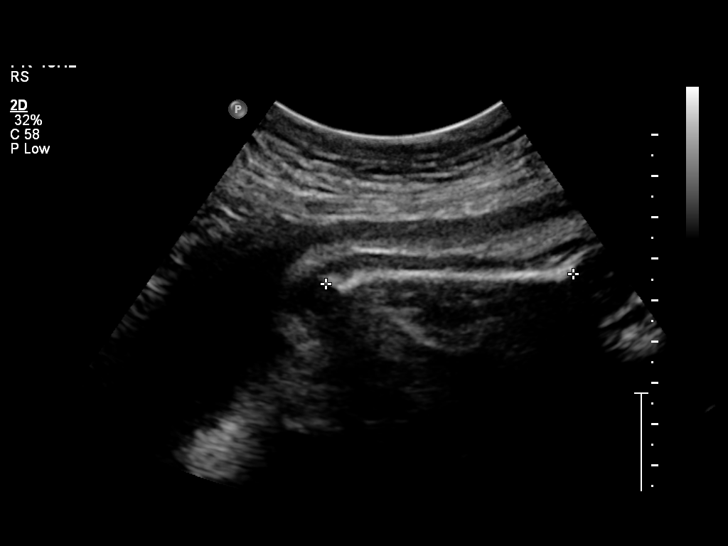
[im 72/81]
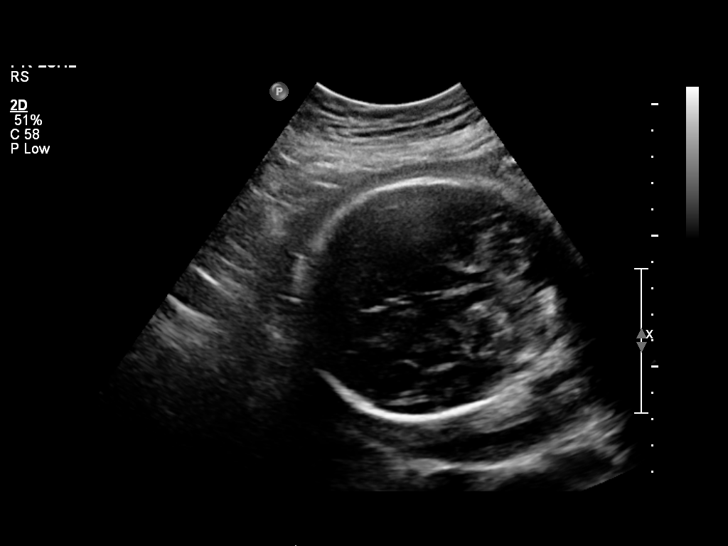
[im 78/81]
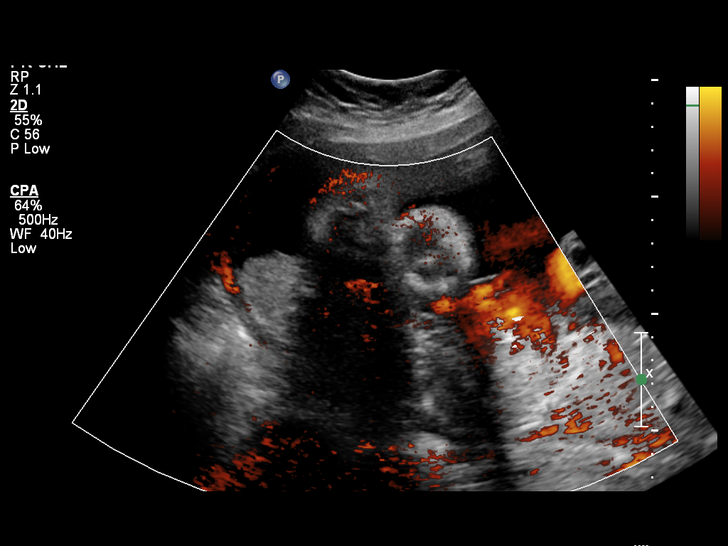

[12 of 28 positions shown; findings below may reference images not displayed]

OBSTETRICS REPORT
                      (Signed Final 02/17/2013 [DATE])

Service(s) Provided

 US OB COMP + 14 WK                                    76805.1
Indications

 Polyhydramnios
 Size greater than dates (Large for gestational [AGE]
Fetal Evaluation

 Num Of Fetuses:    1
 Fetal Heart Rate:  121                         bpm
 Cardiac Activity:  Observed
 Presentation:      Cephalic
 Placenta:          Posterior, above cervical
                    os
 P. Cord            Visualized, central
 Insertion:

 Amniotic Fluid
 AFI FV:      Polyhydramnios
 AFI Sum:     25.73   cm      96   %Tile     Larg Pckt:   8.15   cm
 RUQ:   8.15   cm    RLQ:    7.02   cm    LUQ:   4.46    cm   LLQ:    6.1    cm
Biometry

 BPD:     89.2  mm    G. Age:   36w 1d                CI:        70.46   70 - 86
                                                      FL/HC:      20.0   19.4 -

 HC:     338.8  mm    G. Age:   39w 0d     > 97  %    HC/AC:      1.03   0.96 -

 AC:       328  mm    G. Age:   36w 5d     > 97  %    FL/BPD:     75.8   71 - 87
 FL:      67.6  mm    G. Age:   34w 5d       57  %    FL/AC:      20.6   20 - 24
 HUM:     58.6  mm    G. Age:   33w 6d       55  %

 Est. FW:    3534  gm      6 lb 7 oz   > 90  %
Gestational Age

 U/S Today:     36w 4d                                        EDD:   03/13/13
 Best:          34w 1d    Det. By:   Previous Ultrasound      EDD:   03/30/13
Anatomy
 Cranium:          Appears normal         Aortic Arch:      Basic anatomy
                                                            exam per order
 Fetal Cavum:      Appears normal         Ductal Arch:      Basic anatomy
                                                            exam per order
 Ventricles:       Appears normal         Diaphragm:        Appears normal
 Choroid Plexus:   Appears normal         Stomach:          Appears normal
 Cerebellum:       Appears normal         Abdomen:          Appears normal
 Posterior Fossa:  Appears normal         Abdominal Wall:   Appears nml (cord
                                                            insert, abd wall)
 Nuchal Fold:      Not applicable (>20    Cord Vessels:     Appears normal (3
                   wks GA)                                  vessel cord)
 Face:             Orbits appear          Kidneys:          Appear normal
                   normal
 Lips:             Appears normal         Bladder:          Appears normal
 Heart:            Appears normal         Spine:            Appears normal
                   (4CH, axis, and
                   situs)
 RVOT:             Appears normal         Lower             Appears normal
                                          Extremities:
 LVOT:             Appears normal         Upper             Appears normal
                                          Extremities:

 Other:  Fetus appears  to be male.  Technically difficult due to maternal
         habitus, advanced GA and fetal position.
Cervix Uterus Adnexa

 Cervix:       Not visualized (advanced GA >34 wks)
 Uterus:       No abnormality visualized.
 Cul De Sac:   No free fluid seen.
 Left Ovary:   Within normal limits.
 Right Ovary:  Not visualized.

 Adnexa:     No abnormality visualized.
Impression

 Single intrauterine gestation demonstrating an estimated
 gestational age by ultrasound of 36w 4d. This is correlated
 with expected estimated gestational age by prior ultrasound
 of 34w 1d. EFW is currently at the > 90% and compatible with
 a large for gestational age fetus. Continued close follow up
 for growth is recommended to exclude developing fetal
 macrosomia.

 Visualized fetal anatomy appears normal. Visualized portions
 of the posterior placenta appear normal.

 Subjectively and quantitatively increased amniotic fluid
 volume with an AFI at the 96%.

 questions or concerns.

## 2014-08-10 ENCOUNTER — Emergency Department (HOSPITAL_COMMUNITY)
Admission: EM | Admit: 2014-08-10 | Discharge: 2014-08-10 | Disposition: A | Discharge status: Home / Self Care | Payer: Medicaid Other | Attending: Emergency Medicine | Admitting: Emergency Medicine

## 2014-08-10 ENCOUNTER — Encounter (HOSPITAL_COMMUNITY): Payer: Self-pay | Admitting: Emergency Medicine

## 2014-08-10 DIAGNOSIS — E282 Polycystic ovarian syndrome: Secondary | ICD-10-CM

## 2014-08-10 DIAGNOSIS — R5383 Other fatigue: Secondary | ICD-10-CM

## 2014-08-10 DIAGNOSIS — N898 Other specified noninflammatory disorders of vagina: Secondary | ICD-10-CM | POA: Insufficient documentation

## 2014-08-10 DIAGNOSIS — R5381 Other malaise: Secondary | ICD-10-CM | POA: Diagnosis not present

## 2014-08-10 DIAGNOSIS — F17200 Nicotine dependence, unspecified, uncomplicated: Secondary | ICD-10-CM | POA: Insufficient documentation

## 2014-08-10 DIAGNOSIS — J45909 Unspecified asthma, uncomplicated: Secondary | ICD-10-CM | POA: Insufficient documentation

## 2014-08-10 DIAGNOSIS — Z8659 Personal history of other mental and behavioral disorders: Secondary | ICD-10-CM | POA: Diagnosis not present

## 2014-08-10 DIAGNOSIS — Z792 Long term (current) use of antibiotics: Secondary | ICD-10-CM | POA: Diagnosis not present

## 2014-08-10 DIAGNOSIS — K002 Abnormalities of size and form of teeth: Secondary | ICD-10-CM | POA: Diagnosis not present

## 2014-08-10 DIAGNOSIS — K089 Disorder of teeth and supporting structures, unspecified: Secondary | ICD-10-CM | POA: Insufficient documentation

## 2014-08-10 DIAGNOSIS — K029 Dental caries, unspecified: Secondary | ICD-10-CM

## 2014-08-10 DIAGNOSIS — N939 Abnormal uterine and vaginal bleeding, unspecified: Secondary | ICD-10-CM

## 2014-08-10 LAB — I-STAT CHEM 8, ED
BUN: 5 mg/dL — ABNORMAL LOW (ref 6–23)
CALCIUM ION: 1.21 mmol/L (ref 1.12–1.23)
CREATININE: 0.7 mg/dL (ref 0.50–1.10)
Chloride: 105 mEq/L (ref 96–112)
GLUCOSE: 89 mg/dL (ref 70–99)
HCT: 44 % (ref 36.0–46.0)
HEMOGLOBIN: 15 g/dL (ref 12.0–15.0)
POTASSIUM: 4 meq/L (ref 3.7–5.3)
Sodium: 140 mEq/L (ref 137–147)
TCO2: 23 mmol/L (ref 0–100)

## 2014-08-10 MED ORDER — AMOXICILLIN-POT CLAVULANATE 875-125 MG PO TABS: 1.0000 | ORAL_TABLET | Freq: Two times a day (BID) | ORAL | Status: DC

## 2014-08-10 NOTE — ED Provider Notes (Signed)
28 y.o. Female complaining of tooth pain and vaginal bleeding Obese female nad Results for orders placed during the hospital encounter of 08/10/14  I-STAT CHEM 8, ED      Result Value Ref Range   Sodium 140  137 - 147 mEq/L   Potassium 4.0  3.7 - 5.3 mEq/L   Chloride 105  96 - 112 mEq/L   BUN 5 (*) 6 - 23 mg/dL   Creatinine, Ser 1.61  0.50 - 1.10 mg/dL   Glucose, Bld 89  70 - 99 mg/dL   Calcium, Ion 0.96  0.45 - 1.23 mmol/L   TCO2 23  0 - 100 mmol/L   Hemoglobin 15.0  12.0 - 15.0 g/dL   HCT 40.9  81.1 - 91.4 %    I performed a history and physical examination of Coraleigh E J Runkles and discussed her management wRAY,Takeo Harts S  I saw and evaluated the patient, reviewed the resident's note and I agree with the findings and plan.   Hilario Quarry, MD 08/10/14 [phone]

## 2014-08-10 NOTE — Discharge Instructions (Signed)
Abnormal Uterine Bleeding °Abnormal uterine bleeding can affect women at various stages in life, including teenagers, women in their reproductive years, pregnant women, and women who have reached menopause. Several kinds of uterine bleeding are considered abnormal, including: °· Bleeding or spotting between periods.   °· Bleeding after sexual intercourse.   °· Bleeding that is heavier or more than normal.   °· Periods that last longer than usual. °· Bleeding after menopause.   °Many cases of abnormal uterine bleeding are minor and simple to treat, while others are more serious. Any type of abnormal bleeding should be evaluated by your health care provider. Treatment will depend on the cause of the bleeding. °HOME CARE INSTRUCTIONS °Monitor your condition for any changes. The following actions may help to alleviate any discomfort you are experiencing: °· Avoid the use of tampons and douches as directed by your health care provider. °· Change your pads frequently. °You should get regular pelvic exams and Pap tests. Keep all follow-up appointments for diagnostic tests as directed by your health care provider.  °SEEK MEDICAL CARE IF:  °· Your bleeding lasts more than 1 week.   °· You feel dizzy at times.   °SEEK IMMEDIATE MEDICAL CARE IF:  °· You pass out.   °· You are changing pads every 15 to 30 minutes.   °· You have abdominal pain. °· You have a fever.   °· You become sweaty or weak.   °· You are passing large blood clots from the vagina.   °· You start to feel nauseous and vomit. °MAKE SURE YOU:  °· Understand these instructions. °· Will watch your condition. °· Will get help right away if you are not doing well or get worse. °Document Released: 11/02/2005 Document Revised: 11/07/2013 Document Reviewed: 06/01/2013 °ExitCare® Patient Information ©2015 [employer]. This information is not intended to replace advice given to you by your health care provider. Make sure you discuss any questions you have with your  health care provider. ° ° °Emergency Department Resource Guide °1) Find a Doctor and Pay Out of Pocket °Although you won't have to find out who is covered by your insurance plan, it is a good idea to ask around and get recommendations. You will then need to call the office and see if the doctor you have chosen will accept you as a new patient and what types of options they offer for patients who are self-pay. Some doctors offer discounts or will set up payment plans for their patients who do not have insurance, but you will need to ask so you aren't surprised when you get to your appointment. ° °2) Contact Your Local Health Department °Not all health departments have doctors that can see patients for sick visits, but many do, so it is worth a call to see if yours does. If you don't know where your local health department is, you can check in your phone book. The CDC also has a tool to help you locate your state's health department, and many state websites also have listings of all of their local health departments. ° °3) Find a Walk-in Clinic °If your illness is not likely to be very severe or complicated, you may want to try a walk in clinic. These are popping up all over the country in pharmacies, drugstores, and shopping centers. They're usually staffed by nurse practitioners or physician assistants that have been trained to treat common illnesses and complaints. They're usually fairly quick and inexpensive. However, if you have serious medical issues or chronic medical problems, these are probably not your   best option. ° °No Primary Care Doctor: °- Call [facility] at  832-8000 - they can help you locate a primary care doctor that  accepts your insurance, provides certain services, etc. °- Physician Referral Service- 1-800-533-3463 ° °Chronic Pain Problems: °Organization         Address  Phone   Notes  °[facility]  (336) 297-2271 Patients need to be referred by their primary care doctor.   ° °Medication Assistance: °Organization         Address  Phone   Notes  °[place] [facility] [address] °[place], [place] [address] (336) 641-8030 --Must be a resident of [place] °-- Must have NO insurance coverage whatsoever (no Medicaid/ Medicare, etc.) °-- The pt. MUST have a primary care doctor that directs their care regularly and follows them in the community °  °[facility]  (866) 331-1348   °[employer]  (888) 892-1162   ° °Agencies that provide inexpensive medical care: °Organization         Address  Phone   Notes  °[facility]  (336) 832-8035   °[facility]    (336) 832-7272   °[facility] [address] °[place], [place] [address] (336) 832-4777   °[facility] [address], °[place] (336) 271-4999   °[facility]    (336) 373-0678   °[facility]    (336) 272-1050   °[facility] ° [address], [place] Phone:  (336) 832-4444, Fax:  (336) 832-4440 Hours of Operation:  9 am - 6 pm, M-F.  Also accepts Medicaid/Medicare and self-pay.  °[facility] ° [address], [place] Phone: (336) 832-3150, Fax: (336) 832-3151. Hours of Operation:  8:30 am - 5:30 pm, M-F.  Also accepts Medicaid and self-pay.  °[facility] [place] [address], [place] Phone: (336) 878-6027   °[facility] [address], [place], [place] (336)723-1848, Ext. 123 Mondays & Thursdays: 7-9 AM.  First 15 patients are seen on a first come, first serve basis. °  ° °Medicaid-accepting [place] Providers: ° °Organization         Address  Phone   Notes  °[facility] [address], [place] (336) 641-2100 Also accepts self-pay patients.  °[facility] [address], [place] ° (336) 856-9996   °[facility] [address], [place] (336) 288-8857   °[facility] [address] [place] [address], [place] (336) 299-7000   °Veita Bland [address], [place]  ° (336) 373-1557 Only accepts [place] Access Medicaid patients after they have their name applied to their card.  ° °Self-Pay (no insurance) in [place]: ° °Organization         Address  Phone   Notes  °Sickle Cell Patients, [facility] [address], [place] (336) 832-1970   °[facility] [address], [place] (336) 832-4400   °[facility] ° [address], [place] (336) 992-4800   °[facility]/Dr. Osei-Bonsu ° [address] [place] [address], [place] or [address], [place] (336) 841-8500 Phone number for both [place] and [place] locations is the same.  °[facility] [address],   [place] (336) 299-0000   °[facility] [address] [place] [address], [place] or [address] (336) 852-7530 °(336) 878-2260   °[facility] [address], [place] (336) 350-1642, phone; (336) 294-5005, fax Sees patients 1st and 3rd Saturday of every month.  Must not qualify for public or private insurance (i.e. Medicaid, Medicare, [place] Health Choice, Veterans' Benefits) • Household income should be no more than 200% of the poverty level •The clinic cannot treat you if you are pregnant or think you are pregnant • Sexually transmitted diseases are not treated at the clinic.  ° ° °Dental Care: °Organization         Address  Phone  Notes  °[facility] [facility] [address], [place] (336) 641-6152 Accepts children up to age 21 who are enrolled in Medicaid or [place] Health Choice; pregnant women with a Medicaid card; and children who have applied for Medicaid or [place] Health Choice, but were declined, whose parents can pay a reduced fee at time of service.    °[facility] [place]  [address], [place] (336) 641-7733 Accepts children up to age 21 who are enrolled in Medicaid or [place] Health Choice; pregnant women with a Medicaid card; and children who have applied for Medicaid or [place] Health Choice, but were declined, whose parents can pay a reduced fee at time of service.  °[facility] ° [address], [place] (336) 641-4533 Patients are seen by appointment only. Walk-ins are not accepted. [facility] will see patients 18 years of age and older. °Monday - Tuesday (8am-5pm) °Most Wednesdays (8:30-5pm) °$30 per visit, cash only  °[facility] ° [address], [place] (336) 641-4533 Patients are seen by appointment only. Walk-ins are not accepted. [facility] will see patients 18 years of age and older. °One Wednesday Evening (Monthly: Volunteer Based).  $30 per visit, cash only  °[facility]  (919) 537-3737 for adults; Children under age 4, call Graduate Pediatric Dentistry at (919) [phone]. Children aged 4-14, please call (919) [phone] to request a pediatric application. ° Dental services are provided in all areas of dental care including fillings, crowns and bridges, complete and partial dentures, implants, gum treatment, root canals, and extractions. Preventive care is also provided. Treatment is provided to both adults and children. °Patients are selected via a lottery and there is often a waiting list. °  °[facility] [address], °[place] ° (336) 763-8833 [email or web address] °  °[facility] [address], [place], [place] (336)723-1848, Ext. 123 Second and Fourth Thursday of each month, opens at 6:30 AM; Clinic ends at 9 AM.  Patients are seen on a first-come first-served basis, and a limited number are seen during each clinic.  ° °[facility] ° [address], [place], [place] (336)  723-7904   Eligibility Requirements °You must have lived in [place], [place], or [place] counties for at least the last three months. °  You cannot be eligible for state or federal sponsored healthcare insurance, including Veterans Administration, Medicaid, or Medicare. °  You generally cannot be eligible for healthcare insurance through your employer.  °  How to apply: °Eligibility screenings are held every Tuesday and Wednesday afternoon from 1:00 pm until 4:00 pm. You do not need an appointment for the interview!  °[facility] [address], [place], [place] 336-631-2330   °[facility]  336-342-8273   °[facility]  336-703-3100   °[facility]  336-570-6415   ° °Behavioral Health Resources in the Community: °Intensive Outpatient Programs °Organization         Address  Phone  Notes  °[facility] [address], [place], [place] 336-878-6098   °[facility] [address], [place], [place] 336-832-9800   °[facility] [address], [place], [place] ° 336-882-2125   °[facility] [address],  °[place], [place] 1-800-853-5163 or 336-641-4981   °Substance Abuse Resources °Organization         Address  Phone  Notes  °[facility]  336-882-2125   °[facility]  336-784-9470   °[facility]  336-285-9073   °[facility]  336-845-3988   °Residential & Outpatient Substance Abuse Program  1-800-659-3381   °Psychological Services °Organization         Address  Phone  Notes  °[facility]  336- 832-9600   °[facility]  336- 378-7881   °[facility] [address], [place] 1-800-853-5163 or 336-641-4981   ° °Mobile Crisis Teams °Organization         Address  Phone  Notes  °[facility], Mobile Crisis Care Unit  1-877-626-1772   °Assertive °[facility] ° [address] [place], [place] 336-834-9664   °Sharon DeEsch [address] °[place] 336-554-5454   ° °Self-Help/Support Groups °Organization         Address  Phone             Notes  °[facility] [place] - variety of support groups  336- 373-1402 Call for more information  °Narcotics Anonymous (NA), [facility] [address], °[place]  2 meetings at this location  ° °Residential Treatment Programs °Organization         Address  Phone  Notes  °[facility] [address],    °[place]  1-866-801-8205   °[facility] ° [address], [place], [place] 704-293-8524   °[facility] [address], [place] 336-845-3988 Admissions: 8am-3pm M-F  °[facility] [address]    °[place], [place] 336-841-1104   °[facility] [address], [place], [place] 336-379-7146   °[facility] [address]  °[place], [place] 336-285-9073   °[facility] Intensive Outpatient [address], [place], [place] 336-852-3033   °[facility] [address]  °[place], [place] 1-877-615-2722 or 336-784-9470   °[facility] [address] [place], [place] 336-227-7417 Accepts Medicaid  °[facility] [address]  °[place] 1-800-659-3381 Substance Abuse/Addiction Treatment  ° °[place] Behavioral Health Resources °Organization         Address  Phone  Notes  °[facility]  (888) 581-9988   °Julie Brannon, PhD [address] [place], [place]   (336) 349-5553 or (336) 951-0000   °[facility]   [address] °[place], [place] (336) 349-4454   °[facility] [address], [place], [place] (336) 342-8316 Insurance/Medicaid/sponsorship through [facility]  °[facility] [address]                                      [place], [place] (336) 342-8316  Therapy/tele-psych/case  °[facility] [address]  ° [place], [place] (336) 349-2233    °Dr. Arfeen  (336) 349-4544   °[facility] [place]  [employer] [place] [facility] 1) [address], [place] °2) [address], [place] °3)  [address], [place] (336) 349-3220 °(336) 342-7768 ° °(336) 342-8140   °[place] [facility] (336) 342-1394 or (336) 342-3537 (After Hours)    ° ° ° °

## 2014-08-10 NOTE — ED Notes (Addendum)
Pt presents today with dental pain from a broken tooth in the right, posterior, lower dental arch, which she states had swelling around it and is concerned about infection. Pt states that she is also here today because of continued vaginal bleeding, which has been ongoing for two months. Pt states she was seen by her OB and was given birth control medications to control periods. Pt states that she is not comfortable with taking four different hormones to stop her period. Pt is requesting referral to another OB specialist that may control her bleeding without so many hormones.

## 2014-08-10 NOTE — ED Provider Notes (Signed)
CSN: [id number]     Arrival date & time 08/10/14  1323 History   First MD Initiated Contact with Patient 08/10/14 1328     Chief Complaint  Patient presents with  . Dental Pain  . Vaginal Bleeding     (Consider location/radiation/quality/duration/timing/severity/associated sxs/prior Treatment) HPI Comments: Patient report dental pain for months as well as vaginal bleeding for several months.   Dental pain: Right lower dental pain present for months.  She reports being previously seen and evaluated in ED and was given antibiotics and told to followup with a dentist.  She completed antibiotics which eliminated her swelling, but did not followup with dentist.  She has been taking Tylenol and ibuprofen for pain which has helped.  She did not follow up with dentistry to previous bad experience.  She denies any fevers, chills, facial swelling, difficulty, breathing or swallowing.  Vaginal bleeding: She reports vaginal bleeding for last 2 months.  Currently has Nexplanon in and had been amenorrheic for approximately one year.  She called her gynecologist who recommended he take birth control pills which she has not done due to not wanting "that much hormones."  She has been sexually active without condom use with her husband.  Denies any vaginal discharge, itching, pain.   Patient is a 28 y.o. female presenting with tooth pain and vaginal bleeding. The history is provided by the patient.  Dental Pain Location:  Lower Quality:  Aching and sharp Severity:  Moderate Onset quality:  Gradual Timing:  Intermittent Progression:  Waxing and waning Chronicity:  Recurrent Context: dental caries, dental fracture, poor dentition and trauma   Context: not abscess, not crown fracture, not enamel fracture, filling still in place and not recent dental surgery   Previous work-up:  Dental exam Relieved by:  Acetaminophen and NSAIDs Worsened by:  Cold food/drink and hot food/drink Associated symptoms: no  congestion, no difficulty swallowing, no drooling, no facial pain, no facial swelling, no fever, no gum swelling, no headaches, no oral bleeding and no oral lesions   Risk factors: lack of dental care   Risk factors: no alcohol problem, no cancer, no diabetes and no immunosuppression   Vaginal Bleeding Quality:  Typical of menses Severity:  Moderate Timing:  Constant Progression:  Unchanged Chronicity:  New Menstrual history:  Irregular Possible pregnancy: no   Context: not after intercourse, not after urination, not during intercourse, not during urination, not foreign body and not genital trauma   Relieved by:  Nothing Worsened by:  Nothing tried Ineffective treatments:  Ibuprofen and acetaminophen Associated symptoms: fatigue   Associated symptoms: no abdominal pain, no back pain, no dizziness, no dyspareunia, no dysuria, no fever, no nausea and no vaginal discharge   Risk factors: unprotected sex   Risk factors: no bleeding disorder, does not have multiple partners and no ovarian cysts   Risk factors comment:  PCOS   Past Medical History  Diagnosis Date  . Asthma   . Polycystic ovarian syndrome   . Depression     not current was 10 years ago   Past Surgical History  Procedure Laterality Date  . No past surgeries    . Wisdom tooth extraction     Family History  Problem Relation Age of Onset  . Bipolar disorder Father   . Alcohol abuse Father   . Heart disease Brother   . Dementia Paternal Uncle   . Heart disease Maternal Grandmother     a fib  . Cancer Maternal Grandmother  breast  . Dementia Maternal Grandfather   . Hyperlipidemia Maternal Grandfather   . Diabetes Maternal Grandfather    History  Substance Use Topics  . Smoking status: Current Some Day Smoker -- 0.50 packs/day    Types: Cigarettes  . Smokeless tobacco: Never Used  . Alcohol Use: No   OB History   Grav Para Term Preterm Abortions TAB SAB Ect Mult Living   Review of  Systems  Constitutional: Positive for fatigue. Negative for fever and chills.  HENT: Negative for congestion, drooling, facial swelling and mouth sores.   Respiratory: Negative for chest tightness and shortness of breath.   Cardiovascular: Negative for chest pain and palpitations.  Gastrointestinal: Negative for nausea and abdominal pain.  Genitourinary: Positive for vaginal bleeding and menstrual problem. Negative for dysuria, urgency, frequency, hematuria, flank pain, vaginal discharge, vaginal pain, pelvic pain and dyspareunia.  Musculoskeletal: Negative for back pain.  Skin: Negative for rash.  Neurological: Negative for dizziness, syncope, weakness, light-headedness and headaches.      Allergies  Iodine; Morphine and related; Avelox; and Percocet  Home Medications   Prior to Admission medications   Medication Sig Start Date End Date Taking? Authorizing Provider  acetaminophen (TYLENOL) 500 MG tablet Take 500-1,000 mg by mouth every 6 (six) hours as needed for pain (For headache.).    Historical Provider, MD  amoxicillin (AMOXIL) 500 MG capsule Take 1 capsule (500 mg total) by mouth 3 (three) times daily. 03/30/14   Arthor Captain, PA-C  amoxicillin-clavulanate (AUGMENTIN) 875-125 MG per tablet Take 1 tablet by mouth 2 (two) times daily. 08/10/14   Jamal Collin, MD  LEVORA 0.15/30, 28, 0.15-30 MG-MCG tablet TAKE 3 TABLETS BY MOUTH DAILY FOR 7 DAYS, THEN TAKE 1 TABLET BY MOUTH DAILY FOR 21 DAYS(ACTIVE PILLS ONLY)    Brock Bad, MD   BP 113/67  Pulse 61  Temp(Src) 98.6 F (37 C) (Oral)  Resp 16  SpO2 98% Physical Exam  Vitals reviewed. Constitutional: She is oriented to person, place, and time. She appears well-developed and well-nourished.  HENT:  Mouth/Throat: Mucous membranes are normal. No trismus in the jaw. Abnormal dentition. Dental caries present. No dental abscesses, uvula swelling or lacerations. No posterior oropharyngeal edema, posterior oropharyngeal erythema  or tonsillar abscesses.    Eyes: EOM are normal. Pupils are equal, round, and reactive to light.  Cardiovascular: Normal rate, regular rhythm and normal heart sounds.   Pulmonary/Chest: Effort normal and breath sounds normal.  Abdominal: Soft. Bowel sounds are normal. There is no tenderness.  Musculoskeletal: She exhibits no edema.  Neurological: She is alert and oriented to person, place, and time.  Skin: Skin is warm. No rash noted.    ED Course  Procedures (including critical care time) Labs Review Labs Reviewed  I-STAT CHEM 8, ED - Abnormal; Notable for the following:    BUN 5 (*)    All other components within normal limits  I-STAT CHEM 8, ED    Imaging Review No results found.   EKG Interpretation None      MDM   Final diagnoses:  PCOS (polycystic ovarian syndrome)  Vaginal bleeding, abnormal  Pain due to dental caries   The patient presents with dental pain and prolonged vaginal bleeding.   Dental pain: Previously seen and evaluated in ED for this several months ago, but did not follow up with dentist.  No signs of infection: WBC within normal limits,  afebrile no erythema or facial abscesses.  Patient again, given resources for dental care and prescription for Augmentin, to be used for facial swelling or fever develops, return to the ED for difficulty, breathing or swallowing.  Vaginal bleeding: Patient reports vaginal bleeding x2 months.  Has PCOS and currently has Nexplanon.  She is very discussed this with her gynecologist, but did not want to comply with their recommended treatment; instead, she wanted referral to new gynecologist for second opinion.  Hemoglobin within normal limits.  Patient given resources.  I recommended a followup with [facility] Hospital.     Jamal Collin, MD 08/10/14 [phone]

## 2014-09-17 ENCOUNTER — Encounter (HOSPITAL_COMMUNITY): Payer: Self-pay | Admitting: Emergency Medicine

## 2014-11-12 ENCOUNTER — Encounter: Payer: Self-pay | Admitting: *Deleted

## 2015-01-03 ENCOUNTER — Ambulatory Visit: Payer: Medicaid Other | Admitting: Obstetrics

## 2015-02-25 ENCOUNTER — Telehealth: Payer: Self-pay | Admitting: *Deleted

## 2015-02-25 NOTE — Telephone Encounter (Signed)
Request call 11:27 Busy signal after ringing long time.

## 2015-02-26 ENCOUNTER — Other Ambulatory Visit: Payer: Self-pay | Admitting: Certified Nurse Midwife

## 2015-02-28 ENCOUNTER — Telehealth: Payer: Self-pay | Admitting: *Deleted

## 2015-02-28 NOTE — Telephone Encounter (Signed)
02/27/15:  LM with female to have pt contact office.

## 2015-03-01 ENCOUNTER — Encounter: Payer: Self-pay | Admitting: Certified Nurse Midwife

## 2015-03-01 ENCOUNTER — Ambulatory Visit (INDEPENDENT_AMBULATORY_CARE_PROVIDER_SITE_OTHER): Payer: Medicaid Other | Admitting: Certified Nurse Midwife

## 2015-03-01 VITALS — BP 129/85 | HR 81 | Wt 254.0 lb

## 2015-03-01 DIAGNOSIS — Z0000 Encounter for general adult medical examination without abnormal findings: Secondary | ICD-10-CM | POA: Diagnosis not present

## 2015-03-01 DIAGNOSIS — L989 Disorder of the skin and subcutaneous tissue, unspecified: Secondary | ICD-10-CM

## 2015-03-01 DIAGNOSIS — Z3009 Encounter for other general counseling and advice on contraception: Secondary | ICD-10-CM

## 2015-03-01 DIAGNOSIS — E282 Polycystic ovarian syndrome: Secondary | ICD-10-CM | POA: Diagnosis not present

## 2015-03-01 DIAGNOSIS — N939 Abnormal uterine and vaginal bleeding, unspecified: Secondary | ICD-10-CM | POA: Diagnosis not present

## 2015-03-01 DIAGNOSIS — Z309 Encounter for contraceptive management, unspecified: Secondary | ICD-10-CM

## 2015-03-01 DIAGNOSIS — Z01419 Encounter for gynecological examination (general) (routine) without abnormal findings: Secondary | ICD-10-CM

## 2015-03-01 MED ORDER — NORETHIN ACE-ETH ESTRAD-FE 1.5-30 MG-MCG PO TABS: 1.0000 | ORAL_TABLET | Freq: Every day | ORAL | Status: DC

## 2015-03-01 MED ORDER — NORETHINDRONE-ETH ESTRADIOL 1-35 MG-MCG PO TABS: 1.0000 | ORAL_TABLET | Freq: Every day | ORAL | Status: DC

## 2015-03-01 NOTE — Addendum Note (Signed)
Addended by: Marya LandryFOSTER, SUZANNE D on: 03/01/2015 03:19 PM   Modules accepted: Orders

## 2015-03-01 NOTE — Progress Notes (Signed)
Patient ID: Breanna [name], female   DOB: [birth year], 29 y.o.   MRN: [id number]   Chief Complaint  Patient presents with  . Menstrual Problem    bleeding problem    HPI Breanna [name] is a 29 y.o. female.  C/O bleeding for the last two months.  Currently heavy bleeding while on Nexplanon.  Patient desires to have the bleeding stopped, does not want any more children or periods, has requested a hysterectomy.  Patient educated on options for birth control & period management.  Patient has a hx of PCOS.  Denies any dysmenorrhea.  Is sexually active with spouse.  Has 2 living children.  Also states that she has various skin lesions that she has had for over a year.  At the site of her Nexplanon she has ?bruising/scarring in the shape of teeth marks of unknown cause that does not hurt, itch or grow.  Desires a dermatology refferal.  Has not seen her PCP that is listed on her Medicaid card.  Various contraception management options discussed with patient; patient agrees with plan to remove Nexplanon and try Mirena.  Denies Hirsutism.     HPI  Past Medical History  Diagnosis Date  . Asthma   . Polycystic ovarian syndrome   . Depression     not current was 10 years ago    Past Surgical History  Procedure Laterality Date  . No past surgeries    . Wisdom tooth extraction      Family History  Problem Relation Age of Onset  . Bipolar disorder Father   . Alcohol abuse Father   . Heart disease Brother   . Dementia Paternal Uncle   . Heart disease Maternal Grandmother     a fib  . Cancer Maternal Grandmother     breast  . Dementia Maternal Grandfather   . Hyperlipidemia Maternal Grandfather   . Diabetes Maternal Grandfather     Social History History  Substance Use Topics  . Smoking status: Current Some Day Smoker -- 0.50 packs/day    Types: Cigarettes  . Smokeless tobacco: Never Used  . Alcohol Use: No    Allergies  Allergen Reactions  . Iodine Rash  . Morphine And Related  Nausea And Vomiting  . Avelox [Moxifloxacin Hcl In Nacl] Rash  . Percocet [Oxycodone-Acetaminophen] Hives and Rash    Current Outpatient Prescriptions  Medication Sig Dispense Refill  . acetaminophen (TYLENOL) 500 MG tablet Take 500-1,000 mg by mouth every 6 (six) hours as needed for pain (For headache.).    Marland Kitchen amoxicillin (AMOXIL) 500 MG capsule Take 1 capsule (500 mg total) by mouth 3 (three) times daily. (Patient not taking: Reported on 03/01/2015) 30 capsule 0  . amoxicillin-clavulanate (AUGMENTIN) 875-125 MG per tablet Take 1 tablet by mouth 2 (two) times daily. (Patient not taking: Reported on 03/01/2015) 14 tablet 0  . LEVORA 0.15/30, 28, 0.15-30 MG-MCG tablet TAKE 3 TABLETS BY MOUTH DAILY FOR 7 DAYS, THEN TAKE 1 TABLET BY MOUTH DAILY FOR 21 DAYS(ACTIVE PILLS ONLY) (Patient not taking: Reported on 03/01/2015) 168 tablet 0  . norethindrone-ethinyl estradiol 1/35 (ORTHO-NOVUM 1/35, 28,) tablet Take 1 tablet by mouth daily. 1 Package 2   No current facility-administered medications for this visit.    Review of Systems Review of Systems Constitutional: negative for fatigue and weight loss, has attempted weight loss, stuck at 250lbs with regular exercize Respiratory: negative for cough and wheezing Cardiovascular: negative for chest pain, fatigue and palpitations Gastrointestinal: negative for  abdominal pain and change in bowel habits Genitourinary:+ menorrhagia Integument/breast: negative for nipple discharge Musculoskeletal:negative for myalgias Neurological: negative for gait problems and tremors Behavioral/Psych: negative for abusive relationship, depression Endocrine: negative for temperature intolerance     Blood pressure 129/85, pulse 81, weight 115.214 kg (254 lb), last menstrual period 02/11/2015, currently breastfeeding.  Physical Exam Physical Exam General:   alert  Skin:   no rash or abnormalities  Lungs:   clear to auscultation bilaterally  Heart:   regular rate and  rhythm, S1, S2 normal, no murmur, click, rub or gallop  Breasts:   deferred  Abdomen:  normal findings: no organomegaly, soft, non-tender and no hernia.  Obese.   Pelvis:  External genitalia: normal general appearance Urinary system: urethral meatus normal and bladder without fullness, nontender Vaginal: normal without tenderness, induration or masses Cervix: deferred Adnexa: normal bimanual exam, difficult to assess d/t body habitus Uterus: anteverted and non-tender, enlarged in size    75% of 15 min visit spent on counseling and coordination of care.   Data Reviewed Previous medical hx, labs  Assessment     AUB Various skin lesions PCOS Obesity Contraception management/counseling     Plan    Orders Placed This Encounter  Procedures  . SureSwab, Vaginosis/Vaginitis Plus  . US Transvaginal Non-OB    Standing Status: Future     Number of Occurrences:      Standing Expiration Date: 04/30/2016    Order Specific Question:  Reason for Exam (SYMPTOM  OR DIAGNOSIS REQUIRED)    Answer:  AUB    Order Specific Question:  Preferred imaging location?    Answer:  Internal  . TSH  . Prolactin  . Testosterone, Free, Total, SHBG  . 17-Hydroxyprogesterone  . Progesterone  . Comprehensive metabolic panel  . Ambulatory referral to Internal Medicine    Referral Priority:  Routine    Referral Type:  Consultation    Referral Reason:  Specialty Services Required    Requested Specialty:  Internal Medicine    Number of Visits Requested:  1  . Ambulatory referral to Dermatology    Referral Priority:  Routine    Referral Type:  Consultation    Referral Reason:  Specialty Services Required    Requested Specialty:  Dermatology    Number of Visits Requested:  1   Meds ordered this encounter  Medications  . DISCONTD: norethindrone-ethinyl estradiol-iron (LOESTRIN FE 1.5/30) 1.5-30 MG-MCG tablet    Sig: Take 1 tablet by mouth daily.    Dispense:  1 Package    Refill:  11  .  norethindrone-ethinyl estradiol 1/35 (ORTHO-NOVUM 1/35, 28,) tablet    Sig: Take 1 tablet by mouth daily.    Dispense:  1 Package    Refill:  2     Possible management options include: Nexplanon removal and Mirena insertion.  Metformin depending on lab work.

## 2015-03-02 LAB — COMPREHENSIVE METABOLIC PANEL
ALK PHOS: 94 U/L (ref 39–117)
ALT: 15 U/L (ref 0–35)
AST: 13 U/L (ref 0–37)
Albumin: 3.9 g/dL (ref 3.5–5.2)
BILIRUBIN TOTAL: 0.2 mg/dL (ref 0.2–1.2)
BUN: 7 mg/dL (ref 6–23)
CO2: 20 mEq/L (ref 19–32)
CREATININE: 0.74 mg/dL (ref 0.50–1.10)
Calcium: 9 mg/dL (ref 8.4–10.5)
Chloride: 108 mEq/L (ref 96–112)
GLUCOSE: 92 mg/dL (ref 70–99)
Potassium: 3.9 mEq/L (ref 3.5–5.3)
Sodium: 140 mEq/L (ref 135–145)
Total Protein: 5.7 g/dL — ABNORMAL LOW (ref 6.0–8.3)

## 2015-03-02 LAB — TSH: TSH: 1.5 u[IU]/mL (ref 0.350–4.500)

## 2015-03-02 LAB — HEMOGLOBIN A1C
Hgb A1c MFr Bld: 5.6 % (ref <5.7)
Mean Plasma Glucose: 114 mg/dL (ref <117)

## 2015-03-02 LAB — PROGESTERONE: Progesterone: <0.2 ng/mL

## 2015-03-02 LAB — PROLACTIN: PROLACTIN: 3.7 ng/mL

## 2015-03-04 ENCOUNTER — Telehealth: Payer: Self-pay

## 2015-03-04 LAB — TESTOSTERONE, FREE, TOTAL, SHBG
Sex Hormone Binding: 79 nmol/L (ref 17–124)
TESTOSTERONE FREE: 2 pg/mL (ref 0.6–6.8)
Testosterone-% Free: 1 % (ref 0.4–2.4)
Testosterone: 20 ng/dL (ref 10–70)

## 2015-03-04 NOTE — Telephone Encounter (Signed)
Patient knows of appt dates and times for PCP and dermatolgy appts with [facility]

## 2015-03-05 ENCOUNTER — Other Ambulatory Visit: Payer: Self-pay | Admitting: Certified Nurse Midwife

## 2015-03-05 DIAGNOSIS — B9689 Other specified bacterial agents as the cause of diseases classified elsewhere: Secondary | ICD-10-CM

## 2015-03-05 DIAGNOSIS — N939 Abnormal uterine and vaginal bleeding, unspecified: Secondary | ICD-10-CM

## 2015-03-05 DIAGNOSIS — N760 Acute vaginitis: Principal | ICD-10-CM

## 2015-03-05 LAB — SURESWAB, VAGINOSIS/VAGINITIS PLUS
ATOPOBIUM VAGINAE: 5.9 Log (cells/mL)
BV CATEGORY: UNDETERMINED — AB
C. PARAPSILOSIS, DNA: NOT DETECTED
C. TRACHOMATIS RNA, TMA: NOT DETECTED
C. albicans, DNA: NOT DETECTED
C. glabrata, DNA: NOT DETECTED
C. tropicalis, DNA: NOT DETECTED
Gardnerella vaginalis: 7.9 Log (cells/mL)
LACTOBACILLUS SPECIES: 6.2 Log (cells/mL)
MEGASPHAERA SPECIES: 7.6 Log (cells/mL)
N. gonorrhoeae RNA, TMA: NOT DETECTED
T. VAGINALIS RNA, QL TMA: NOT DETECTED

## 2015-03-05 LAB — 17-HYDROXYPROGESTERONE: 17-OH-PROGESTERONE, LC/MS/MS: 18 ng/dL

## 2015-03-05 MED ORDER — TINIDAZOLE 500 MG PO TABS: 2.0000 g | ORAL_TABLET | Freq: Every day | ORAL | Status: AC

## 2015-03-06 ENCOUNTER — Other Ambulatory Visit: Payer: Self-pay | Admitting: Certified Nurse Midwife

## 2015-03-06 ENCOUNTER — Ambulatory Visit (INDEPENDENT_AMBULATORY_CARE_PROVIDER_SITE_OTHER): Payer: Medicaid Other

## 2015-03-06 ENCOUNTER — Ambulatory Visit: Payer: Medicaid Other | Admitting: Certified Nurse Midwife

## 2015-03-06 DIAGNOSIS — N939 Abnormal uterine and vaginal bleeding, unspecified: Secondary | ICD-10-CM

## 2015-03-06 DIAGNOSIS — N83202 Unspecified ovarian cyst, left side: Secondary | ICD-10-CM

## 2015-03-07 ENCOUNTER — Telehealth: Payer: Self-pay | Admitting: *Deleted

## 2015-03-07 NOTE — Telephone Encounter (Signed)
Left message making pt aware that her Rx has been approved and could pick up at pharmacy.

## 2015-03-14 ENCOUNTER — Telehealth: Payer: Self-pay | Admitting: *Deleted

## 2015-03-14 NOTE — Telephone Encounter (Signed)
Patient interested in a nexplanon removal and a Mirena IUD insertion.  Attempted to contact the patient and recording states "the person you are trying to reach cannot except calls at this time."

## 2015-03-22 ENCOUNTER — Telehealth: Payer: Self-pay | Admitting: *Deleted

## 2015-03-22 NOTE — Telephone Encounter (Signed)
Please tell her to continue the pills twice a day until we can exchange the nexplanon for an IUD.   Thank you.

## 2015-03-22 NOTE — Telephone Encounter (Signed)
Patient states she was given a prescription for birth control pills. Patient wants to clarify how she is supposed to be taking them. Patient states she has will take them until her bleeding stops then she quits taking them and starts bleeding again. Patient currently has a Nexplanon.  Patient has been scheduled for Nexplanon Removal and Mirena Insertion on 04-02-15.

## 2015-03-25 NOTE — Telephone Encounter (Signed)
Left message on patient's personal voicemail regarding recommendations.

## 2015-03-27 ENCOUNTER — Telehealth: Payer: Self-pay | Admitting: *Deleted

## 2015-03-27 DIAGNOSIS — N939 Abnormal uterine and vaginal bleeding, unspecified: Secondary | ICD-10-CM

## 2015-03-27 MED ORDER — NORETHINDRONE-ETH ESTRADIOL 1-35 MG-MCG PO TABS: 1.0000 | ORAL_TABLET | Freq: Every day | ORAL | Status: DC

## 2015-03-27 NOTE — Telephone Encounter (Signed)
Patient needs a refill of the birth control pill Rachelle gave her to get her to her appointment 5/19 when she is changing her birth control. Extension sent to pharmacy. Patient notified.

## 2015-04-02 ENCOUNTER — Ambulatory Visit: Payer: Self-pay | Admitting: Certified Nurse Midwife

## 2015-04-02 ENCOUNTER — Ambulatory Visit: Payer: Self-pay | Admitting: Obstetrics

## 2015-04-10 ENCOUNTER — Ambulatory Visit: Payer: Self-pay | Admitting: Certified Nurse Midwife

## 2015-04-10 ENCOUNTER — Telehealth: Payer: Self-pay | Admitting: *Deleted

## 2015-04-10 NOTE — Telephone Encounter (Signed)
Patient unable to keep appointment for today for Nexplanon Removal and IUD insertion. Patient has been rescheduled for April 17, 2015 @ 10:30 am.

## 2015-04-17 ENCOUNTER — Emergency Department (HOSPITAL_COMMUNITY)
Admission: EM | Admit: 2015-04-17 | Discharge: 2015-04-17 | Disposition: A | Discharge status: Home / Self Care | Payer: Medicaid Other | Attending: Emergency Medicine | Admitting: Emergency Medicine

## 2015-04-17 ENCOUNTER — Encounter: Payer: Self-pay | Admitting: Certified Nurse Midwife

## 2015-04-17 ENCOUNTER — Encounter (HOSPITAL_COMMUNITY): Payer: Self-pay

## 2015-04-17 ENCOUNTER — Ambulatory Visit (INDEPENDENT_AMBULATORY_CARE_PROVIDER_SITE_OTHER): Payer: Medicaid Other | Admitting: Certified Nurse Midwife

## 2015-04-17 VITALS — BP 115/75 | HR 97 | Temp 97.1°F | Ht 68.0 in | Wt 255.0 lb

## 2015-04-17 DIAGNOSIS — Z720 Tobacco use: Secondary | ICD-10-CM

## 2015-04-17 DIAGNOSIS — S41112A Laceration without foreign body of left upper arm, initial encounter: Secondary | ICD-10-CM | POA: Diagnosis not present

## 2015-04-17 DIAGNOSIS — Z8742 Personal history of other diseases of the female genital tract: Secondary | ICD-10-CM | POA: Diagnosis not present

## 2015-04-17 DIAGNOSIS — Z308 Encounter for other contraceptive management: Secondary | ICD-10-CM

## 2015-04-17 DIAGNOSIS — Z716 Tobacco abuse counseling: Secondary | ICD-10-CM

## 2015-04-17 DIAGNOSIS — N939 Abnormal uterine and vaginal bleeding, unspecified: Secondary | ICD-10-CM

## 2015-04-17 DIAGNOSIS — Z30430 Encounter for insertion of intrauterine contraceptive device: Secondary | ICD-10-CM

## 2015-04-17 DIAGNOSIS — IMO0002 Reserved for concepts with insufficient information to code with codable children: Secondary | ICD-10-CM

## 2015-04-17 DIAGNOSIS — Z01812 Encounter for preprocedural laboratory examination: Secondary | ICD-10-CM | POA: Diagnosis not present

## 2015-04-17 DIAGNOSIS — Y999 Unspecified external cause status: Secondary | ICD-10-CM | POA: Insufficient documentation

## 2015-04-17 DIAGNOSIS — X58XXXA Exposure to other specified factors, initial encounter: Secondary | ICD-10-CM | POA: Insufficient documentation

## 2015-04-17 DIAGNOSIS — J45909 Unspecified asthma, uncomplicated: Secondary | ICD-10-CM | POA: Insufficient documentation

## 2015-04-17 DIAGNOSIS — F329 Major depressive disorder, single episode, unspecified: Secondary | ICD-10-CM | POA: Insufficient documentation

## 2015-04-17 DIAGNOSIS — Z4802 Encounter for removal of sutures: Secondary | ICD-10-CM | POA: Diagnosis present

## 2015-04-17 DIAGNOSIS — Y929 Unspecified place or not applicable: Secondary | ICD-10-CM | POA: Diagnosis not present

## 2015-04-17 DIAGNOSIS — Y939 Activity, unspecified: Secondary | ICD-10-CM | POA: Diagnosis not present

## 2015-04-17 DIAGNOSIS — Z3046 Encounter for surveillance of implantable subdermal contraceptive: Secondary | ICD-10-CM

## 2015-04-17 MED ORDER — LIDOCAINE-EPINEPHRINE (PF) 2 %-1:200000 IJ SOLN: 10.0000 mL | Freq: Once | INTRAMUSCULAR | Status: DC

## 2015-04-17 MED ORDER — NORETHINDRONE-ETH ESTRADIOL 1-35 MG-MCG PO TABS: 1.0000 | ORAL_TABLET | Freq: Every day | ORAL | Status: DC

## 2015-04-17 MED ORDER — CEPHALEXIN 500 MG PO CAPS: 500.0000 mg | ORAL_CAPSULE | Freq: Three times a day (TID) | ORAL | Status: DC

## 2015-04-17 MED ORDER — VARENICLINE TARTRATE 0.5 MG X 11 & 1 MG X 42 PO MISC: ORAL | Status: DC

## 2015-04-17 MED ORDER — TRAMADOL HCL 50 MG PO TABS: 50.0000 mg | ORAL_TABLET | Freq: Four times a day (QID) | ORAL | Status: DC | PRN

## 2015-04-17 MED ORDER — IBUPROFEN 800 MG PO TABS: 800.0000 mg | ORAL_TABLET | Freq: Three times a day (TID) | ORAL | Status: DC | PRN

## 2015-04-17 MED ORDER — LIDOCAINE HCL 2 % IJ SOLN
INTRAMUSCULAR | Status: AC
  Filled 2015-04-17: qty 20

## 2015-04-17 MED ORDER — LIDOCAINE-EPINEPHRINE (PF) 2 %-1:200000 IJ SOLN
INTRAMUSCULAR | Status: DC
Start: 2015-04-17 — End: 2015-04-18
  Filled 2015-04-17: qty 20

## 2015-04-17 NOTE — ED Notes (Signed)
Correction.. the implant was removed and the area was stitched, pt doesn't have the implant anymore,

## 2015-04-17 NOTE — Discharge Instructions (Signed)
Please follow up with your primary care physician in 1-2 days. If you do not have one [facility] listed above. Please read all discharge instructions and return precautions.   Laceration Care, Adult A laceration is a cut or lesion that goes through all layers of the skin and into the tissue just beneath the skin. TREATMENT  Some lacerations may not require closure. Some lacerations may not be able to be closed due to an increased risk of infection. It is important to see your caregiver as soon as possible after an injury to minimize the risk of infection and maximize the opportunity for successful closure. If closure is appropriate, pain medicines may be given, if needed. The wound will be cleaned to help prevent infection. Your caregiver will use stitches (sutures), staples, wound glue (adhesive), or skin adhesive strips to repair the laceration. These tools bring the skin edges together to allow for faster healing and a better cosmetic outcome. However, all wounds will heal with a scar. Once the wound has healed, scarring can be minimized by covering the wound with sunscreen during the day for 1 full year. HOME CARE INSTRUCTIONS  For sutures or staples:  Keep the wound clean and dry.  If you were given a bandage (dressing), you should change it at least once a day. Also, change the dressing if it becomes wet or dirty, or as directed by your caregiver.  Wash the wound with soap and water 2 times a day. Rinse the wound off with water to remove all soap. Pat the wound dry with a clean towel.  After cleaning, apply a thin layer of the antibiotic ointment as recommended by your caregiver. This will help prevent infection and keep the dressing from sticking.  You may shower as usual after the first 24 hours. Do not soak the wound in water until the sutures are removed.  Only take over-the-counter or prescription medicines for pain, discomfort, or fever as  directed by your caregiver.  Get your sutures or staples removed as directed by your caregiver. For skin adhesive strips:  Keep the wound clean and dry.  Do not get the skin adhesive strips wet. You may bathe carefully, using caution to keep the wound dry.  If the wound gets wet, pat it dry with a clean towel.  Skin adhesive strips will fall off on their own. You may trim the strips as the wound heals. Do not remove skin adhesive strips that are still stuck to the wound. They will fall off in time. For wound adhesive:  You may briefly wet your wound in the shower or bath. Do not soak or scrub the wound. Do not swim. Avoid periods of heavy perspiration until the skin adhesive has fallen off on its own. After showering or bathing, gently pat the wound dry with a clean towel.  Do not apply liquid medicine, cream medicine, or ointment medicine to your wound while the skin adhesive is in place. This may loosen the film before your wound is healed.  If a dressing is placed over the wound, be careful not to apply tape directly over the skin adhesive. This may cause the adhesive to be pulled off before the wound is healed.  Avoid prolonged exposure to sunlight or tanning lamps while the skin adhesive is in place. Exposure to ultraviolet light in the first year will darken the scar.  The skin adhesive will usually remain in place for 5 to 10 days,  then naturally fall off the skin. Do not pick at the adhesive film. You may need a tetanus shot if:  You cannot remember when you had your last tetanus shot.  You have never had a tetanus shot. If you get a tetanus shot, your arm may swell, get red, and feel warm to the touch. This is common and not a problem. If you need a tetanus shot and you choose not to have one, there is a rare chance of getting tetanus. Sickness from tetanus can be serious. SEEK MEDICAL CARE IF:   You have redness, swelling, or increasing pain in the wound.  You see a red  line that goes away from the wound.  You have yellowish-white fluid (pus) coming from the wound.  You have a fever.  You notice a bad smell coming from the wound or dressing.  Your wound breaks open before or after sutures have been removed.  You notice something coming out of the wound such as wood or glass.  Your wound is on your hand or foot and you cannot move a finger or toe. SEEK IMMEDIATE MEDICAL CARE IF:   Your pain is not controlled with prescribed medicine.  You have severe swelling around the wound causing pain and numbness or a change in color in your arm, hand, leg, or foot.  Your wound splits open and starts bleeding.  You have worsening numbness, weakness, or loss of function of any joint around or beyond the wound.  You develop painful lumps near the wound or on the skin anywhere on your body. MAKE SURE YOU:   Understand these instructions.  Will watch your condition.  Will get help right away if you are not doing well or get worse. Document Released: 11/02/2005 Document Revised: 01/25/2012 Document Reviewed: 04/28/2011 [facility] Patient Information 2015 [employer] [place] [place]. This information is not intended to replace advice given to you by your health care provider. Make sure you discuss any questions you have with your health care provider.

## 2015-04-17 NOTE — Progress Notes (Signed)
Patient ID: Breanna [name], female   DOB: 10/30/1986, 29 y.o.   MRN: [id number]  IUD Procedure Note   DIAGNOSIS: Desires long-term, reversible contraception instead of Nexplanon.  Nexplanon was removed prior to the insertion of the IUD  PROCEDURE: IUD placement Performing Provider: Orvilla Cornwallachelle Delrose Rohwer CNM  Patient counseled prior to procedure. I explained risks and benefits of Mirena IUD, reviewed alternative forms of contraception. Patient stated understanding and consented to continue with procedure.   LMP: 2 weeks ago it stopped, had been bleeding for months with Nexplanon Pregnancy Test: Negative Lot #: [id number] Expiration Date: 10/18   IUD type: [X]  Mirena   [   ] Paraguard    PROCEDURE:  Timeout procedure was performed to ensure right patient and right site.  A bimanual exam was performed to determine the position of the uterus, retroverted. The speculum was placed. The vagina and cervix was sterilized in the usual manner and sterile technique was maintained throughout the course of the procedure. A single toothed tenaculum was not required. The depth of the uterus was sounded to 8.5 cm. The IUD was inserted to the appropriate depth and inserted without difficulty.  The string was cut to an estimated 4 cm length. Bleeding was minimal. The patient tolerated the procedure well.   Follow up: The patient tolerated the procedure well without complications.  Standard post-procedure care is explained and return precautions are given.  F/U in 1 month for string check.    Orvilla Cornwallachelle Kennedee Kitzmiller CNM

## 2015-04-17 NOTE — Progress Notes (Signed)
Patient ID: Breanna MarionMargo E J [name], female   DOB: 12/17/1985, 29 y.o.   MRN: [id number]  NEXPLANON REMOVAL NOTE  Date of LMP:   Unknow, stopped bleeding around May 18th with OCPs.  Was having AUB with Nexplanon and desired to have it removed.  Wanting to try Mirena to stop AUB cycles.    Contraception used: *Nexplanon   Indications:  The patient desires contraception.  She understands risks, benefits, and alternatives to Implanon and would like to proceed.  Anesthesia:   Lidocaine 1% plain.  Procedure:  A time-out was performed confirming the procedure and the patient's allergy status.  Complications: None                      The rod was palpated and the area was sterilely prepped.  The area beneath the distal tip was anesthetized with 1% xylocaine and the skin incised over the tip and the tip                       was exposed, grasped with forcep.  A significant amount of adhesions were noted and removal was difficult.  It was removed intact.  A single suture of 4-0 Vicryl was used to                     close incision. A bandage applied and the arm was wrapped with gauze bandage.  The patient tolerated well.   Instructions:  The patient was instructed to remove the dressing in 24 hours and that some bruising is to be expected.  She was advised to use over the counter analgesics as needed for any pain at the site.  She is to keep the area dry for 24 hours and to call if her hand or arm becomes cold, numb, or blue.  Return visit:  Return in 4 weeks

## 2015-04-17 NOTE — ED Provider Notes (Signed)
CSN: [id number]     Arrival date & time 04/17/15  1921 History   None    This chart was scribed for non-physician practitioner, Francee PiccoloJennifer Jacquelina Hewins PA-C working with Tomasita CrumbleAdeleke Oni, MD by Arlan OrganAshley Leger, ED Scribe. This patient was seen in room WTR9/WTR9 and the patient's care was started at 8:39 PM.   Chief Complaint  Patient presents with  . Suture / Staple Removal   The history is provided by the patient. No language interpreter was used.    HPI Comments: Breanna [name] is a 29 y.o. female without any pertinent past medical history who presents to the Emergency Department here for suture removal this evening. Pt states she had her Nexplanon implant removed today followed by 2-3 suture placements. However, pt states area has since opened. No recent fever or chills. Pt denies any other issues or complaints this time.  Past Medical History  Diagnosis Date  . Asthma   . Polycystic ovarian syndrome   . Depression     not current was 10 years ago   Past Surgical History  Procedure Laterality Date  . No past surgeries    . Wisdom tooth extraction     Family History  Problem Relation Age of Onset  . Bipolar disorder Father   . Alcohol abuse Father   . Heart disease Brother   . Dementia Paternal Uncle   . Heart disease Maternal Grandmother     a fib  . Cancer Maternal Grandmother     breast  . Dementia Maternal Grandfather   . Hyperlipidemia Maternal Grandfather   . Diabetes Maternal Grandfather    History  Substance Use Topics  . Smoking status: Current Some Day Smoker -- 0.50 packs/day    Types: Cigarettes  . Smokeless tobacco: Never Used  . Alcohol Use: No   OB History    Gravida Para Term Preterm AB TAB SAB Ectopic Multiple Living   2 2 2       2      Review of Systems  Constitutional: Negative for fever and chills.  Skin: Positive for wound.  All other systems reviewed and are negative.     Allergies  Iodine; Morphine and related; Avelox; and Percocet  Home  Medications   Prior to Admission medications   Medication Sig Start Date End Date Taking? Authorizing Provider  cephALEXin (KEFLEX) 500 MG capsule Take 1 capsule (500 mg total) by mouth 3 (three) times daily. 04/17/15   Rachelle A Denney, CNM  ibuprofen (ADVIL,MOTRIN) 800 MG tablet Take 1 tablet (800 mg total) by mouth every 8 (eight) hours as needed. 04/17/15   Roe Coombsachelle A Denney, CNM  norethindrone-ethinyl estradiol 1/35 (ORTHO-NOVUM 1/35, 28,) tablet Take 1 tablet by mouth daily. 04/17/15   Rachelle A Denney, CNM  traMADol (ULTRAM) 50 MG tablet Take 1 tablet (50 mg total) by mouth every 6 (six) hours as needed. 04/17/15   Rachelle A Denney, CNM  varenicline (CHANTIX STARTING MONTH PAK) 0.5 MG X 11 & 1 MG X 42 tablet Take one 0.5 mg tablet by mouth once daily for 3 days, then increase to one 0.5 mg tablet twice daily for 4 days, then increase to one 1 mg tablet twice daily. 04/17/15   Roe Coombsachelle A Denney, CNM   Triage Vitals: BP 117/67 mmHg  Pulse 72  Temp(Src) 98.2 F (36.8 C) (Oral)  Resp 18  SpO2 98%  Physical Exam  Constitutional: She is oriented to person, place, and time. She appears well-developed and well-nourished. No  distress.  HENT:  Head: Normocephalic and atraumatic.  Right Ear: External ear normal.  Left Ear: External ear normal.  Nose: Nose normal.  Mouth/Throat: Oropharynx is clear and moist.  Eyes: Conjunctivae are normal.  Neck: Normal range of motion. Neck supple.  No nuchal rigidity.   Cardiovascular: Normal rate, regular rhythm and normal heart sounds.   Pulmonary/Chest: Effort normal and breath sounds normal.  Abdominal: Soft.  Musculoskeletal: Normal range of motion.  Neurological: She is alert and oriented to person, place, and time.  Skin: Skin is warm and dry. She is not diaphoretic.     Psychiatric: She has a normal mood and affect.  Nursing note and vitals reviewed.   ED Course  Procedures (including critical care time) Medications  lidocaine-EPINEPHrine  (XYLOCAINE W/EPI) 2 %-1:200000 (PF) injection 10 mL (not administered)  lidocaine-EPINEPHrine (XYLOCAINE W/EPI) 2 %-1:200000 (PF) injection (not administered)    DIAGNOSTIC STUDIES: Oxygen Saturation is 98% on RA, Normal by my interpretation.    COORDINATION OF CARE: 8:39 PM-Discussed treatment plan with pt at bedside and pt agreed to plan.     Labs Review Labs Reviewed - No data to display  Imaging Review No results found.   EKG Interpretation None      SUTURE REMOVAL Performed by: Francee Piccolo L  Consent: Verbal consent obtained. Patient identity confirmed: provided demographic data Time out: Immediately prior to procedure a "time out" was called to verify the correct patient, procedure, equipment, support staff and site/side marked as required.  Location details: left inner arm  Wound Appearance: clean  Sutures/Staples Removed: 1  Facility: sutures placed in this facility Patient tolerance: Patient tolerated the procedure well with no immediate complications.    LACERATION REPAIR Performed by: Jeannetta Ellis Authorized by: Jeannetta Ellis Consent: Verbal consent obtained. Risks and benefits: risks, benefits and alternatives were discussed Consent given by: patient Patient identity confirmed: provided demographic data Prepped and Draped in normal sterile fashion Wound explored  Laceration Location: left inner arm  Laceration Length: 0.5 cm  No Foreign Bodies seen or palpated  Anesthesia: local infiltration  Local anesthetic: lidocaine 2% w/ epinephrine  Anesthetic total: 5 ml  Irrigation method: syringe Amount of cleaning: standard  Skin closure: 4-0 vicryl rapide  Number of sutures: 1  Technique: simple interrupted  Patient tolerance: Patient tolerated the procedure well with no immediate complications.  MDM   Final diagnoses:  Laceration    Filed Vitals:   04/17/15 1923  BP: 117/67  Pulse: 72  Temp: 98.2  F (36.8 C)  Resp: 18   Afebrile, NAD, non-toxic appearing, AAOx4.   Physical exam is otherwise unremarkable from laceration. Tdap UTD. Wound cleaning complete with pressure irrigation, bottom of wound visualized, no foreign bodies appreciated. Laceration occurred < 8 hours prior to repair which was well tolerated. Pt has no co morbidities to effect normal wound healing. Discussed suture home care w parent/guardian and answered questions. Pt to f-u for recheck in 7 days. Return precautions discussed. Parent agreeable to plan. Pt is hemodynamically stable w no complaints prior to dc.    I personally performed the services described in this documentation, which was scribed in my presence. The recorded information has been reviewed and is accurate.     Francee Piccolo, PA-C 04/17/15 1610  Tomasita Crumble, MD 04/18/15 [phone]

## 2015-04-17 NOTE — ED Notes (Signed)
Pt had a birth control implant put in today and the bandage has fallen off and she thinks some of the stitches are out.

## 2015-04-17 NOTE — ED Notes (Addendum)
Patient stated that she was seen by obstetrician earlier today. Nexplanon was removed suture was place but area has since opened. Left inner upper arm

## 2015-04-20 ENCOUNTER — Encounter (HOSPITAL_COMMUNITY): Payer: Self-pay

## 2015-04-20 ENCOUNTER — Emergency Department (HOSPITAL_COMMUNITY)
Admission: EM | Admit: 2015-04-20 | Discharge: 2015-04-20 | Disposition: A | Discharge status: Home / Self Care | Payer: Medicaid Other | Attending: Emergency Medicine | Admitting: Emergency Medicine

## 2015-04-20 DIAGNOSIS — Z720 Tobacco use: Secondary | ICD-10-CM | POA: Diagnosis not present

## 2015-04-20 DIAGNOSIS — Z8639 Personal history of other endocrine, nutritional and metabolic disease: Secondary | ICD-10-CM | POA: Diagnosis not present

## 2015-04-20 DIAGNOSIS — K047 Periapical abscess without sinus: Secondary | ICD-10-CM

## 2015-04-20 DIAGNOSIS — Z79899 Other long term (current) drug therapy: Secondary | ICD-10-CM | POA: Diagnosis not present

## 2015-04-20 DIAGNOSIS — Z792 Long term (current) use of antibiotics: Secondary | ICD-10-CM | POA: Diagnosis not present

## 2015-04-20 DIAGNOSIS — K088 Other specified disorders of teeth and supporting structures: Secondary | ICD-10-CM | POA: Diagnosis not present

## 2015-04-20 DIAGNOSIS — K0889 Other specified disorders of teeth and supporting structures: Secondary | ICD-10-CM

## 2015-04-20 DIAGNOSIS — Z3202 Encounter for pregnancy test, result negative: Secondary | ICD-10-CM | POA: Insufficient documentation

## 2015-04-20 DIAGNOSIS — R220 Localized swelling, mass and lump, head: Secondary | ICD-10-CM | POA: Diagnosis present

## 2015-04-20 DIAGNOSIS — J45909 Unspecified asthma, uncomplicated: Secondary | ICD-10-CM | POA: Insufficient documentation

## 2015-04-20 DIAGNOSIS — F329 Major depressive disorder, single episode, unspecified: Secondary | ICD-10-CM | POA: Insufficient documentation

## 2015-04-20 LAB — POC URINE PREG, ED: Preg Test, Ur: NEGATIVE

## 2015-04-20 MED ORDER — CLINDAMYCIN HCL 150 MG PO CAPS: 450.0000 mg | ORAL_CAPSULE | Freq: Three times a day (TID) | ORAL | Status: DC

## 2015-04-20 NOTE — ED Provider Notes (Signed)
CSN: [id number]     Arrival date & time 04/20/15  0759 History   First MD Initiated Contact with Patient 04/20/15 [phone]     Chief Complaint  Patient presents with  . Facial Swelling     (Consider location/radiation/quality/duration/timing/severity/associated sxs/prior Treatment) The history is provided by the patient. No language interpreter was used.  Breanna [name] is a 29 year old female with past medical history of asthma, depression, polycystic ovarian syndrome presenting to the ED with facial swelling that started this morning. Patient reported that when she went to bed yesterday evening at approximately midline there is no swelling or discomfort. Patient reports that she feels a heavy sensation, pressure right near the left mandibular region where her swelling is located. Reported that there is a mild tingling sensation. Denied pain anywhere else. Reported that she is taking nothing for the pain. Stated that her Nexplanon was removed 4 days ago, followed by Mirena being placed on the same day. Stated that she has been taking tramadol for pain relief. Reported that she has not seen a dentist in approximately 2-3 years-reported that she does have diagrammed and broken teeth in her lower jawline. Denied fever, chills, neck pain, neck stiffness, chest pain, shortness of breath, difficulty breathing, throat pain, tongue swelling, fainting, blurred vision, sudden loss of vision, dizziness, tooth pain, headache, change his medication, difficulty swallowing. Denied changes to soaps/detergents/facial products/makeup. PCP none  Past Medical History  Diagnosis Date  . Asthma   . Polycystic ovarian syndrome   . Depression     not current was 10 years ago   Past Surgical History  Procedure Laterality Date  . No past surgeries    . Wisdom tooth extraction     Family History  Problem Relation Age of Onset  . Bipolar disorder Father   . Alcohol abuse Father   . Heart disease Brother   . Dementia  Paternal Uncle   . Heart disease Maternal Grandmother     a fib  . Cancer Maternal Grandmother     breast  . Dementia Maternal Grandfather   . Hyperlipidemia Maternal Grandfather   . Diabetes Maternal Grandfather    History  Substance Use Topics  . Smoking status: Current Some Day Smoker -- 0.50 packs/day    Types: Cigarettes  . Smokeless tobacco: Never Used  . Alcohol Use: No   OB History    Gravida Para Term Preterm AB TAB SAB Ectopic Multiple Living   2 2 2       2      Review of Systems  Constitutional: Negative for fever and chills.  HENT: Positive for facial swelling (left mandible). Negative for congestion, ear pain and trouble swallowing.   Eyes: Negative for visual disturbance.  Respiratory: Negative for chest tightness and shortness of breath.   Cardiovascular: Negative for chest pain.  Musculoskeletal: Negative for neck pain and neck stiffness.  Neurological: Negative for dizziness, weakness and headaches.      Allergies  Iodine; Morphine and related; Avelox; and Percocet  Home Medications   Prior to Admission medications   Medication Sig Start Date End Date Taking? Authorizing Provider  cephALEXin (KEFLEX) 500 MG capsule Take 1 capsule (500 mg total) by mouth 3 (three) times daily. 04/17/15   Rachelle A Denney, CNM  ibuprofen (ADVIL,MOTRIN) 800 MG tablet Take 1 tablet (800 mg total) by mouth every 8 (eight) hours as needed. 04/17/15   Roe Coombsachelle A Denney, CNM  norethindrone-ethinyl estradiol 1/35 (ORTHO-NOVUM 1/35, 28,) tablet Take 1 tablet  by mouth daily. 04/17/15   Rachelle A Denney, CNM  traMADol (ULTRAM) 50 MG tablet Take 1 tablet (50 mg total) by mouth every 6 (six) hours as needed. 04/17/15   Rachelle A Denney, CNM  varenicline (CHANTIX STARTING MONTH PAK) 0.5 MG X 11 & 1 MG X 42 tablet Take one 0.5 mg tablet by mouth once daily for 3 days, then increase to one 0.5 mg tablet twice daily for 4 days, then increase to one 1 mg tablet twice daily. 04/17/15   Rachelle A  Denney, CNM   BP 119/66 mmHg  Pulse 76  Temp(Src) 98.7 F (37.1 C) (Oral)  Resp 16  Ht  (1.727 m)  Wt 250 lb (113.399 kg)  BMI 38.02 kg/m2  SpO2 99%  LMP  (Within Weeks) Physical Exam  Constitutional: She is oriented to person, place, and time. She appears well-developed and well-nourished. No distress.  HENT:  Head: Normocephalic and atraumatic.  Mouth/Throat:    Facial swelling identified to left mandibular region with mild discomfort upon palpation. Negative erythema, redness, ecchymosis, changes skin: Noted. Negative warmth upon palpation. Poor dentition identified with numerous dental caries, diagrammed in decaying teeth identified to the mandibular jawline, particularly the left side. Negative active drainage or bleeding noted. Uvula midline with symmetrical elevation. Negative uvula swelling. Negative peritonsillar swelling. Negative trismus. Negative sublingual lesions. Beginnings of periapical abscess identified with areas of induration, no fluctuance identified on examination. Mild discomfort upon palpation. Negative discomfort upon palpation to the submental and submandibular regions.  Eyes: Conjunctivae and EOM are normal. Pupils are equal, round, and reactive to light. Right eye exhibits no discharge. Left eye exhibits no discharge.  Neck: Normal range of motion. Neck supple. No tracheal deviation present.  Negative neck stiffness Negative nuchal rigidity Negative cervical adenopathy Negative meningeal signs  Cardiovascular: Normal rate, regular rhythm and normal heart sounds.   Pulses:      Radial pulses are 2+ on the right side, and 2+ on the left side.  Pulmonary/Chest: Effort normal and breath sounds normal. No respiratory distress. She has no wheezes. She has no rales.  Patient is able to speak in full sentences without difficulty Negative use of accessory muscles Negative stridor  Musculoskeletal: Normal range of motion.  Lymphadenopathy:    She has no  cervical adenopathy.  Neurological: She is alert and oriented to person, place, and time. No cranial nerve deficit. She exhibits normal muscle tone. Coordination normal. GCS eye subscore is 4. GCS verbal subscore is 5. GCS motor subscore is 6.  Skin: Skin is warm and dry. No rash noted. She is not diaphoretic. No erythema.  Psychiatric: She has a normal mood and affect. Her behavior is normal. Thought content normal.  Nursing note and vitals reviewed.   ED Course  Procedures (including critical care time)  Results for orders placed or performed during the hospital encounter of 04/20/15  POC urine preg, ED (not at [facility])  Result Value Ref Range   Preg Test, Ur NEGATIVE NEGATIVE    Labs Review Labs Reviewed  POC URINE PREG, ED    Imaging Review No results found.   EKG Interpretation None      MDM   Final diagnoses:  Pain, dental  Periapical abscess    Medications - No data to display  Filed Vitals:   04/20/15 0811  BP: 119/66  Pulse: 76  Temp: 98.7 F (37.1 C)  TempSrc: Oral  Resp: 16  Height:  (1.727 m)  Weight: 250 lb (  113.399 kg)  SpO2: 99%   Urine pregnancy negative.  Doubt peritonsillar abscess. Doubt retropharyngeal abscess. Doubt Ludwig's angina. Suspicion to be beginnings of periapical abscess secondary to poor dentition. Negative areas of fluctuance identified, all induration-not drainable at this time. Negative respiratory distress noted-airway intact. Patient stable, afebrile. Patient septic appearing. Discharged patient. Discharged patient with antibiotics. Discussed patient to apply warm compressions. [facility] and [facility]. Discussed with patient to closely monitor symptoms and if symptoms are to worsen or change to report back to the ED - strict return instructions given.  Patient agreed to plan of care, understood, all questions answered.   Raymon Mutton, PA-C 04/20/15 1009  Lorre Nick, MD 04/23/15  [phone]

## 2015-04-20 NOTE — ED Notes (Addendum)
Pt states she awoke this am with the LT side of her face swollen.  Denies pain to any of her teeth.  Denies any other symptoms.  She did have a nexplanon implant removed from her LT arm 3 days ago (d/t very long but sporatic periods) and had a mirena placed the same day, but doesn't report anything else.

## 2015-04-20 NOTE — Discharge Instructions (Signed)
Please call your doctor for a followup appointment within 24-48 hours. When you talk to your doctor please let them know that you were seen in the emergency department and have them acquire all of your records so that they can discuss the findings with you and formulate a treatment plan to fully care for your new and ongoing problems. Please call and set-up an appointment with health and wellness Center Please call and set-up an appointment with dentist Please rest and stay hydrated Please take antibiotics as prescribed Please apply warm compressions and massage Please continue to monitor symptoms closely and if symptoms are to worsen or change (fever greater than 101, chills, sweating, nausea, vomiting, chest pain, shortness of breathe, difficulty breathing, weakness, numbness, tingling, worsening or changes to pain pattern, increased facial swelling, bleeding, drainage, throat closing sensation, swelling underneath the tongue, voice changes, facial rash, redness skin blurred vision, red streaks) please report back to the Emergency Department immediately.    Dental Abscess A dental abscess is a collection of infected fluid (pus) from a bacterial infection in the inner part of the tooth (pulp). It usually occurs at the end of the tooth's root.  CAUSES   Severe tooth decay.  Trauma to the tooth that allows bacteria to enter into the pulp, such as a broken or chipped tooth. SYMPTOMS   Severe pain in and around the infected tooth.  Swelling and redness around the abscessed tooth or in the mouth or face.  Tenderness.  Pus drainage.  Bad breath.  Bitter taste in the mouth.  Difficulty swallowing.  Difficulty opening the mouth.  Nausea.  Vomiting.  Chills.  Swollen neck glands. DIAGNOSIS   A medical and dental history will be taken.  An examination will be performed by tapping on the abscessed tooth.  X-rays may be taken of the tooth to identify the abscess. TREATMENT The  goal of treatment is to eliminate the infection. You may be prescribed antibiotic medicine to stop the infection from spreading. A root canal may be performed to save the tooth. If the tooth cannot be saved, it may be pulled (extracted) and the abscess may be drained.  HOME CARE INSTRUCTIONS  Only take over-the-counter or prescription medicines for pain, fever, or discomfort as directed by your caregiver.  Rinse your mouth (gargle) often with salt water ( tsp salt in 8 oz [250 ml] of warm water) to relieve pain or swelling.  Do not drive after taking pain medicine (narcotics).  Do not apply heat to the outside of your face.  Return to your dentist for further treatment as directed. SEEK MEDICAL CARE IF:  Your pain is not helped by medicine.  Your pain is getting worse instead of better. SEEK IMMEDIATE MEDICAL CARE IF:  You have a fever or persistent symptoms for more than 2-3 days.  You have a fever and your symptoms suddenly get worse.  You have chills or a very bad headache.  You have problems breathing or swallowing.  You have trouble opening your mouth.  You have swelling in the neck or around the eye. Document Released: 11/02/2005 Document Revised: 07/27/2012 Document Reviewed: 02/10/2011 [facility] Patient Information 2015 [place], [place]. This information is not intended to replace advice given to you by your health care provider. Make sure you discuss any questions you have with your health care provider.   Emergency Department Resource Guide 1) Find a Doctor and Pay Out of Pocket Although you won't have to find out who is covered by your  insurance plan, it is a good idea to ask around and get recommendations. You will then need to call the office and see if the doctor you have chosen will accept you as a new patient and what types of options they offer for patients who are self-pay. Some doctors offer discounts or will set up payment plans for their patients who do not  have insurance, but you will need to ask so you aren't surprised when you get to your appointment.  2) Contact Your Local Health Department Not all health departments have doctors that can see patients for sick visits, but many do, so it is worth a call to see if yours does. If you don't know where your local health department is, you can check in your phone book. The CDC also has a tool to help you locate your state's health department, and many state websites also have listings of all of their local health departments.  3) Find a Walk-in Clinic If your illness is not likely to be very severe or complicated, you may want to try a walk in clinic. These are popping up all over the country in pharmacies, drugstores, and shopping centers. They're usually staffed by nurse practitioners or physician assistants that have been trained to treat common illnesses and complaints. They're usually fairly quick and inexpensive. However, if you have serious medical issues or chronic medical problems, these are probably not your best option.  No Primary Care Doctor: - Call Health Connect at  [phone] - they can help you locate a primary care doctor that  accepts your insurance, provides certain services, etc. - Physician Referral Service- [phone]  Chronic Pain Problems: Organization         Address  Phone   Notes  [facility]  [phone] Patients need to be referred by their primary care doctor.   Medication Assistance: Organization         Address  Phone   Notes  [facility] [address] [place], [place] [address] [phone] --Must be a resident of [facility] -- Must have NO insurance coverage whatsoever (no Medicaid/ Medicare, etc.) -- The pt. MUST have a primary care doctor that directs their care regularly and follows them in the community   MedAssist  [phone]) [phone]   [employer]  [phone]) [phone]    Agencies that  provide inexpensive medical care: Organization         Address  Phone   Notes  [facility]  [phone]   [facility]    [phone]   [facility] [address] [place], [place] [address] [phone]) [phone]   [facility] [address], [place] [phone]) [phone]   [facility]    [phone]) [phone]   [facility]    [phone]) [phone]   [facility]  [address], [place] Phone:  [phone], Fax:  [phone] Hours of Operation:  9 am - 6 pm, M-F.  Also accepts Medicaid/Medicare and self-pay.  [facility]  [address], [place] Phone: [phone], Fax: [phone]. Hours of Operation:  8:30 am - 5:30 pm, M-F.  Also accepts Medicaid and self-pay.  [facility] [place] [address], [place] Phone: [phone]) [phone]   [facility] [address], [place], [place] [phone], Ext. [phone] Mondays &  Thursdays: 7-9 AM.  First 15 patients are seen on a first come, first serve basis.    Medicaid-accepting [facility] Providers:  Organization         Address  Phone   Notes  [facility] [address], [place] [phone] Also accepts self-pay patients.  [facility] [address] Laurell Josephs [place], [place]  [phone]   [facility] [address], [place] [phone]   [facility] [address], [place] [phone]   Renaye Rakers [address], [place]   [phone] Only accepts [place] Access [place] patients after they have their name applied to their card.   Self-Pay (no insurance) in [facility]:  Organization         Address  Phone   Notes  Sickle Cell Patients, [facility] Internal Medicine [address] [place], [place] [phone]   [facility]  Urgent Care [address] [place], [place] [phone]   [facility] [place]  [address], [place] [phone]   [facility]/Dr. Osei-Bonsu  [address] [place] or [address], [place] [phone] Phone number for both [place] and [place] locations is the same.  [facility] [address], [place] [phone]   [facility] [address], [place] or [address] [phone]   [facility] [address], [place] [phone], phone; [phone], fax Sees patients 1st and 3rd Saturday of every month.  Must not qualify for public or private insurance (i.e. Medicaid, Medicare, [place] Health Choice, Veterans' Benefits)  Household income should be no more than 200% of the poverty level The clinic cannot treat you if you are pregnant or think you are pregnant  Sexually transmitted diseases are not treated at the clinic.    Dental Care: Organization         Address  Phone  Notes  [facility] [address] [place], [place] [phone] Accepts children up to age 65 who are enrolled in [place] or [place] Health Choice; pregnant women with a Medicaid card; and children who have applied for Medicaid or [place] Health Choice, but were declined, whose parents can pay a reduced fee at time of service.  [facility] of [facility]  [address], [place] [phone] Accepts children up to age 5 who are enrolled in [place] or [place] Health Choice; pregnant women with a Medicaid card; and children who have applied for Medicaid or [place] Health Choice, but were declined, whose parents can pay a reduced fee at time of service.  [facility]  [address] [place], [place] [phone] Patients are seen by appointment only. Walk-ins  are not accepted. [facility] will see patients 52 years of age and older. Monday - Tuesday (8am-5pm) Most Wednesdays (8:30-5pm) $30 per visit, cash only  [facility] Access [facility]  [address], [facility] [phone] Patients are seen by appointment only. Walk-ins are not accepted. [facility] will see patients 68 years of age and older. One Wednesday Evening (Monthly: Volunteer Based).  $30 per visit, cash only  [employer]  [phone] for adults; Children under  age 19, call Graduate Pediatric Dentistry at [phone]. Children aged 53-14, please call [phone] to request a pediatric application.  Dental services are provided in all areas of dental care including fillings, crowns and bridges, complete and partial dentures, implants, gum treatment, root canals, and extractions. Preventive care is also provided. Treatment is provided to both adults and children. Patients are selected via a lottery and there is often a waiting list.   [facility] [address], [place]  [phone] [email or web address]   [facility] [address] [place], [place] [phone], Ext. [phone] Second and Fourth Thursday of each month, opens at 6:30 AM; Clinic ends at 9 AM.  Patients are seen on a first-come first-served basis, and a limited number are seen during each clinic.   [facility]  [address] Ether Griffins [place], [place] [phone]   Eligibility Requirements You must have lived in [place], [place], or [place] counties for at least the last three months.   You cannot be eligible for state or federal sponsored [place], including [employer], [place], or [employer].   You generally cannot be eligible for healthcare insurance through your employer.    How to apply: Eligibility screenings are held every Tuesday and Wednesday afternoon from 1:00 pm until 4:00 pm. You do not need an appointment  for the interview!  [facility] [address], [place], [place] [phone]   [facility] Health Department  [phone]   [facility] Health Department  [phone]   [facility] Health Department  [phone]    Behavioral Health Resources in the Community: Intensive Outpatient Programs Organization         Address  Phone  Notes  [facility] [address], [place], [place] [phone]   [facility] Outpatient [address] [place], [place] [phone]   [facility] [address] [place], [place]  [phone]   [facility] Mental Health [address]  [place], [place] [phone] or [phone]   Substance Abuse Resources Organization         Address  Phone  Notes  Al[facility] Drug Services  [phone]   [facility]  [phone]   [facility]  [phone]   Floydene Flock  [phone]   Residential & Outpatient Substance Abuse Program  [phone]   Psychological Services Organization         Address  Phone  Notes  [facility] Behavioral Health  [phone]   [facility] Services  [phone]   [facility] Mental Health [address], [place] [phone] or [phone]    Mobile Crisis Teams Organization         Address  Phone  Notes  [facility], Mobile Crisis Care Unit  [phone]   [facility]  [address] [place], [place] [phone]   Doristine Locks [address] [place] [phone]    Self-Help/Support Groups Organization         Address  Phone             Notes  [facility] [place] - variety of support groups  [phone]- [phone] Call for more information  Narcotics Anonymous (NA), [facility] [address], [employer] [place]  2 meetings at this location   Statistician         Address  Phone  Notes  [facility]  Treatment [phone]  [address],    [place]  [phone]   [facility]  [address], [place] [address], [place], [place] [phone]   [facility] [address] [place], [place] [phone] Admissions: 8am-3pm M-F  [facility] [address]    [place], [place] [phone]   [facility] [address] [place], [place], [place] [phone]   [facility] [address],  [place], [place] [phone]   [facility] - Intensive Outpatient [address], [place], [place] [phone]   [facility] ([facility]iction [facility] [address]  [place], [place] [phone] or [phone]   [facility] ([facility]) [address], [place], [place] [phone] Accepts Medicaid  [facility] [address]  [place] [phone] Substance Abuse/Addiction Treatment   [facility] Organization         Address  Phone  Notes  [facility]  [phone]   Angie Fava, PhD [address] Ervin Knack [place], [place]   [phone] or [phone]   [facility] Behavioral   [address] [place], [place] [phone]   [facility] [address] [place], [place] [phone] Insurance/Medicaid/sponsorship through [facility] and Families [address]                                    [place], [place] [phone] Therapy/tele-psych/case  [facility] [address], [place] [phone]    Dr. Lolly Mustache  [phone]   [facility]  [employer] Comprehensive Outpatient Surge Dept. 1) [address], [place] 2) [address] [place] 3)  [address], [place] [phone]  [phone]   [facility] Child Abuse Hotline (639)627-3662 or [phone] (After Hours)

## 2015-05-12 ENCOUNTER — Emergency Department (HOSPITAL_COMMUNITY)
Admission: EM | Admit: 2015-05-12 | Discharge: 2015-05-13 | Disposition: A | Discharge status: Home / Self Care | Payer: Medicaid Other | Attending: Emergency Medicine | Admitting: Emergency Medicine

## 2015-05-12 ENCOUNTER — Encounter (HOSPITAL_COMMUNITY): Payer: Self-pay | Admitting: Emergency Medicine

## 2015-05-12 DIAGNOSIS — Z79899 Other long term (current) drug therapy: Secondary | ICD-10-CM | POA: Diagnosis not present

## 2015-05-12 DIAGNOSIS — F329 Major depressive disorder, single episode, unspecified: Secondary | ICD-10-CM | POA: Diagnosis not present

## 2015-05-12 DIAGNOSIS — R109 Unspecified abdominal pain: Secondary | ICD-10-CM | POA: Diagnosis present

## 2015-05-12 DIAGNOSIS — Z720 Tobacco use: Secondary | ICD-10-CM | POA: Insufficient documentation

## 2015-05-12 DIAGNOSIS — E282 Polycystic ovarian syndrome: Secondary | ICD-10-CM | POA: Diagnosis not present

## 2015-05-12 DIAGNOSIS — J45909 Unspecified asthma, uncomplicated: Secondary | ICD-10-CM | POA: Insufficient documentation

## 2015-05-12 DIAGNOSIS — R112 Nausea with vomiting, unspecified: Secondary | ICD-10-CM | POA: Insufficient documentation

## 2015-05-12 DIAGNOSIS — Z793 Long term (current) use of hormonal contraceptives: Secondary | ICD-10-CM | POA: Insufficient documentation

## 2015-05-12 NOTE — ED Notes (Signed)
Pt states she had mirena placed 3 weeks ago and today and yesterday she has had abdominal cramping and N/V. Alert and oriented.

## 2015-05-13 LAB — I-STAT CHEM 8, ED
BUN: 7 mg/dL (ref 6–20)
Calcium, Ion: 1.11 mmol/L — ABNORMAL LOW (ref 1.12–1.23)
Chloride: 105 mmol/L (ref 101–111)
Creatinine, Ser: 0.8 mg/dL (ref 0.44–1.00)
Glucose, Bld: 97 mg/dL (ref 65–99)
HCT: 45 % (ref 36.0–46.0)
HEMOGLOBIN: 15.3 g/dL — AB (ref 12.0–15.0)
Potassium: 3.6 mmol/L (ref 3.5–5.1)
SODIUM: 135 mmol/L (ref 135–145)
TCO2: 19 mmol/L (ref 0–100)

## 2015-05-13 MED ORDER — DICYCLOMINE HCL 20 MG PO TABS
20.0000 mg | ORAL_TABLET | Freq: Once | ORAL | Status: AC
  Administered 2015-05-13: 20 mg via ORAL
  Filled 2015-05-13: qty 1

## 2015-05-13 MED ORDER — DICYCLOMINE HCL 20 MG PO TABS
20.0000 mg | ORAL_TABLET | Freq: Four times a day (QID) | ORAL | Status: DC
Start: 2015-05-13 — End: 2017-04-19

## 2015-05-13 NOTE — ED Notes (Signed)
Bed: WA06 Expected date:  Expected time:  Means of arrival:  Comments: 

## 2015-05-13 NOTE — ED Notes (Addendum)
She reports vaginal bleeding and lower abdominal cramping. No recent new partners. Reports vaginal discharge that is brown and sometime yellow. Pt reports countless episodes of vomiting and diarrhea several days ago but none in the 24 hours.

## 2015-05-13 NOTE — ED Provider Notes (Signed)
CSN: [id number]     Arrival date & time 05/12/15  2337 History   First MD Initiated Contact with Patient 05/13/15 0139     Chief Complaint  Patient presents with  . Abdominal Cramping     (Consider location/radiation/quality/duration/timing/severity/associated sxs/prior Treatment) Patient is a 29 y.o. female presenting with cramps. The history is provided by the patient. No language interpreter was used.  Abdominal Cramping This is a new problem. The current episode started in the past 7 days. The problem occurs constantly. The problem has been gradually improving. Associated symptoms include abdominal pain, nausea and vomiting. Pertinent negatives include no chills or fever. Associated symptoms comments: Symptoms started with abdominal cramping, nausea and vomiting 2 days ago. No fever. Symptoms today include persistent abdominal cramping that is intermittent (better now), nausea without vomiting. No diarrhea. She feels she may have eaten something 2 days ago that caused her symptoms. No sick family members. .    Past Medical History  Diagnosis Date  . Asthma   . Polycystic ovarian syndrome   . Depression     not current was 10 years ago   Past Surgical History  Procedure Laterality Date  . No past surgeries    . Wisdom tooth extraction     Family History  Problem Relation Age of Onset  . Bipolar disorder Father   . Alcohol abuse Father   . Heart disease Brother   . Dementia Paternal Uncle   . Heart disease Maternal Grandmother     a fib  . Cancer Maternal Grandmother     breast  . Dementia Maternal Grandfather   . Hyperlipidemia Maternal Grandfather   . Diabetes Maternal Grandfather    History  Substance Use Topics  . Smoking status: Current Some Day Smoker -- 0.50 packs/day    Types: Cigarettes  . Smokeless tobacco: Never Used  . Alcohol Use: No   OB History    Gravida Para Term Preterm AB TAB SAB Ectopic Multiple Living   2 2 2       2      Review of Systems   Constitutional: Negative for fever and chills.  Respiratory: Negative.   Cardiovascular: Negative.   Gastrointestinal: Positive for nausea, vomiting and abdominal pain. Negative for diarrhea.  Genitourinary: Negative.  Negative for dysuria and pelvic pain.       She reports vaginal discharge that is brown c/w finishing her menstrual cycle. No abnormal vaginal symptoms.   Musculoskeletal: Negative.   Skin: Negative.   Neurological: Negative.       Allergies  Iodine; Morphine and related; Avelox; and Percocet  Home Medications   Prior to Admission medications   Medication Sig Start Date End Date Taking? Authorizing Provider  cephALEXin (KEFLEX) 500 MG capsule Take 1 capsule (500 mg total) by mouth 3 (three) times daily. 04/17/15   Rachelle A Denney, CNM  clindamycin (CLEOCIN) 150 MG capsule Take 3 capsules (450 mg total) by mouth 3 (three) times daily. 04/20/15   Marissa Sciacca, PA-C  ibuprofen (ADVIL,MOTRIN) 800 MG tablet Take 1 tablet (800 mg total) by mouth every 8 (eight) hours as needed. 04/17/15   Roe Coombsachelle A Denney, CNM  norethindrone-ethinyl estradiol 1/35 (ORTHO-NOVUM 1/35, 28,) tablet Take 1 tablet by mouth daily. 04/17/15   Rachelle A Denney, CNM  traMADol (ULTRAM) 50 MG tablet Take 1 tablet (50 mg total) by mouth every 6 (six) hours as needed. 04/17/15   Rachelle A Denney, CNM  varenicline (CHANTIX STARTING MONTH PAK) 0.5 MG X 11 &  1 MG X 42 tablet Take one 0.5 mg tablet by mouth once daily for 3 days, then increase to one 0.5 mg tablet twice daily for 4 days, then increase to one 1 mg tablet twice daily. 04/17/15   Rachelle A Denney, CNM   BP 110/65 mmHg  Pulse 61  Temp(Src) 98 F (36.7 C) (Oral)  Resp 16  SpO2 98%  LMP 05/05/2015 Physical Exam  Constitutional: She is oriented to person, place, and time. She appears well-developed and well-nourished.  HENT:  Head: Normocephalic.  Neck: Normal range of motion. Neck supple.  Cardiovascular: Normal rate.   Pulmonary/Chest:  Effort normal.  Abdominal: Soft. Bowel sounds are normal. There is no tenderness. There is no rebound and no guarding.  Musculoskeletal: Normal range of motion.  Neurological: She is alert and oriented to person, place, and time.  Skin: Skin is warm and dry. No rash noted.  Psychiatric: She has a normal mood and affect.    ED Course  Procedures (including critical care time) Labs Review Labs Reviewed  I-STAT CHEM 8, ED   Results for orders placed or performed during the hospital encounter of 05/12/15  I-stat Chem 8, ED  Result Value Ref Range   Sodium 135 135 - 145 mmol/L   Potassium 3.6 3.5 - 5.1 mmol/L   Chloride 105 101 - 111 mmol/L   BUN 7 6 - 20 mg/dL   Creatinine, Ser 1.61 0.44 - 1.00 mg/dL   Glucose, Bld 97 65 - 99 mg/dL   Calcium, Ion 0.96 (L) 1.12 - 1.23 mmol/L   TCO2 19 0 - 100 mmol/L   Hemoglobin 15.3 (H) 12.0 - 15.0 g/dL   HCT 04.5 40.9 - 81.1 %    Imaging Review No results found.   EKG Interpretation None      MDM   Final diagnoses:  None    1. Abdominal cramping  No vomiting. Normal VS and essentially benign exam. The patient is stable for discharge with supportive treatment.     Elpidio Anis, PA-C 05/13/15 9147  Derwood Kaplan, MD 05/14/15 [phone]

## 2015-05-13 NOTE — ED Notes (Signed)
Pt alert, oriented,and ambulatory upon DC. She was advised to follow up with PCP if not better. 

## 2015-05-13 NOTE — ED Notes (Signed)
PA Shari at bedside  

## 2015-05-13 NOTE — Discharge Instructions (Signed)
FOLLOW UP WITH YOUR DOCTOR FOR RECHECK IF SYMPTOMS PERSIST.

## 2015-05-17 ENCOUNTER — Ambulatory Visit: Payer: Medicaid Other | Admitting: Certified Nurse Midwife

## 2015-06-04 ENCOUNTER — Ambulatory Visit: Payer: Medicaid Other | Admitting: Certified Nurse Midwife

## 2016-03-07 ENCOUNTER — Emergency Department (HOSPITAL_COMMUNITY)
Admission: EM | Admit: 2016-03-07 | Discharge: 2016-03-07 | Disposition: A | Discharge status: Home / Self Care | Payer: Medicaid Other | Attending: Emergency Medicine | Admitting: Emergency Medicine

## 2016-03-07 ENCOUNTER — Encounter (HOSPITAL_COMMUNITY): Payer: Self-pay | Admitting: Family Medicine

## 2016-03-07 DIAGNOSIS — K0889 Other specified disorders of teeth and supporting structures: Secondary | ICD-10-CM | POA: Diagnosis present

## 2016-03-07 DIAGNOSIS — J45909 Unspecified asthma, uncomplicated: Secondary | ICD-10-CM | POA: Insufficient documentation

## 2016-03-07 DIAGNOSIS — F329 Major depressive disorder, single episode, unspecified: Secondary | ICD-10-CM | POA: Insufficient documentation

## 2016-03-07 DIAGNOSIS — F17210 Nicotine dependence, cigarettes, uncomplicated: Secondary | ICD-10-CM | POA: Insufficient documentation

## 2016-03-07 DIAGNOSIS — Z79899 Other long term (current) drug therapy: Secondary | ICD-10-CM | POA: Insufficient documentation

## 2016-03-07 MED ORDER — PENICILLIN V POTASSIUM 250 MG PO TABS: 500.0000 mg | ORAL_TABLET | Freq: Four times a day (QID) | ORAL | Status: AC

## 2016-03-07 NOTE — ED Provider Notes (Signed)
CSN: [id number]     Arrival date & time 03/07/16  0903 History  By signing my name below, I, Breanna [name], attest that this documentation has been prepared under the direction and in the presence of [facility] Group[facility] Pilcher Ward, PA-C. Electronically Signed: Placido SouLogan [name], ED Scribe. 03/07/2016. 9:40 AM.   Chief Complaint  Patient presents with  . Dental Pain   The history is provided by the patient. No language interpreter was used.    HPI Comments: Breanna [name] is a 30 y.o. female who presents to the Emergency Department complaining of worsening, moderate, dull, left lower dental pain x 3 days. She reports a broken tooth in the region and associated, moderate, left sided facial swelling. Her pain worsens with palpation of the region. She has been taking bactrim and amoxicillin for 2 days from an old rx without significant relief. She believes she has taken 5 doses of her amoxicillin with her last dosage yesterday. Pt has not been seen by a dental provider for 2-3 years. She reports a hx of smoking and is currently using smoking aids to quit. Pt denies fever, chills, SOB, dysphagia or any other associated symptoms at this time.  Past Medical History  Diagnosis Date  . Asthma   . Polycystic ovarian syndrome   . Depression     not current was 10 years ago   Past Surgical History  Procedure Laterality Date  . No past surgeries    . Wisdom tooth extraction     Family History  Problem Relation Age of Onset  . Bipolar disorder Father   . Alcohol abuse Father   . Heart disease Brother   . Dementia Paternal Uncle   . Heart disease Maternal Grandmother     a fib  . Cancer Maternal Grandmother     breast  . Dementia Maternal Grandfather   . Hyperlipidemia Maternal Grandfather   . Diabetes Maternal Grandfather    Social History  Substance Use Topics  . Smoking status: Current Some Day Smoker -- 0.50 packs/day    Types: Cigarettes  . Smokeless tobacco: Never Used  . Alcohol Use: No   OB  History    Gravida Para Term Preterm AB TAB SAB Ectopic Multiple Living   2 2 2       2      Review of Systems  Constitutional: Negative for fever and chills.  HENT: Positive for dental problem and facial swelling. Negative for trouble swallowing.   Respiratory: Negative for shortness of breath.     Allergies  Iodine; Morphine and related; Avelox; and Percocet  Home Medications   Prior to Admission medications   Medication Sig Start Date End Date Taking? Authorizing Provider  cephALEXin (KEFLEX) 500 MG capsule Take 1 capsule (500 mg total) by mouth 3 (three) times daily. Patient not taking: Reported on 05/13/2015 04/17/15   Rachelle A Denney, CNM  clindamycin (CLEOCIN) 150 MG capsule Take 3 capsules (450 mg total) by mouth 3 (three) times daily. Patient not taking: Reported on 05/13/2015 04/20/15   Marissa Sciacca, PA-C  dicyclomine (BENTYL) 20 MG tablet Take 1 tablet (20 mg total) by mouth every 6 (six) hours. 05/13/15   Elpidio AnisShari Upstill, PA-C  ibuprofen (ADVIL,MOTRIN) 200 MG tablet Take 800 mg by mouth every 6 (six) hours as needed for moderate pain.    Historical Provider, MD  ibuprofen (ADVIL,MOTRIN) 800 MG tablet Take 1 tablet (800 mg total) by mouth every 8 (eight) hours as needed. Patient not taking: Reported on  05/13/2015 04/17/15   Roe Coombs, CNM  levonorgestrel (MIRENA) 20 MCG/24HR IUD 1 each by Intrauterine route continuous.    Historical Provider, MD  norethindrone-ethinyl estradiol 1/35 (ORTHO-NOVUM 1/35, 28,) tablet Take 1 tablet by mouth daily. Patient not taking: Reported on 05/13/2015 04/17/15   Rachelle A Denney, CNM  penicillin v potassium (VEETID) 250 MG tablet Take 2 tablets (500 mg total) by mouth 4 (four) times daily. 03/07/16 03/14/16  Chase Picket Ward, PA-C  traMADol (ULTRAM) 50 MG tablet Take 1 tablet (50 mg total) by mouth every 6 (six) hours as needed. Patient taking differently: Take 50 mg by mouth every 6 (six) hours as needed for moderate pain.  04/17/15   Rachelle  A Denney, CNM  varenicline (CHANTIX STARTING MONTH PAK) 0.5 MG X 11 & 1 MG X 42 tablet Take one 0.5 mg tablet by mouth once daily for 3 days, then increase to one 0.5 mg tablet twice daily for 4 days, then increase to one 1 mg tablet twice daily. Patient not taking: Reported on 05/13/2015 04/17/15   Rachelle A Denney, CNM   BP 121/69 mmHg  Pulse 99  Temp(Src) 98.2 F (36.8 C) (Oral)  Resp 18  SpO2 98%    Physical Exam  Constitutional: She is oriented to person, place, and time. She appears well-developed and well-nourished.  HENT:  Head: Normocephalic and atraumatic.  Mouth/Throat:    Dental cavities and poor oral dentition noted, pain along tooth as depicted in image, midline uvula, no trismus, oropharynx moist and clear, no abscess noted, no oropharyngeal erythema or edema, neck supple and no tenderness. Mild facial swelling on left.   Eyes: EOM are normal.  Neck: Normal range of motion.  Cardiovascular: Normal rate, regular rhythm and normal heart sounds.  Exam reveals no gallop and no friction rub.   No murmur heard. Pulmonary/Chest: Effort normal and breath sounds normal. No respiratory distress. She has no wheezes. She has no rales.  Abdominal: Soft.  Musculoskeletal: Normal range of motion.  Neurological: She is alert and oriented to person, place, and time.  Skin: Skin is warm and dry.  Psychiatric: She has a normal mood and affect.  Nursing note and vitals reviewed.   ED Course  Procedures  DIAGNOSTIC STUDIES: Oxygen Saturation is 98% on RA, normal by my interpretation.    COORDINATION OF CARE: 9:40 AM Discussed next steps with pt. She verbalized understanding and is agreeable with the plan.   Labs Review Labs Reviewed - No data to display  Imaging Review No results found.   EKG Interpretation None      MDM   Final diagnoses:  Dentalgia   Smoking cessation and abx discussed with pt  Patient with dentalgia. No abscess requiring immediate incision and  drainage. Patient is afebrile, non toxic appearing, and swallowing secretions well. Exam not concerning for Ludwig's angina or pharyngeal abscess. Patient has taken 2 days' worth of Amoxil with mild relief. These were left over medications and she dose not have a full course. Will treat with PenVK x 5 days. Patient agrees to call dentist first thing Monday morning. OTC ibuprofen for pain.   [facility] Ward, PA-C 03/07/16 1001  Cathren Laine, MD 03/08/16 [phone]

## 2016-03-07 NOTE — ED Notes (Signed)
Pt here with abscess to left upper dental area. Pt face swollen and painful. sts she has been taking amoxicillin ans bactrim from previous infection

## 2016-03-07 NOTE — ED Notes (Signed)
Declined W/C at D/C and was escorted to lobby by RN. 

## 2016-03-07 NOTE — Discharge Instructions (Signed)
You have a dental injury. It is very important that you get evaluated by a dentist as soon as possible. Call first thing Monday morning to schedule an appointment. Take your full course of antibiotics. Ibuprofen as needed for pain. Read the instructions below.  Eat a soft or liquid diet and rinse your mouth out after meals with warm water. You should see a dentist or return here at once if you have increased swelling, increased pain or uncontrolled bleeding from the site of your injury.  SEEK MEDICAL CARE IF:   You have increased pain not controlled with medicines.   You have swelling around your tooth, in your face or neck.   You have bleeding which starts, continues, or gets worse.   You have a fever >101  If you are unable to open your mouth

## 2016-07-28 ENCOUNTER — Ambulatory Visit: Payer: Medicaid Other | Attending: Podiatry | Admitting: Physical Therapy

## 2016-07-28 DIAGNOSIS — M25572 Pain in left ankle and joints of left foot: Secondary | ICD-10-CM | POA: Insufficient documentation

## 2016-07-28 DIAGNOSIS — R262 Difficulty in walking, not elsewhere classified: Secondary | ICD-10-CM | POA: Diagnosis present

## 2016-08-04 NOTE — Therapy (Deleted)
[facility] Outpatient Rehabilitation [facility] [address] [place], [place], [address] Phone: [phone]   Fax:  [phone]  Physical Therapy Evaluation  Patient Details  Name: Breanna [name] MRN: [id number] Date of Birth: September 30, 1986 Referring Provider: Dr Brandon Melnick   Encounter Date: 07/28/2016    Past Medical History:  Diagnosis Date  . Asthma   . Depression    not current was 10 years ago  . Polycystic ovarian syndrome     Past Surgical History:  Procedure Laterality Date  . NO PAST SURGERIES    . WISDOM TOOTH EXTRACTION      There were no vitals filed for this visit.           [facility] PT Assessment - 08/04/16 0001      Assessment   Medical Diagnosis left ankle instability    Referring Provider Dr Brandon Melnick    Onset Date/Surgical Date --  > 5 years prior    Hand Dominance Right   Next MD Visit 08/06/2016   Prior Therapy No      Precautions   Precautions None     Restrictions   Weight Bearing Restrictions Yes     Balance Screen   Has the patient fallen in the past 6 months No     Home Environment   Living Environment Private residence   Additional Comments Patient lives with 2 children and husband.      Prior Function   Level of Independence Independent     Cognition   Overall Cognitive Status Within Functional Limits for tasks assessed     Observation/Other Assessments   Observations Large callus between the 2nd and third  metetarsels      Observation/Other Assessments-Edema    Edema --  No edema      Sensation   Light Touch Appears Intact     Coordination   Gross Motor Movements are Fluid and Coordinated Yes     Posture/Postural Control   Posture/Postural Control No significant limitations     AROM   Overall AROM Comments AROM WFL      PROM   Overall PROM Comments Hypermobility noted with all active movements      Strength   Left Ankle Dorsiflexion 4+/5   Left Ankle Plantar Flexion 4+/5   Left  Ankle Inversion 4+/5   Left Ankle Eversion 4+/5     Palpation   Palpation comment tenderness to palpation around the ATFL      Special Tests    Special Tests --  single leg stance: diffciulty on the left foot.                    [facility] Adult PT Treatment/Exercise - 08/04/16 0001      Ankle Exercises: Seated   Other Seated Ankle Exercises 4 way ankle t-band 2x10 red, seated heel raise 2x10; seated ankle windshield wiper 2x10      Ankle Exercises: Standing   Other Standing Ankle Exercises cone drill x5, single leg ball toss x10; single leg d2 touch 2x10;  heel raisesx10; mini squats x10; single leg stance 30 seconds with bilateral UE support.                   PT Short Term Goals - 07/29/16 1327      PT SHORT TERM GOAL #1   Title Patient will be independent HEP    Time 1   Period Days   Status Achieved  PT SHORT TERM GOAL #2   Title Patient will verbalize an understanding of activity progression.    Time 1   Period Days   Status Achieved                Patient will benefit from skilled therapeutic intervention in order to improve the following deficits and impairments:  Abnormal gait, Decreased balance, Decreased strength, Pain, Hypermobility  Visit Diagnosis: Pain in left ankle and joints of left foot - Plan: PT plan of care cert/re-cert  Difficulty in walking, not elsewhere classified - Plan: PT plan of care cert/re-cert     Problem List Patient Active Problem List   Diagnosis Date Noted  . PCOS (polycystic ovarian syndrome) 05/10/2013  . MAJOR DEPRESSIVE DISORDER SINGLE EPISODE UNSPEC 10/24/2007  . ASTHMA 10/24/2007    Dessie Comaavid J Noa Constante PT DPT  08/04/2016, 7:42 AM  [facility] [address] [place], [place], [address] Phone: [phone]   Fax:  [phone]  Name: Breanna [name] MRN: [id number] Date of Birth: 06/02/1986

## 2016-08-04 NOTE — Therapy (Signed)
[facility] Outpatient Rehabilitation [facility] [address] [place], [place], [address] Phone: [phone]   Fax:  [phone]  Physical Therapy Evaluation  Patient Details  Name: Breanna [name] MRN: [id number] Date of Birth: June 14, 1986 Referring Provider: Dr Brandon Melnick   Encounter Date: 07/28/2016      PT End of Session - 08/04/16 0746    Visit Number 1   Number of Visits 1   Authorization Type Adult medicaid 1x per restrictions    PT Start Time 1015   PT Stop Time 1100   PT Time Calculation (min) 45 min   Activity Tolerance Patient tolerated treatment well   Behavior During Therapy [facility] for tasks assessed/performed      Past Medical History:  Diagnosis Date  . Asthma   . Depression    not current was 10 years ago  . Polycystic ovarian syndrome     Past Surgical History:  Procedure Laterality Date  . NO PAST SURGERIES    . WISDOM TOOTH EXTRACTION      There were no vitals filed for this visit.       Subjective Assessment - 08/04/16 0743    Subjective Patient sprained her ankle badly when she was in college. Since that point she has continued to have significant ankle instability. She rolls her ankle a few times a week. She also has a very large calus on the vbottom of her foot whcih may be effecting her balance.    Limitations Walking;Standing   How long can you sit comfortably? No limit    How long can you stand comfortably? no limit    How long can you walk comfortably? No limit bbut her ankle rolls at times    Diagnostic tests Nothing taken    Pain Onset More than a month ago            [facility] PT Assessment - 08/04/16 0001      Assessment   Medical Diagnosis left ankle instability    Referring Provider Dr Apolinar Junes Barrett    Onset Date/Surgical Date --  > 5 years prior    Hand Dominance Right   Next MD Visit 08/06/2016   Prior Therapy No      Precautions   Precautions None     Restrictions   Weight Bearing Restrictions  Yes     Balance Screen   Has the patient fallen in the past 6 months No     Home Environment   Living Environment Private residence   Additional Comments Patient lives with 2 children and husband.      Prior Function   Level of Independence Independent     Cognition   Overall Cognitive Status Within Functional Limits for tasks assessed     Observation/Other Assessments   Observations Large callus between the 2nd and third  metetarsels      Observation/Other Assessments-Edema    Edema --  No edema      Sensation   Light Touch Appears Intact     Coordination   Gross Motor Movements are Fluid and Coordinated Yes     Posture/Postural Control   Posture/Postural Control No significant limitations     AROM   Overall AROM Comments AROM WFL      PROM   Overall PROM Comments Hypermobility noted with all active movements      Strength   Left Ankle Dorsiflexion 4+/5   Left Ankle Plantar Flexion 4+/5   Left Ankle Inversion 4+/5  Left Ankle Eversion 4+/5     Palpation   Palpation comment tenderness to palpation around the ATFL      Special Tests    Special Tests --  single leg stance: diffciulty on the left foot.                    [facility] Adult PT Treatment/Exercise - 08/04/16 0001      Ankle Exercises: Seated   Other Seated Ankle Exercises 4 way ankle t-band 2x10 red, seated heel raise 2x10; seated ankle windshield wiper 2x10      Ankle Exercises: Standing   Other Standing Ankle Exercises cone drill x5, single leg ball toss x10; single leg d2 touch 2x10;  heel raisesx10; mini squats x10; single leg stance 30 seconds with bilateral UE support.                   PT Short Term Goals - 08/04/16 0745      PT SHORT TERM GOAL #1   Title Patient will be independent HEP    Time 1   Period Days   Status Achieved     PT SHORT TERM GOAL #2   Title Patient will verbalize an understanding of activity progression.    Time 1   Period Days   Status  Achieved                  Plan - 08/04/16 0744    Clinical Impression Statement Patient is a 30 year old female. She presents with ankle instability. She has frequent falls. She has decreased strength and hypermobility of herankle She would benefit from a stability program. She will only be seen 1x per medicaid restricons. She was given and HEP with progression to higher level balance activity.    Rehab Potential Good   PT Frequency One time visit   PT Treatment/Interventions ADLs/Self Care Home Management;Therapeutic exercise;Patient/family education   PT Next Visit Plan 1x visit    PT Home Exercise Plan 4 way ankle t-band red, seated heel raise 2x10; windshield wiper 2x10; mini sqiuat 2x10; single leg stance 2x10;    Consulted and Agree with Plan of Care Patient      Patient will benefit from skilled therapeutic intervention in order to improve the following deficits and impairments:  Abnormal gait, Decreased balance, Decreased strength, Pain, Hypermobility  Visit Diagnosis: Pain in left ankle and joints of left foot - Plan: PT plan of care cert/re-cert  Difficulty in walking, not elsewhere classified - Plan: PT plan of care cert/re-cert     Problem List Patient Active Problem List   Diagnosis Date Noted  . PCOS (polycystic ovarian syndrome) 05/10/2013  . MAJOR DEPRESSIVE DISORDER SINGLE EPISODE UNSPEC 10/24/2007  . ASTHMA 10/24/2007    Dessie Comaavid J Jaxton Casale PT DPT  08/04/2016, 7:47 AM  [facility] [address] [place], [place], [address] Phone: [phone]   Fax:  [phone]  Name: Breanna [name] MRN: [id number] Date of Birth: 01/01/1986

## 2016-09-12 ENCOUNTER — Emergency Department (HOSPITAL_COMMUNITY)
Admission: EM | Admit: 2016-09-12 | Discharge: 2016-09-12 | Disposition: A | Discharge status: Home / Self Care | Payer: Medicaid Other | Attending: Emergency Medicine | Admitting: Emergency Medicine

## 2016-09-12 ENCOUNTER — Encounter (HOSPITAL_COMMUNITY): Payer: Self-pay | Admitting: Emergency Medicine

## 2016-09-12 DIAGNOSIS — J069 Acute upper respiratory infection, unspecified: Secondary | ICD-10-CM

## 2016-09-12 DIAGNOSIS — F17210 Nicotine dependence, cigarettes, uncomplicated: Secondary | ICD-10-CM | POA: Diagnosis not present

## 2016-09-12 DIAGNOSIS — J45909 Unspecified asthma, uncomplicated: Secondary | ICD-10-CM | POA: Diagnosis not present

## 2016-09-12 DIAGNOSIS — R05 Cough: Secondary | ICD-10-CM | POA: Diagnosis present

## 2016-09-12 MED ORDER — BENZONATATE 100 MG PO CAPS: 100.0000 mg | ORAL_CAPSULE | Freq: Three times a day (TID) | ORAL | 0 refills | Status: DC

## 2016-09-12 MED ORDER — ALBUTEROL SULFATE HFA 108 (90 BASE) MCG/ACT IN AERS: 2.0000 | INHALATION_SPRAY | Freq: Four times a day (QID) | RESPIRATORY_TRACT | 2 refills | Status: DC | PRN

## 2016-09-12 NOTE — ED Provider Notes (Signed)
[facility]-EM[facility]ENCY DEPT Provider Note   CSN: [id number] Arrival date & time: 09/12/16  [id number]     History   Chief Complaint Chief Complaint  Patient presents with  . Cough  . Chest Congestion  . Urinary Tract Infection    HPI Breanna [name] is a 30 y.o. female.  HPI   30 year old female presents today with complaints of upper respiratory infection. Patient reports for 4 days ago she was in [place] with her [place] and lost her voice. She notes shortly after that she continued coughing. She reports very minor rhinorrhea, denies any fevers chills, shortness of breath, abdominal pain. She reports she does have several episodes of posttussive emesis. Patient reports she was seen by primary care yesterday and diagnosed with urinary tract infection and viral upper respiratory infection. She reports that due to chest x-ray with no signs of pneumonia. She was prescribed Bactrim for a symptomatic urinary tract infection, and doxycycline for upper respiratory infection. Patient is a smoker.   Past Medical History:  Diagnosis Date  . Asthma   . Depression    not current was 10 years ago  . Polycystic ovarian syndrome     Patient Active Problem List   Diagnosis Date Noted  . PCOS (polycystic ovarian syndrome) 05/10/2013  . MAJOR DEPRESSIVE DISORDER SINGLE EPISODE UNSPEC 10/24/2007  . ASTHMA 10/24/2007    Past Surgical History:  Procedure Laterality Date  . NO PAST SURGERIES    . WISDOM TOOTH EXTRACTION      OB History    Gravida Para Term Preterm AB Living   2 2 2     2    SAB TAB Ectopic Multiple Live Births           2       Home Medications    Prior to Admission medications   Medication Sig Start Date End Date Taking? Authorizing Provider  albuterol (PROVENTIL HFA;VENTOLIN HFA) 108 (90 Base) MCG/ACT inhaler Inhale 2 puffs into the lungs every 6 (six) hours as needed for wheezing or shortness of breath. 09/12/16   Eyvonne MechanicJeffrey Lindsey Demonte, PA-C  benzonatate (TESSALON) 100 MG capsule  Take 1 capsule (100 mg total) by mouth every 8 (eight) hours. 09/12/16   Eyvonne MechanicJeffrey Mansa Willers, PA-C  cephALEXin (KEFLEX) 500 MG capsule Take 1 capsule (500 mg total) by mouth 3 (three) times daily. Patient not taking: Reported on 05/13/2015 04/17/15   Rachelle A Denney, CNM  clindamycin (CLEOCIN) 150 MG capsule Take 3 capsules (450 mg total) by mouth 3 (three) times daily. Patient not taking: Reported on 05/13/2015 04/20/15   Marissa Sciacca, PA-C  dicyclomine (BENTYL) 20 MG tablet Take 1 tablet (20 mg total) by mouth every 6 (six) hours. Patient not taking: Reported on 07/28/2016 05/13/15   Elpidio AnisShari Upstill, PA-C  ibuprofen (ADVIL,MOTRIN) 200 MG tablet Take 800 mg by mouth every 6 (six) hours as needed for moderate pain.    Historical Provider, MD  ibuprofen (ADVIL,MOTRIN) 800 MG tablet Take 1 tablet (800 mg total) by mouth every 8 (eight) hours as needed. Patient not taking: Reported on 05/13/2015 04/17/15   Roe Coombsachelle A Denney, CNM  levonorgestrel (MIRENA) 20 MCG/24HR IUD 1 each by Intrauterine route continuous.    Historical Provider, MD  norethindrone-ethinyl estradiol 1/35 (ORTHO-NOVUM 1/35, 28,) tablet Take 1 tablet by mouth daily. Patient not taking: Reported on 05/13/2015 04/17/15   Roe Coombsachelle A Denney, CNM  traMADol (ULTRAM) 50 MG tablet Take 1 tablet (50 mg total) by mouth every 6 (six) hours as needed. Patient  not taking: Reported on 07/28/2016 04/17/15   Roe Coombs, CNM  varenicline (CHANTIX STARTING MONTH PAK) 0.5 MG X 11 & 1 MG X 42 tablet Take one 0.5 mg tablet by mouth once daily for 3 days, then increase to one 0.5 mg tablet twice daily for 4 days, then increase to one 1 mg tablet twice daily. Patient not taking: Reported on 05/13/2015 04/17/15   Roe Coombs, CNM    Family History Family History  Problem Relation Age of Onset  . Bipolar disorder Father   . Alcohol abuse Father   . Heart disease Brother   . Dementia Paternal Uncle   . Heart disease Maternal Grandmother     a fib  . Cancer  Maternal Grandmother     breast  . Dementia Maternal Grandfather   . Hyperlipidemia Maternal Grandfather   . Diabetes Maternal Grandfather     Social History Social History  Substance Use Topics  . Smoking status: Current Some Day Smoker    Packs/day: 0.50    Types: Cigarettes  . Smokeless tobacco: Never Used  . Alcohol use No     Allergies   Iodine; Morphine and related; Avelox [moxifloxacin hcl in nacl]; and Percocet [oxycodone-acetaminophen]   Review of Systems Review of Systems  All other systems reviewed and are negative.  Physical Exam Updated Vital Signs BP 131/77 (BP Location: Right Arm)   Pulse 98   Temp 98.4 F (36.9 C) (Oral)   Resp 16   Ht 5\' 8"  (1.727 m)   Wt 107 kg   SpO2 100%   BMI 35.88 kg/m   Physical Exam  Constitutional: She is oriented to person, place, and time. She appears well-developed and well-nourished.  HENT:  Head: Normocephalic and atraumatic.  Nose: Rhinorrhea present.  Eyes: Conjunctivae are normal. Pupils are equal, round, and reactive to light. Right eye exhibits no discharge. Left eye exhibits no discharge. No scleral icterus.  Neck: Normal range of motion. No JVD present. No tracheal deviation present.  Cardiovascular: Regular rhythm.   Pulmonary/Chest: Effort normal. No stridor. No respiratory distress. She has no wheezes. She has no rales. She exhibits no tenderness.  Neurological: She is alert and oriented to person, place, and time. Coordination normal.  Skin: Skin is warm and dry.  Psychiatric: She has a normal mood and affect. Her behavior is normal. Judgment and thought content normal.  Nursing note and vitals reviewed.    ED Treatments / Results  Labs (all labs ordered are listed, but only abnormal results are displayed) Labs Reviewed - No data to display  EKG  EKG Interpretation None       Radiology No results found.  Procedures Procedures (including critical care time)  Medications Ordered in  ED Medications - No data to display   Initial Impression / Assessment and Plan / ED Course  I have reviewed the triage vital signs and the nursing notes.  Pertinent labs & imaging results that were available during my care of the patient were reviewed by me and considered in my medical decision making (see chart for details).  Clinical Course     Final Clinical Impressions(s) / ED Diagnoses   Final diagnoses:  Upper respiratory tract infection, unspecified type    Labs:   Imaging:  Consults:  Therapeutics:  Discharge Meds:   Assessment/Plan:  30 year old female presents today with upper respiratory infection. This is likely viral in nature. She is afebrile and nontoxic, she has clear lung sounds and 100% oxygen  saturation. Patient reports coughing episode this morning. Throughout my exam she was asymptomatic, did not cough once, and showed no signs of shortness of breath. Patient was diagnosed with urinary tract infection yesterday, she has no urinary symptoms. She was also given doxycycline for upper respiratory infection despite negative chest x-ray. I encouraged her to return to primary care and discuss the necessity of antibiotics for both of the above. She reports she will take the doxycycline and follow up with primary care for reevaluation. She is given strict return precautions she verbalized understanding and agreement to today's plan had no further questions or concerns.   New Prescriptions Discharge Medication List as of 09/12/2016  7:45 AM    START taking these medications   Details  benzonatate (TESSALON) 100 MG capsule Take 1 capsule (100 mg total) by mouth every 8 (eight) hours., Starting Sat 09/12/2016, Print         Eyvonne MechanicJeffrey Cherrise Occhipinti, PA-C 09/12/16 [id number]    Zadie Rhineonald Wickline, MD 09/14/16 2117

## 2016-09-12 NOTE — ED Triage Notes (Signed)
Seen [facility]-- dx with bronchitis and UTI -- was given antibiotic and steroid shot, but woke up coughing uncontrollably -- hoarse voice--

## 2016-09-12 NOTE — Discharge Instructions (Signed)
Please read attached information. If you experience any new or worsening signs or symptoms please return to the emergency room for evaluation. Please follow-up with your primary care provider or specialist as discussed. Please use medication prescribed only as directed and discontinue taking if you have any concerning signs or symptoms.   °

## 2017-04-13 ENCOUNTER — Telehealth: Payer: Self-pay

## 2017-04-13 NOTE — Telephone Encounter (Signed)
Pt states that she had an IUD placed about 3 years ago.States that every time she has intercourse she spots for about a day, and this has been going on for about 3 months now. She would like to know if this is normal with having the IUD or if she needs to come in to be evaluated.

## 2017-04-13 NOTE — Telephone Encounter (Signed)
Sometimes some shifting is normal, however if this is only occurring with sexual intercourse and not a period then we need to evaluate for an infection and IUD placement and she will need an appointment.  Thank you.  R.Amoree Newlon CNM

## 2017-04-14 NOTE — Telephone Encounter (Signed)
Pt informed. Pt transferred to front office staff to schedule appt

## 2017-04-16 ENCOUNTER — Inpatient Hospital Stay (HOSPITAL_COMMUNITY)
Admission: AD | Admit: 2017-04-16 | Discharge: 2017-04-16 | Discharge status: Against Medical Advice | Payer: Medicaid Other | Source: Ambulatory Visit | Attending: Obstetrics & Gynecology | Admitting: Obstetrics & Gynecology

## 2017-04-16 DIAGNOSIS — Z5321 Procedure and treatment not carried out due to patient leaving prior to being seen by health care provider: Secondary | ICD-10-CM | POA: Insufficient documentation

## 2017-04-16 DIAGNOSIS — Z8619 Personal history of other infectious and parasitic diseases: Secondary | ICD-10-CM | POA: Diagnosis not present

## 2017-04-16 LAB — URINALYSIS, ROUTINE W REFLEX MICROSCOPIC
Bilirubin Urine: NEGATIVE
GLUCOSE, UA: NEGATIVE mg/dL
Hgb urine dipstick: NEGATIVE
KETONES UR: NEGATIVE mg/dL
NITRITE: NEGATIVE
PROTEIN: NEGATIVE mg/dL
Specific Gravity, Urine: 1.023 (ref 1.005–1.030)
pH: 5 (ref 5.0–8.0)

## 2017-04-16 LAB — POCT PREGNANCY, URINE: PREG TEST UR: NEGATIVE

## 2017-04-16 NOTE — MAU Note (Signed)
For the past few months has been having spotting, not spotting today.  Has hx of BV, has a mirena.  (has had for 3 yrs).  Thinks she has another a infection.

## 2017-04-19 ENCOUNTER — Encounter: Payer: Self-pay | Admitting: Obstetrics

## 2017-04-19 ENCOUNTER — Other Ambulatory Visit (HOSPITAL_COMMUNITY)
Admission: RE | Admit: 2017-04-19 | Discharge: 2017-04-19 | Disposition: A | Discharge status: Home / Self Care | Payer: Medicaid Other | Source: Ambulatory Visit | Attending: Obstetrics | Admitting: Obstetrics

## 2017-04-19 ENCOUNTER — Ambulatory Visit (INDEPENDENT_AMBULATORY_CARE_PROVIDER_SITE_OTHER): Payer: Medicaid Other | Admitting: Obstetrics

## 2017-04-19 ENCOUNTER — Ambulatory Visit: Payer: Medicaid Other | Admitting: Obstetrics

## 2017-04-19 VITALS — BP 140/89 | HR 65 | Wt 241.5 lb

## 2017-04-19 DIAGNOSIS — N939 Abnormal uterine and vaginal bleeding, unspecified: Secondary | ICD-10-CM | POA: Diagnosis not present

## 2017-04-19 DIAGNOSIS — N930 Postcoital and contact bleeding: Secondary | ICD-10-CM | POA: Insufficient documentation

## 2017-04-19 DIAGNOSIS — N921 Excessive and frequent menstruation with irregular cycle: Secondary | ICD-10-CM | POA: Diagnosis not present

## 2017-04-19 DIAGNOSIS — Z975 Presence of (intrauterine) contraceptive device: Secondary | ICD-10-CM

## 2017-04-19 NOTE — Progress Notes (Signed)
Patient ID: Breanna [name], female   DOB: September 11, 1986, 31 y.o.   MRN: [id number]  Chief Complaint  Patient presents with  . Gynecologic Exam    Patient is in the office for IUD check- Patient has bleeding after intercourse for 6 months. Patient has no pain or abnormal discharge.    HPI Breanna [name] is a 31 y.o. female.  Vaginal bleeding after intercourse.  Denies pelvic pain ar abnormal vaginal discharge  HPI  Past Medical History:  Diagnosis Date  . Asthma   . Depression    not current was 10 years ago  . Polycystic ovarian syndrome     Past Surgical History:  Procedure Laterality Date  . NO PAST SURGERIES    . WISDOM TOOTH EXTRACTION      Family History  Problem Relation Age of Onset  . Bipolar disorder Father   . Alcohol abuse Father   . Heart disease Brother   . Dementia Paternal Uncle   . Heart disease Maternal Grandmother        a fib  . Cancer Maternal Grandmother        breast  . Dementia Maternal Grandfather   . Hyperlipidemia Maternal Grandfather   . Diabetes Maternal Grandfather     Social History Social History  Substance Use Topics  . Smoking status: Current Some Day Smoker    Packs/day: 0.50    Types: Cigarettes  . Smokeless tobacco: Never Used  . Alcohol use No    Allergies  Allergen Reactions  . Iodine Rash  . Morphine And Related Nausea And Vomiting  . Avelox [Moxifloxacin Hcl In Nacl] Rash  . Percocet [Oxycodone-Acetaminophen] Hives and Rash    Current Outpatient Prescriptions  Medication Sig Dispense Refill  . levonorgestrel (MIRENA) 20 MCG/24HR IUD 1 each by Intrauterine route continuous.     No current facility-administered medications for this visit.     Review of Systems Review of Systems Constitutional: negative for fatigue and weight loss Respiratory: negative for cough and wheezing Cardiovascular: negative for chest pain, fatigue and palpitations Gastrointestinal: negative for abdominal pain and change in bowel  habits Genitourinary:positive for vaginal bleeding after intercourse Integument/breast: negative for nipple discharge Musculoskeletal:negative for myalgias Neurological: negative for gait problems and tremors Behavioral/Psych: negative for abusive relationship, depression Endocrine: negative for temperature intolerance      Blood pressure 140/89, pulse 65, weight 241 lb 8 oz (109.5 kg).  Physical Exam Physical Exam           General:  Alert and no distress Abdomen:  normal findings: no organomegaly, soft, non-tender and no hernia  Pelvis:  External genitalia: normal general appearance Urinary system: urethral meatus normal and bladder without fullness, nontender Vaginal: normal without tenderness, induration or masses Cervix: normal appearance Adnexa: normal bimanual exam Uterus: anteverted and non-tender, normal size    50% of 15 min visit spent on counseling and coordination of care.    Data Reviewed Labs  Assessment     Vaginal bleeding after intercourse only. IUD Surveillance    Plan    Wet prep and cultures done Ultrasound ordered for IUD position Follow up in 2 weeks  Orders Placed This Encounter  Procedures  . US Transvaginal Non-OB    Standing Status:   Future    Standing Expiration Date:   06/19/2018    Order Specific Question:   Reason for Exam (SYMPTOM  OR DIAGNOSIS REQUIRED)    Answer:   AUB    Order Specific Question:  Preferred imaging location?    Answer:   [facility]  . US Pelvis Complete    Standing Status:   Future    Standing Expiration Date:   06/19/2018    Order Specific Question:   Reason for Exam (SYMPTOM  OR DIAGNOSIS REQUIRED)    Answer:   AUB    Order Specific Question:   Preferred imaging location?    Answer[facility]   No orders of the defined types were placed in this encounter.    Patient ID: Breanna [name], female   DOB: 03/30/1986, 31 y.o.   MRN: [id number]

## 2017-04-20 LAB — CERVICOVAGINAL ANCILLARY ONLY
Bacterial vaginitis: POSITIVE — AB
CANDIDA VAGINITIS: NEGATIVE
CHLAMYDIA, DNA PROBE: NEGATIVE
Neisseria Gonorrhea: NEGATIVE
Trichomonas: NEGATIVE

## 2017-04-21 ENCOUNTER — Other Ambulatory Visit: Payer: Self-pay | Admitting: Obstetrics

## 2017-04-21 DIAGNOSIS — B9689 Other specified bacterial agents as the cause of diseases classified elsewhere: Secondary | ICD-10-CM

## 2017-04-21 DIAGNOSIS — N760 Acute vaginitis: Principal | ICD-10-CM

## 2017-04-21 MED ORDER — METRONIDAZOLE 500 MG PO TABS: 500.0000 mg | ORAL_TABLET | Freq: Two times a day (BID) | ORAL | 2 refills | Status: DC

## 2017-04-26 ENCOUNTER — Encounter (HOSPITAL_COMMUNITY): Payer: Self-pay

## 2017-04-26 ENCOUNTER — Ambulatory Visit (HOSPITAL_COMMUNITY): Admission: RE | Admit: 2017-04-26 | Payer: Medicaid Other | Source: Ambulatory Visit

## 2017-05-03 ENCOUNTER — Ambulatory Visit: Payer: Medicaid Other | Admitting: Obstetrics

## 2017-05-17 ENCOUNTER — Emergency Department (HOSPITAL_COMMUNITY)
Admission: EM | Admit: 2017-05-17 | Discharge: 2017-05-17 | Disposition: A | Discharge status: Home / Self Care | Payer: Medicaid Other | Attending: Emergency Medicine | Admitting: Emergency Medicine

## 2017-05-17 ENCOUNTER — Encounter (HOSPITAL_COMMUNITY): Payer: Self-pay

## 2017-05-17 ENCOUNTER — Emergency Department (HOSPITAL_COMMUNITY): Payer: Medicaid Other

## 2017-05-17 DIAGNOSIS — J45909 Unspecified asthma, uncomplicated: Secondary | ICD-10-CM | POA: Diagnosis not present

## 2017-05-17 DIAGNOSIS — F17210 Nicotine dependence, cigarettes, uncomplicated: Secondary | ICD-10-CM | POA: Insufficient documentation

## 2017-05-17 DIAGNOSIS — M545 Low back pain, unspecified: Secondary | ICD-10-CM

## 2017-05-17 MED ORDER — NAPROXEN 250 MG PO TABS
500.0000 mg | ORAL_TABLET | Freq: Once | ORAL | Status: AC
  Administered 2017-05-17: 500 mg via ORAL
  Filled 2017-05-17: qty 2

## 2017-05-17 MED ORDER — ACETAMINOPHEN 500 MG PO TABS
1000.0000 mg | ORAL_TABLET | Freq: Once | ORAL | Status: DC
  Filled 2017-05-17: qty 2

## 2017-05-17 NOTE — Discharge Instructions (Signed)
Continue to treat your back pain symptomatically. You may take ibuprofen or naproxen (not at the same time, only either or) and supplement with Tylenol. Continue to use warm compresses and take warm baths. Try to rest and avoid bending over and any heavy lifting. Follow-up with neurosurgery for further evaluation. Return to the emergency department if you develops fever, chills, new numbness or tingling, or loss of bowel or bladder control.

## 2017-05-17 NOTE — ED Triage Notes (Signed)
Pt Is coming from home with complaints of sacrum pain that started about a month ago. Pt reports worse with sitting and bending over. Has children who she carries a lot and has to hold with hx of autism.

## 2017-05-17 NOTE — ED Provider Notes (Signed)
[facility] Provider Note   CSN: [id number] Arrival date & time: 05/17/17  1111     History   Chief Complaint Chief Complaint  Patient presents with  . Back Pain    HPI Breanna [name] is a 31 y.o. female presenting with back pain 4 weeks.  Patient states that she has a history of back pain in the past, however 4 weeks ago she woke up from sleep and felt like she had increased back pain. She thought she just slept wrong and it would go away, however it has persisted. Initially her back pain involved her whole back, but now it is just her sacral area. Pain is worse with bending forward, lifting, and extended periods of either walking or sitting. It is relieved when she sits in a warm bath, until she gets up and has to walk. She denies fall, trauma, or injury. She has been taking Tylenol with minimal pain relief. She denies numbness, tingling, loss of bowel or bladder control, or headaches. She is able to ambulate. She reports that she does a lot of lifting of her children who are both autistic and require a lot of attention. She states that combined their 150 pounds, and she lifts them up together frequently.  HPI  Past Medical History:  Diagnosis Date  . Asthma   . Depression    not current was 10 years ago  . Polycystic ovarian syndrome     Patient Active Problem List   Diagnosis Date Noted  . PCOS (polycystic ovarian syndrome) 05/10/2013  . MAJOR DEPRESSIVE DISORDER SINGLE EPISODE UNSPEC 10/24/2007  . ASTHMA 10/24/2007    Past Surgical History:  Procedure Laterality Date  . FOOT SURGERY    . NO PAST SURGERIES    . WISDOM TOOTH EXTRACTION      OB History    Gravida Para Term Preterm AB Living   2 2 2     2    SAB TAB Ectopic Multiple Live Births           2       Home Medications    Prior to Admission medications   Medication Sig Start Date End Date Taking? Authorizing Provider  levonorgestrel (MIRENA) 20 MCG/24HR IUD 1 each by Intrauterine route  continuous.    [provider]  metroNIDAZOLE (FLAGYL) 500 MG tablet Take 1 tablet (500 mg total) by mouth 2 (two) times daily. 04/21/17   Brock BadHarper, Charles A, MD    Family History Family History  Problem Relation Age of Onset  . Bipolar disorder Father   . Alcohol abuse Father   . Heart disease Brother   . Dementia Paternal Uncle   . Heart disease Maternal Grandmother        a fib  . Cancer Maternal Grandmother        breast  . Dementia Maternal Grandfather   . Hyperlipidemia Maternal Grandfather   . Diabetes Maternal Grandfather     Social History Social History  Substance Use Topics  . Smoking status: Current Some Day Smoker    Packs/day: 0.50    Types: Cigarettes  . Smokeless tobacco: Never Used  . Alcohol use No     Allergies   Iodine; Morphine and related; Avelox [moxifloxacin hcl in nacl]; and Percocet [oxycodone-acetaminophen]   Review of Systems Review of Systems  Constitutional: Negative for chills and fever.  Musculoskeletal: Positive for back pain. Negative for neck pain and neck stiffness.  Neurological: Negative for weakness, numbness and headaches.  Physical Exam Updated Vital Signs BP (!) 145/84 (BP Location: Left Arm)   Pulse (!) 58   Temp 97.9 F (36.6 C) (Oral)   Resp 16   Ht 5\' 8"  (1.727 m)   Wt 111.1 kg (245 lb)   SpO2 95%   BMI 37.25 kg/m   Physical Exam  Constitutional: She is oriented to person, place, and time. She appears well-developed and well-nourished. No distress.  HENT:  Head: Normocephalic and atraumatic.  Eyes: Conjunctivae and EOM are normal. Pupils are equal, round, and reactive to light.  Neck: Normal range of motion. Neck supple.  Cardiovascular: Normal rate and regular rhythm.   Pulmonary/Chest: Effort normal and breath sounds normal.  Musculoskeletal:  Minimal tenderness to palpation of the lumbosacral spine. No TTP of the surrounding musculature. Patient with decreased ability to bend forward due to  pain. Patient with full extension of the back and lateral movements without pain. No tenderness palpation the neck. Full range of motion of head.  Pulses in all 4 extremities intact. Sensation in all 470s intact. Strength in all 4 extremities equal and intact.  Neurological: She is alert and oriented to person, place, and time. She has normal strength. No cranial nerve deficit or sensory deficit. She displays a negative Romberg sign. GCS eye subscore is 4. GCS verbal subscore is 5. GCS motor subscore is 6.  Skin: Skin is warm and dry.  Psychiatric: She has a normal mood and affect.  Nursing note and vitals reviewed.    ED Treatments / Results  Labs (all labs ordered are listed, but only abnormal results are displayed) Labs Reviewed - No data to display  EKG  EKG Interpretation None       Radiology Dg Lumbar Spine Complete  Result Date: 05/17/2017 CLINICAL DATA:  Sacral pain for 1 month. Worsening pain with sitting and bending. No reported acute injury. EXAM: LUMBAR SPINE - COMPLETE 4+ VIEW COMPARISON:  Abdominal CT 08/12/2008. FINDINGS: There are 5 lumbar type vertebral bodies. The alignment is normal. The disc spaces are preserved. No evidence of acute fracture or pars defect. Mild sacroiliac degenerative changes are present bilaterally, stable. Intrauterine device noted. IMPRESSION: No acute findings. Electronically Signed   By: Carey Bullocks M.D.   On: 05/17/2017 13:45    Procedures Procedures (including critical care time)  Medications Ordered in ED Medications  naproxen (NAPROSYN) tablet 500 mg (500 mg Oral Given 05/17/17 1410)     Initial Impression / Assessment and Plan / ED Course  I have reviewed the triage vital signs and the nursing notes.  Pertinent labs & imaging results that were available during my care of the patient were reviewed by me and considered in my medical decision making (see chart for details).     Patient's pain has been persisting for 4 weeks  without improvement. Pain is worse with bending forward, lifting, and extended periods of sitting or walking. Will order x-ray for further evaluation. No loss of bowel or bladder control, numbness, tingling, or other neurologic signs. Patient without headache, rash, or neck stiffness. At this time, pain appears to be most likely musculoskeletal.  X-rays negative for fracture, dislocation, or malalignment. Patient is able to ambulate short distances without pain. Will have patient follow-up with neurosurgery for further evaluation of pain. Will provide naproxen 500 for pain control here. Discussed symptomatic care with alternating Tylenol and ibuprofen as well as using warm compresses. Return precautions given. Patient states she understands and agrees to plan.  Final Clinical Impressions(s) /  ED Diagnoses   Final diagnoses:  Acute midline low back pain without sciatica    New Prescriptions Discharge Medication List as of 05/17/2017  2:01 PM       Alveria Apley, PA-C 05/17/17 1416    Linwood Dibbles, MD 05/17/17 1646

## 2017-08-27 ENCOUNTER — Telehealth: Payer: Self-pay

## 2017-08-27 NOTE — Telephone Encounter (Signed)
Pt states that she has had an IUD for about 4 years. She complains of having spotting for about 1 day per month for the past 6 months. She states that this month she has been spotting for about 4 days now and is heavier now. She wants to know if this is normal since she has had the IUD for so long. She went this long without any bleeding up until 6 months ago.

## 2017-08-30 NOTE — Telephone Encounter (Signed)
Needs visit.  Does she feel the strings? Thank you  Breanna [name]

## 2017-09-15 NOTE — Telephone Encounter (Signed)
Pt states that she is doing okay, and does not need to come in. States that she can still fell her strings. Advised pt to cal us if she needs anything

## 2017-10-13 ENCOUNTER — Emergency Department (HOSPITAL_COMMUNITY)
Admission: EM | Admit: 2017-10-13 | Discharge: 2017-10-14 | Disposition: A | Discharge status: Home / Self Care | Payer: Medicaid Other | Attending: Emergency Medicine | Admitting: Emergency Medicine

## 2017-10-13 ENCOUNTER — Other Ambulatory Visit: Payer: Self-pay

## 2017-10-13 ENCOUNTER — Encounter (HOSPITAL_COMMUNITY): Payer: Self-pay | Admitting: *Deleted

## 2017-10-13 ENCOUNTER — Ambulatory Visit (HOSPITAL_COMMUNITY)
Admission: RE | Admit: 2017-10-13 | Discharge: 2017-10-13 | Disposition: A | Discharge status: Home / Self Care | Payer: Medicaid Other | Source: Ambulatory Visit | Attending: Emergency Medicine | Admitting: Emergency Medicine

## 2017-10-13 DIAGNOSIS — Y999 Unspecified external cause status: Secondary | ICD-10-CM | POA: Diagnosis not present

## 2017-10-13 DIAGNOSIS — W230XXA Caught, crushed, jammed, or pinched between moving objects, initial encounter: Secondary | ICD-10-CM | POA: Diagnosis not present

## 2017-10-13 DIAGNOSIS — Y929 Unspecified place or not applicable: Secondary | ICD-10-CM | POA: Insufficient documentation

## 2017-10-13 DIAGNOSIS — S62633A Displaced fracture of distal phalanx of left middle finger, initial encounter for closed fracture: Secondary | ICD-10-CM | POA: Insufficient documentation

## 2017-10-13 DIAGNOSIS — J45909 Unspecified asthma, uncomplicated: Secondary | ICD-10-CM | POA: Insufficient documentation

## 2017-10-13 DIAGNOSIS — F17210 Nicotine dependence, cigarettes, uncomplicated: Secondary | ICD-10-CM | POA: Insufficient documentation

## 2017-10-13 DIAGNOSIS — Y939 Activity, unspecified: Secondary | ICD-10-CM | POA: Diagnosis not present

## 2017-10-13 DIAGNOSIS — Z79899 Other long term (current) drug therapy: Secondary | ICD-10-CM | POA: Insufficient documentation

## 2017-10-13 DIAGNOSIS — S61319A Laceration without foreign body of unspecified finger with damage to nail, initial encounter: Secondary | ICD-10-CM

## 2017-10-13 DIAGNOSIS — S67193A Crushing injury of left middle finger, initial encounter: Secondary | ICD-10-CM | POA: Diagnosis present

## 2017-10-13 DIAGNOSIS — S61313A Laceration without foreign body of left middle finger with damage to nail, initial encounter: Secondary | ICD-10-CM | POA: Diagnosis not present

## 2017-10-13 MED ORDER — TETANUS-DIPHTH-ACELL PERTUSSIS 5-2.5-18.5 LF-MCG/0.5 IM SUSP
0.5000 mL | Freq: Once | INTRAMUSCULAR | Status: AC
  Administered 2017-10-14: 0.5 mL via INTRAMUSCULAR
  Filled 2017-10-13: qty 0.5

## 2017-10-13 MED ORDER — LIDOCAINE HCL (PF) 1 % IJ SOLN
10.0000 mL | Freq: Once | INTRAMUSCULAR | Status: AC
  Administered 2017-10-14: 10 mL via INTRADERMAL
  Filled 2017-10-13: qty 10

## 2017-10-13 NOTE — ED Triage Notes (Signed)
Lt middle finger caught in a house door earlier  She has a detached fingernail in her lt middle finger with controlled bleeding

## 2017-10-14 ENCOUNTER — Emergency Department (HOSPITAL_COMMUNITY)
Admission: EM | Admit: 2017-10-14 | Discharge: 2017-10-14 | Disposition: A | Discharge status: Home / Self Care | Payer: Medicaid Other | Source: Home / Self Care | Attending: Emergency Medicine | Admitting: Emergency Medicine

## 2017-10-14 ENCOUNTER — Encounter (HOSPITAL_COMMUNITY): Payer: Self-pay

## 2017-10-14 DIAGNOSIS — S61319D Laceration without foreign body of unspecified finger with damage to nail, subsequent encounter: Secondary | ICD-10-CM

## 2017-10-14 LAB — POC URINE PREG, ED: Preg Test, Ur: NEGATIVE

## 2017-10-14 MED ORDER — ACETAMINOPHEN 500 MG PO TABS
1000.0000 mg | ORAL_TABLET | Freq: Once | ORAL | Status: DC
Start: 2017-10-14 — End: 2017-10-14
  Filled 2017-10-14: qty 2

## 2017-10-14 MED ORDER — AMOXICILLIN-POT CLAVULANATE 875-125 MG PO TABS: 1.0000 | ORAL_TABLET | Freq: Two times a day (BID) | ORAL | 0 refills | Status: DC

## 2017-10-14 MED ORDER — HYDROCODONE-ACETAMINOPHEN 5-325 MG PO TABS: 1.0000 | ORAL_TABLET | Freq: Four times a day (QID) | ORAL | 0 refills | Status: DC | PRN

## 2017-10-14 MED ORDER — AMOXICILLIN-POT CLAVULANATE 875-125 MG PO TABS
1.0000 | ORAL_TABLET | Freq: Once | ORAL | Status: AC
  Administered 2017-10-14: 1 via ORAL
  Filled 2017-10-14: qty 1

## 2017-10-14 MED ORDER — BUPIVACAINE HCL (PF) 0.5 % IJ SOLN
10.0000 mL | Freq: Once | INTRAMUSCULAR | Status: AC
  Administered 2017-10-14: 10 mL
  Filled 2017-10-14: qty 10

## 2017-10-14 MED ORDER — HYDROCODONE-ACETAMINOPHEN 5-325 MG PO TABS
1.0000 | ORAL_TABLET | Freq: Once | ORAL | Status: AC
  Administered 2017-10-14: 1 via ORAL
  Filled 2017-10-14: qty 1

## 2017-10-14 NOTE — ED Notes (Signed)
Pt reports she just took ibuprofen prior to arriving

## 2017-10-14 NOTE — ED Notes (Signed)
Patient allergic to iodine, soaking hand in saline

## 2017-10-14 NOTE — Progress Notes (Signed)
Orthopedic Tech Progress Note Patient Details:  Breanna [name] 10/07/1986 [id number]  Ortho Devices Type of Ortho Device: Finger splint Ortho Device/Splint Location: lue 3rd finger splint Ortho Device/Splint Interventions: Ordered, Application, Adjustment   Trinna PostMartinez, Seung Nidiffer J 10/14/2017, 2:24 AM

## 2017-10-14 NOTE — ED Provider Notes (Signed)
[facility] Provider Note   CSN: [id number] Arrival date & time: 10/14/17  0547     History   Chief Complaint Chief Complaint  Patient presents with  . Finger Injury    HPI Breanna [name] is a 31 y.o. female.  Patient presents to the emergency department with return of pain after being seen last night for an open tuft fracture of her left middle finger.  Patient states that she went home and tried to sleep.  She did not keep her hand elevated.  Pain returned and became severe prompting emergency department return.  She has not taken anything else for pain.  Finger was sutured and splinted.  No other complaints.  Wound is not bleeding.      Past Medical History:  Diagnosis Date  . Asthma   . Depression    not current was 10 years ago  . Polycystic ovarian syndrome     Patient Active Problem List   Diagnosis Date Noted  . PCOS (polycystic ovarian syndrome) 05/10/2013  . MAJOR DEPRESSIVE DISORDER SINGLE EPISODE UNSPEC 10/24/2007  . ASTHMA 10/24/2007    Past Surgical History:  Procedure Laterality Date  . FOOT SURGERY    . NO PAST SURGERIES    . WISDOM TOOTH EXTRACTION      OB History    Gravida Para Term Preterm AB Living   2 2 2     2    SAB TAB Ectopic Multiple Live Births           2       Home Medications    Prior to Admission medications   Medication Sig Start Date End Date Taking? Authorizing Provider  amoxicillin-clavulanate (AUGMENTIN) 875-125 MG tablet Take 1 tablet by mouth every 12 (twelve) hours. 10/14/17   Breanna CaulLayden, Lindsey A, PA-C  HYDROcodone-acetaminophen (NORCO/VICODIN) 5-325 MG tablet Take 1-2 tablets by mouth every 6 (six) hours as needed. 10/14/17   Breanna CaulLayden, Lindsey A, PA-C  levonorgestrel (MIRENA) 20 MCG/24HR IUD 1 each by Intrauterine route continuous.    [provider]  metroNIDAZOLE (FLAGYL) 500 MG tablet Take 1 tablet (500 mg total) by mouth 2 (two) times daily. 04/21/17   Brock BadHarper, Charles  A, MD    Family History Family History  Problem Relation Age of Onset  . Bipolar disorder Father   . Alcohol abuse Father   . Heart disease Brother   . Dementia Paternal Uncle   . Heart disease Maternal Grandmother        a fib  . Cancer Maternal Grandmother        breast  . Dementia Maternal Grandfather   . Hyperlipidemia Maternal Grandfather   . Diabetes Maternal Grandfather     Social History Social History   Tobacco Use  . Smoking status: Current Some Day Smoker    Packs/day: 0.50    Types: Cigarettes  . Smokeless tobacco: Never Used  Substance Use Topics  . Alcohol use: No    Alcohol/week: 0.0 oz  . Drug use: Yes    Types: Marijuana     Allergies   Iodine; Morphine and related; Avelox [moxifloxacin hcl in nacl]; and Percocet [oxycodone-acetaminophen]   Review of Systems Review of Systems  Musculoskeletal: Positive for arthralgias. Negative for joint swelling.  Skin: Positive for wound.  Neurological: Negative for weakness and numbness.     Physical Exam Updated Vital Signs BP (!) 187/106   Pulse 84   Temp 98.1 F (36.7 C) (Oral)  Resp (!) 22   SpO2 100%   Physical Exam  Constitutional: She appears well-developed and well-nourished.  HENT:  Head: Normocephalic and atraumatic.  Eyes: Pupils are equal, round, and reactive to light.  Neck: Normal range of motion. Neck supple.  Cardiovascular: Normal pulses. Exam reveals no decreased pulses.  Musculoskeletal: She exhibits tenderness. She exhibits no edema.  Patient with nail sutured in place on the left middle digit.  No active bleeding.  Patient complains of pain in her fingertip.  No significant unexpected swelling noted.  Neurological: She is alert. No sensory deficit.  Motor, sensation, and vascular distal to the injury is fully intact.   Skin: Skin is warm and dry.  Psychiatric: She has a normal mood and affect.  Nursing note and vitals reviewed.    ED Treatments / Results  Labs (all  labs ordered are listed, but only abnormal results are displayed) Labs Reviewed - No data to display  EKG  EKG Interpretation None       Radiology Dg Finger Middle Left  Result Date: 10/13/2017 CLINICAL DATA:  Trauma to the left middle finger. EXAM: LEFT MIDDLE FINGER 2+V COMPARISON:  None. FINDINGS: There is an oblique fracture through the tip of the distal left third phalanx. Associated soft tissue swelling. Minimal palmar dislocation of the distal fracture fragment. Soft tissue swelling. IMPRESSION: Oblique minimally displaced fracture of the tip of the distal third left phalanx. Electronically Signed   By: Ted Mcalpineobrinka  Dimitrova M.D.   On: 10/13/2017 22:03    Procedures .Nerve Block Date/Time: 10/14/2017 6:59 AM Performed by: Renne CriglerGeiple, Marisah Laker, PA-C Authorized by: Renne CriglerGeiple, Lj Miyamoto, PA-C   Consent:    Consent obtained:  Verbal   Consent given by:  Patient   Risks discussed:  Pain, unsuccessful block and infection   Alternatives discussed:  No treatment Indications:    Indications:  Pain relief Location:    Laterality:  Left Pre-procedure details:    Skin preparation:  Alcohol Procedure details (see MAR for exact dosages):    Block needle gauge:  27 G   Anesthetic injected:  Bupivacaine 0.5% w/o epi   Injection procedure:  Anatomic landmarks identified, incremental injection, introduced needle, negative aspiration for blood and anatomic landmarks palpated   Paresthesia:  Immediately resolved Post-procedure details:    Dressing:  None   Outcome:  Pain improved   Patient tolerance of procedure:  Tolerated well, no immediate complications   (including critical care time)  Medications Ordered in ED Medications  bupivacaine (MARCAINE) 0.5 % injection 10 mL (not administered)     Initial Impression / Assessment and Plan / ED Course  I have reviewed the triage vital signs and the nursing notes.  Pertinent labs & imaging results that were available during my care of the  patient were reviewed by me and considered in my medical decision making (see chart for details).     Patient seen and examined. Reviewed note from last night. Medications ordered.   Vital signs reviewed and are as follows: BP (!) 187/106   Pulse 84   Temp 98.1 F (36.7 C) (Oral)   Resp (!) 22   SpO2 100%   Ring block performed with good results.  Patient instructed to follow-up as previously described.  She has Augmentin and Vicodin for home.  Discussed Rice protocol.  Final Clinical Impressions(s) / ED Diagnoses   Final diagnoses:  Laceration of nail bed of finger, subsequent encounter   Patient with open nailbed laceration as described previously.  Patient here  for uncontrolled pain.  She has pain medication for home.  Ring block performed here for pain relief.  ED Discharge Orders    None       Renne Crigler, Cordelia Poche 10/14/17 0701    Glynn Octave, MD 10/14/17 [phone]

## 2017-10-14 NOTE — ED Notes (Signed)
Pt A&Ox4, ambulatory at d/c with independent steady gait, NAD 

## 2017-10-14 NOTE — ED Provider Notes (Signed)
[facility]   CSN: [id number] Arrival date & time: 10/13/17  1949     History   Chief Complaint Chief Complaint  Patient presents with  . Finger Injury    HPI Breanna [name] is [name] 31 y.o. female who presents with left middle finger injury.  Patient reports that this evening, she was walking in her house when she tripped on her child, causing her to fall and catch her hand in the doorway.  Patient reports that the door was then closed on her finger.  Patient reports pain and swelling to the distal end of the left finger since the incident.  Patient has not taking any medications for the pain.  Patient states that pain is worse with movement of the finger.  Patient denies any numbness/weakness.  Patient does not know when her last tetanus shot was.  The history is provided by the patient.    Past Medical History:  Diagnosis Date  . Asthma   . Depression    not current was 10 years ago  . Polycystic ovarian syndrome     Patient Active Problem List   Diagnosis Date Noted  . PCOS (polycystic ovarian syndrome) 05/10/2013  . MAJOR DEPRESSIVE DISORDER SINGLE EPISODE UNSPEC 10/24/2007  . ASTHMA 10/24/2007    Past Surgical History:  Procedure Laterality Date  . FOOT SURGERY    . NO PAST SURGERIES    . WISDOM TOOTH EXTRACTION      OB History    Gravida Para Term Preterm AB Living   2 2 2     2    SAB TAB Ectopic Multiple Live Births           2       Home Medications    Prior to Admission medications   Medication Sig Start Date End Date Taking? Authorizing Provider  amoxicillin-clavulanate (AUGMENTIN) 875-125 MG tablet Take 1 tablet by mouth every 12 (twelve) hours. 10/14/17   Breanna CaulLayden, Aaralyn Kil A, PA-C  HYDROcodone-acetaminophen (NORCO/VICODIN) 5-325 MG tablet Take 1-2 tablets by mouth every 6 (six) hours as needed. 10/14/17   Breanna CaulLayden, Yuriel Lopezmartinez A, PA-C  levonorgestrel (MIRENA) 20 MCG/24HR IUD 1 each by Intrauterine route  continuous.    [provider]  metroNIDAZOLE (FLAGYL) 500 MG tablet Take 1 tablet (500 mg total) by mouth 2 (two) times daily. 04/21/17   Brock BadHarper, Charles A, MD    Family History Family History  Problem Relation Age of Onset  . Bipolar disorder Father   . Alcohol abuse Father   . Heart disease Brother   . Dementia Paternal Uncle   . Heart disease Maternal Grandmother        [name] fib  . Cancer Maternal Grandmother        breast  . Dementia Maternal Grandfather   . Hyperlipidemia Maternal Grandfather   . Diabetes Maternal Grandfather     Social History Social History   Tobacco Use  . Smoking status: Current Some Day Smoker    Packs/day: 0.50    Types: Cigarettes  . Smokeless tobacco: Never Used  Substance Use Topics  . Alcohol use: No    Alcohol/week: 0.0 oz  . Drug use: Yes    Types: Marijuana     Allergies   Iodine; Morphine and related; Avelox [moxifloxacin hcl in nacl]; and Percocet [oxycodone-acetaminophen]   Review of Systems Review of Systems  Skin: Positive for wound.  Neurological: Negative for weakness and numbness.  Physical Exam Updated Vital Signs BP (!) 148/90   Pulse 60   Temp 98.4 F (36.9 C) (Oral)   Resp 16   Ht 5\' 8"  (1.727 m)   Wt 113.4 kg (250 lb)   SpO2 98%   BMI 38.01 kg/m   Physical Exam  Constitutional: She appears well-developed and well-nourished.  Sitting comfortably on examination table  HENT:  Head: Normocephalic and atraumatic.  Eyes: Conjunctivae and EOM are normal. Right eye exhibits no discharge. Left eye exhibits no discharge. No scleral icterus.  Cardiovascular:  Pulses:      Radial pulses are 2+ on the right side, and 2+ on the left side.  Pulmonary/Chest: Effort normal.  Musculoskeletal:  Tenderness to palpation noted to the distal aspect of the left third digit.  There is some overlying soft tissue swelling. No tenderness to palpation of the base of the left digit.  No tenderness palpation to the wrist.   Full range of motion of the wrist without any difficulty.  Neurological: She is alert.  Skin: Skin is warm and dry. Capillary refill takes less than 2 seconds.  Good distal cap refill.  The distal end of the nail on the left third digit is slightly avulsed from the nail bed.  Nail is still intact to the proximal nail fold.  Psychiatric: She has [name] normal mood and affect. Her speech is normal and behavior is normal.  Nursing note and vitals reviewed.      ED Treatments / Results  Labs (all labs ordered are listed, but only abnormal results are displayed) Labs Reviewed  POC URINE PREG, ED    EKG  EKG Interpretation None       Radiology Dg Finger Middle Left  Result Date: 10/13/2017 CLINICAL DATA:  Trauma to the left middle finger. EXAM: LEFT MIDDLE FINGER 2+V COMPARISON:  None. FINDINGS: There is an oblique fracture through the tip of the distal left third phalanx. Associated soft tissue swelling. Minimal palmar dislocation of the distal fracture fragment. Soft tissue swelling. IMPRESSION: Oblique minimally displaced fracture of the tip of the distal third left phalanx. Electronically Signed   By: Ted Mcalpineobrinka  Dimitrova M.D.   On: 10/13/2017 22:03    Procedures .Nail Removal Date/Time: 10/14/2017 1:56 AM Performed by: Breanna CaulLayden, Brendyn Mclaren A, PA-C Authorized by: Breanna CaulLayden, Dalexa Gentz A, PA-C   Consent:    Consent obtained:  Verbal   Consent given by:  Patient   Risks discussed:  Incomplete removal, bleeding and infection Location:    Hand:  L long finger Pre-procedure details:    Skin preparation:  Alcohol and ChloraPrep Anesthesia (see MAR for exact dosages):    Anesthesia method:  Nerve block   Block needle gauge:  27 G   Block anesthetic:  Lidocaine 1% w/o epi   Block injection procedure:  Anatomic landmarks identified, incremental injection, anatomic landmarks palpated and negative aspiration for blood   Block outcome:  Anesthesia achieved Nail Removal:    Nail removed:   Complete   Nail bed repaired: yes     Nail bed repair material:  5-0 vicryl   Number of sutures:  5   Removed nail replaced and anchored: yes   Post-procedure details:    Dressing:  Non-adhesive packing strip, antibiotic ointment and splint   Patient tolerance of procedure:  Tolerated well, no immediate complications Comments:     Once the digital block was performed and anesthesia was achieved, the nail was explored through full range of motion.  The wound was thoroughly  and completely irrigated.  There was concern about continued bleeding after irrigation.  Given the extent of continued bleeding, the nail was removed.  There was [name] portion of the distal nailbed that was completely removed.  There is no evidence of open fracture or foreign bodies identified.  The remaining portion of the nail bed was sewn together using Vicryl stitches.  The nail was thoroughly and extensively irrigated and cleaned and put back in place and anchored with Prolene stitch sutures.   (including critical care time)       Medications Ordered in ED Medications  acetaminophen (TYLENOL) tablet 1,000 mg ( Oral Not Given 10/14/17 0047)  Tdap (BOOSTRIX) injection 0.5 mL (0.5 mLs Intramuscular Given 10/14/17 0047)  lidocaine (PF) (XYLOCAINE) 1 % injection 10 mL (10 mLs Intradermal Given by Other 10/14/17 0100)  amoxicillin-clavulanate (AUGMENTIN) 875-125 MG per tablet 1 tablet (1 tablet Oral Given 10/14/17 0202)  HYDROcodone-acetaminophen (NORCO/VICODIN) 5-325 MG per tablet 1 tablet (1 tablet Oral Given 10/14/17 0202)     Initial Impression / Assessment and Plan / ED Course  I have reviewed the triage vital signs and the nursing notes.  Pertinent labs & imaging results that were available during my care of the patient were reviewed by me and considered in my medical decision making (see chart for details).     31 year old female who presents with left third digit injury that occurred this evening.  Patient got  the finger caught in [name] door and the door was slammed on her finger.  Patient does not know when her last tetanus shot was. Patient is afebrile, non-toxic appearing, sitting comfortably on examination table. Vital signs reviewed and stable. Patient is neurovascularly intact.  Exam shows tenderness palpation of the distal aspect of the left third digit.  There is obvious distal and nail avulsion.  The proximal nail is secured in the nail bed fold.  Consider fracture versus dislocation.  Also consider nailbed injury.  X-rays ordered at triage.  X-rays reviewed. There is an oblique minimally displaced fracture of the tip of the distal third left phalanx.  Discussed results with patient.  We will plan to irrigate the wound completely and explore for evidence of nailbed laceration.  Laceration repaired as documented above.  When the wound was thoroughly irrigated, it was found to show continued bleeding.  The nail was removed to show [name] portion of the distal nailbed was completely removed.  The wound was thoroughly irrigated.  No evidence of open fracture.  The nail bed was repaired.  The nail was sutured back in place with Prolene. . Patient provided splint for support and stabilization.  Will plan to start patient on antibiotic therapy.  Patient only allergic to Avelox.  Initially we will plan to give pain medication for symptomatic relief.  Patient reports that she is allergic to Percocet but she has taken hydrocodone without any difficulties.  Instructed patient to follow-up with preferred hand doctor for further evaluation. Patient had ample opportunity for questions and discussion. All patient's questions were answered with full understanding. Strict return precautions discussed. Patient expresses understanding and agreement to plan.   Final Clinical Impressions(s) / ED Diagnoses   Final diagnoses:  Closed displaced fracture of distal phalanx of left middle finger, initial encounter  Laceration of finger  nail bed, initial encounter    ED Discharge Orders        Ordered    amoxicillin-clavulanate (AUGMENTIN) 875-125 MG tablet  Every 12 hours     10/14/17 0151  HYDROcodone-acetaminophen (NORCO/VICODIN) 5-325 MG tablet  Every 6 hours PRN     10/14/17 0151       Breanna Caul, PA-C 10/14/17 1610    Glynn Octave, MD 10/14/17 Jeralyn Bennett

## 2017-10-14 NOTE — Discharge Instructions (Signed)
Please read and follow all provided instructions.  Your diagnoses today include:  1. Laceration of nail bed of finger, subsequent encounter     Tests performed today include:  Vital signs. See below for your results today.   Medications prescribed:   None  Take any prescribed medications only as directed.  Home care instructions:   Follow any educational materials contained in this packet  Follow [facility].I.[facility]. Protocol:  R - rest your injury   I  - use ice on injury without applying directly to skin  C - compress injury with bandage or splint  E - elevate the injury as much as possible  Follow-up instructions: Follow instructions given to you at your previous visit.  Take medications as prescribed.  Return instructions:   Please return if your fingers are numb or tingling, appear gray or blue, or you have severe pain (also elevate the arm and loosen splint or wrap if you were given one)  Please return to the Emergency Department if you experience worsening symptoms.   Please return if you have any other emergent concerns.  Additional Information:  Your vital signs today were: BP (!) 187/106    Pulse 84    Temp 98.1 F (36.7 C) (Oral)    Resp (!) 22    SpO2 100%  If your blood pressure (BP) was elevated above 135/85 this visit, please have this repeated by your doctor within one month. --------------

## 2017-10-14 NOTE — Discharge Instructions (Addendum)
You can take Tylenol or Ibuprofen as directed for pain. You can alternate Tylenol and Ibuprofen every 4 hours. If you take Tylenol at 1pm, then you can take Ibuprofen at 5pm. Then you can take Tylenol again at 9pm.   Take antibiotics as directed. Please take all of your antibiotics until finished.  Wear the splint for support and stabilization.   Follow-up with the referred hand doctor for further evaluation.  Return drainage from the finger or any other worsening or concerning symptoms.

## 2017-10-14 NOTE — ED Triage Notes (Signed)
Pt reports pain to left middle finger nailbed after getting sutures to it after she got her finger caught in a house door last night. Pt was seen last night and received sutures to place her fingernail back on. Pt crying in pain stating the lidocaine has worn off. She took norco last at 2:15 am per pt.

## 2017-10-22 ENCOUNTER — Encounter: Payer: Self-pay | Admitting: Obstetrics

## 2018-02-21 ENCOUNTER — Other Ambulatory Visit (HOSPITAL_COMMUNITY): Payer: Self-pay | Admitting: Gastroenterology

## 2018-02-21 DIAGNOSIS — R7989 Other specified abnormal findings of blood chemistry: Secondary | ICD-10-CM

## 2018-02-21 DIAGNOSIS — R945 Abnormal results of liver function studies: Secondary | ICD-10-CM

## 2018-03-02 ENCOUNTER — Other Ambulatory Visit: Payer: Self-pay | Admitting: Radiology

## 2018-03-03 ENCOUNTER — Ambulatory Visit (HOSPITAL_COMMUNITY)
Admission: RE | Admit: 2018-03-03 | Discharge: 2018-03-03 | Disposition: A | Discharge status: Home / Self Care | Payer: Medicaid Other | Source: Ambulatory Visit | Attending: Gastroenterology | Admitting: Gastroenterology

## 2018-03-03 ENCOUNTER — Encounter (HOSPITAL_COMMUNITY): Payer: Self-pay

## 2018-03-03 DIAGNOSIS — R945 Abnormal results of liver function studies: Secondary | ICD-10-CM | POA: Insufficient documentation

## 2018-03-03 DIAGNOSIS — R7989 Other specified abnormal findings of blood chemistry: Secondary | ICD-10-CM

## 2018-03-03 LAB — CBC
HEMATOCRIT: 43.7 % (ref 36.0–46.0)
HEMOGLOBIN: 14.9 g/dL (ref 12.0–15.0)
MCH: 30.7 pg (ref 26.0–34.0)
MCHC: 34.1 g/dL (ref 30.0–36.0)
MCV: 90.1 fL (ref 78.0–100.0)
Platelets: 249 10*3/uL (ref 150–400)
RBC: 4.85 MIL/uL (ref 3.87–5.11)
RDW: 12.8 % (ref 11.5–15.5)
WBC: 9.5 10*3/uL (ref 4.0–10.5)

## 2018-03-03 LAB — PROTIME-INR
INR: 0.97
PROTHROMBIN TIME: 12.8 s (ref 11.4–15.2)

## 2018-03-03 LAB — APTT: aPTT: 37 seconds — ABNORMAL HIGH (ref 24–36)

## 2018-03-03 MED ORDER — HYDROMORPHONE HCL 2 MG/ML IJ SOLN
1.0000 mg | Freq: Once | INTRAMUSCULAR | Status: AC
Start: 2018-03-03 — End: 2018-03-03
  Administered 2018-03-03: 1 mg via INTRAVENOUS

## 2018-03-03 MED ORDER — HYDROMORPHONE HCL 2 MG/ML IJ SOLN
INTRAMUSCULAR | Status: AC
  Administered 2018-03-03: 1 mg via INTRAVENOUS
  Filled 2018-03-03: qty 1

## 2018-03-03 MED ORDER — DIPHENHYDRAMINE HCL 50 MG/ML IJ SOLN
INTRAMUSCULAR | Status: AC
  Filled 2018-03-03: qty 1

## 2018-03-03 MED ORDER — ONDANSETRON HCL 4 MG/2ML IJ SOLN
4.0000 mg | Freq: Once | INTRAMUSCULAR | Status: AC
  Administered 2018-03-03: 4 mg via INTRAVENOUS
  Filled 2018-03-03: qty 2

## 2018-03-03 MED ORDER — HYDROMORPHONE HCL 2 MG/ML IJ SOLN
INTRAMUSCULAR | Status: AC
  Filled 2018-03-03: qty 1

## 2018-03-03 MED ORDER — SODIUM CHLORIDE 0.9 % IV SOLN: INTRAVENOUS | Status: DC

## 2018-03-03 MED ORDER — FENTANYL CITRATE (PF) 100 MCG/2ML IJ SOLN
INTRAMUSCULAR | Status: AC | PRN
  Administered 2018-03-03 (×2): 50 ug via INTRAVENOUS

## 2018-03-03 MED ORDER — GELATIN ABSORBABLE 12-7 MM EX MISC
CUTANEOUS | Status: AC
  Filled 2018-03-03: qty 1

## 2018-03-03 MED ORDER — LIDOCAINE HCL (PF) 1 % IJ SOLN
INTRAMUSCULAR | Status: AC
  Filled 2018-03-03: qty 30

## 2018-03-03 MED ORDER — ONDANSETRON HCL 4 MG/2ML IJ SOLN
INTRAMUSCULAR | Status: AC
  Administered 2018-03-03: 4 mg via INTRAVENOUS
  Filled 2018-03-03: qty 2

## 2018-03-03 MED ORDER — DIPHENHYDRAMINE HCL 25 MG PO CAPS
25.0000 mg | ORAL_CAPSULE | Freq: Once | ORAL | Status: AC
  Administered 2018-03-03: 25 mg via ORAL
  Filled 2018-03-03: qty 1

## 2018-03-03 MED ORDER — FENTANYL CITRATE (PF) 100 MCG/2ML IJ SOLN
INTRAMUSCULAR | Status: AC
  Filled 2018-03-03: qty 2

## 2018-03-03 MED ORDER — MIDAZOLAM HCL 2 MG/2ML IJ SOLN
INTRAMUSCULAR | Status: AC | PRN
  Administered 2018-03-03 (×2): 1 mg via INTRAVENOUS

## 2018-03-03 MED ORDER — HYDROMORPHONE HCL 2 MG/ML IJ SOLN
1.0000 mg | INTRAMUSCULAR | Status: DC | PRN
  Administered 2018-03-03: 1 mg via INTRAVENOUS

## 2018-03-03 MED ORDER — MIDAZOLAM HCL 2 MG/2ML IJ SOLN
INTRAMUSCULAR | Status: AC
  Filled 2018-03-03: qty 2

## 2018-03-03 NOTE — Sedation Documentation (Signed)
Patient reports of decreased pain 2/10.

## 2018-03-03 NOTE — Sedation Documentation (Addendum)
Bed Rest to start at 0830. 

## 2018-03-03 NOTE — Sedation Documentation (Signed)
Patient denies pain and is resting comfortably.  

## 2018-03-03 NOTE — Discharge Instructions (Signed)
Liver Biopsy, Care After °These instructions give you information on caring for yourself after your procedure. Your doctor may also give you more specific instructions. Call your doctor if you have any problems or questions after your procedure. °Follow these instructions at home: °· Rest at home for 1-2 days or as told by your doctor. °· Have someone stay with you for at least 24 hours. °· Do not do these things in the first 24 hours: °? Drive. °? Use machinery. °? Take care of other people. °? Sign legal documents. °? Take a bath or shower. °· There are many different ways to close and cover a cut (incision). For example, a cut can be closed with stitches, skin glue, or adhesive strips. Follow your doctor's instructions on: °? Taking care of your cut. °? Changing and removing your bandage (dressing). °? Removing whatever was used to close your cut. °· Do not drink alcohol in the first week. °· Do not lift more than 5 pounds or play contact sports for the first 2 weeks. °· Take medicines only as told by your doctor. For 1 week, do not take medicine that has aspirin in it or medicines like ibuprofen. °· Get your test results. °Contact a doctor if: °· A cut bleeds and leaves more than just a small spot of blood. °· A cut is red, puffs up (swells), or hurts more than before. °· Fluid or something else comes from a cut. °· A cut smells bad. °· You have a fever or chills. °Get help right away if: °· You have swelling, bloating, or pain in your belly (abdomen). °· You get dizzy or faint. °· You have a rash. °· You feel sick to your stomach (nauseous) or throw up (vomit). °· You have trouble breathing, feel short of breath, or feel faint. °· Your chest hurts. °· You have problems talking or seeing. °· You have trouble balancing or moving your arms or legs. °This information is not intended to replace advice given to you by your health care provider. Make sure you discuss any questions you have with your health care  provider. °Document Released: 08/11/2008 Document Revised: 04/09/2016 Document Reviewed: 12/29/2013 °Elsevier Interactive Patient Education © 2018 [employer] ° °

## 2018-03-03 NOTE — Progress Notes (Signed)
Pt stated that the itching had stop and denies any pain at this time   Pain med effective

## 2018-03-03 NOTE — Progress Notes (Signed)
PA paged for patient with nausea and itching.  Patient received 1mg  dilaudid at procedure and an additional 1mg  dilaudid.  PA to bedside for assessment.  Patient without skin irritation or hives.  Several red areas due to scratching.  Likely with side effect from dilaudid.  Given 25 mg benadryl.   Loyce DysKacie Matthews, MS RD PA-C 11:21 AM

## 2018-03-03 NOTE — H&P (Signed)
Chief Complaint: Patient was seen in consultation today for elevated liver enzymes  Referring Physician(s): RUEAVW,[id number]  Supervising Physician: Malachy Moan  Patient Status: [facility] - Out-pt  History of Present Illness: Breanna [name] is a 32 y.o. female with past medical history of asthma and PCOS who presents with elevated liver enzymes identify by PCP.  IR consulted for liver biopsy at the request of Dr. Cloretta Ned.   Patient presents for procedure today in her usual state of health.  She has been NPO.  She does not take blood thinners.   Past Medical History:  Diagnosis Date  . Asthma   . Depression    not current was 10 years ago  . Polycystic ovarian syndrome     Past Surgical History:  Procedure Laterality Date  . FOOT SURGERY    . NO PAST SURGERIES    . WISDOM TOOTH EXTRACTION      Allergies: Iodine; Morphine and related; Avelox [moxifloxacin hcl in nacl]; and Percocet [oxycodone-acetaminophen]  Medications: Prior to Admission medications   Medication Sig Start Date End Date Taking? Authorizing Provider  ergocalciferol (VITAMIN D2) 50000 units capsule Take 50,000 Units by mouth once a week. Wednesdays   Yes [provider]  ibuprofen (ADVIL,MOTRIN) 200 MG tablet Take 600-800 mg by mouth every 8 (eight) hours as needed for mild pain or moderate pain.   Yes [provider]  levonorgestrel (MIRENA) 20 MCG/24HR IUD 1 each by Intrauterine route continuous.   Yes [provider]  albuterol (PROVENTIL HFA;VENTOLIN HFA) 108 (90 Base) MCG/ACT inhaler Inhale 2 puffs into the lungs every 4 (four) hours as needed for wheezing or shortness of breath.    [provider]     Family History  Problem Relation Age of Onset  . Bipolar disorder Father   . Alcohol abuse Father   . Heart disease Brother   . Dementia Paternal Uncle   . Heart disease Maternal Grandmother        a fib  . Cancer Maternal Grandmother        breast  .  Dementia Maternal Grandfather   . Hyperlipidemia Maternal Grandfather   . Diabetes Maternal Grandfather     Social History   Socioeconomic History  . Marital status: Married    Spouse name: Not on file  . Number of children: Not on file  . Years of education: Not on file  . Highest education level: Not on file  Occupational History  . Not on file  Social Needs  . Financial resource strain: Not on file  . Food insecurity:    Worry: Not on file    Inability: Not on file  . Transportation needs:    Medical: Not on file    Non-medical: Not on file  Tobacco Use  . Smoking status: Current Some Day Smoker    Packs/day: 0.50    Types: Cigarettes  . Smokeless tobacco: Never Used  Substance and Sexual Activity  . Alcohol use: No    Alcohol/week: 0.0 oz  . Drug use: Yes    Types: Marijuana  . Sexual activity: Yes    Partners: Male    Birth control/protection: IUD  Lifestyle  . Physical activity:    Days per week: Not on file    Minutes per session: Not on file  . Stress: Not on file  Relationships  . Social connections:    Talks on phone: Not on file    Gets together: Not on file  Attends religious service: Not on file    Active member of club or organization: Not on file    Attends meetings of clubs or organizations: Not on file    Relationship status: Not on file  Other Topics Concern  . Not on file  Social History Narrative  . Not on file     Review of Systems: A 12 point ROS discussed and pertinent positives are indicated in the HPI above.  All other systems are negative.  Review of Systems  Constitutional: Negative for fatigue and fever.  Respiratory: Negative for cough and shortness of breath.   Cardiovascular: Negative for chest pain.  Gastrointestinal: Negative for abdominal pain.  Psychiatric/Behavioral: Negative for behavioral problems and confusion.    Vital Signs: BP 108/68 (BP Location: Left Arm)   Pulse (!) 56   Temp 98.3 F (36.8 C) (Oral)    Resp 17   Ht 5\' 8"  (1.727 m)   Wt 250 lb (113.4 kg)   SpO2 99%   BMI 38.01 kg/m   Physical Exam  Constitutional: She is oriented to person, place, and time. She appears well-developed.  Cardiovascular: Normal rate, regular rhythm and normal heart sounds.  Pulmonary/Chest: Effort normal and breath sounds normal. No respiratory distress.  Abdominal: Soft. She exhibits no distension. There is no tenderness.  Neurological: She is alert and oriented to person, place, and time.  Skin: Skin is warm and dry.  Psychiatric: She has a normal mood and affect. Her behavior is normal. Judgment and thought content normal.  Nursing note and vitals reviewed.    MD Evaluation Airway: WNL Heart: WNL Abdomen: WNL Chest/ Lungs: WNL ASA  Classification: 3 Mallampati/Airway Score: One   Imaging: No results found.  Labs:  CBC: Recent Labs    03/03/18 0606  WBC 9.5  HGB 14.9  HCT 43.7  PLT 249    COAGS: No results for input(s): INR, APTT in the last 8760 hours.  BMP: No results for input(s): NA, K, CL, CO2, GLUCOSE, BUN, CALCIUM, CREATININE, GFRNONAA, GFRAA in the last 8760 hours.  Invalid input(s): CMP  LIVER FUNCTION TESTS: No results for input(s): BILITOT, AST, ALT, ALKPHOS, PROT, ALBUMIN in the last 8760 hours.  TUMOR MARKERS: No results for input(s): AFPTM, CEA, CA199, CHROMGRNA in the last 8760 hours.  Assessment and Plan: Patient with past medical history of asthma and PCOS presents with complaint of elevated liver enzymes.  IR consulted for random liver biopsy at the request of Dr. Cloretta NedShahid. Patient presents today in their usual state of health.  She has been NPO and is not currently on blood thinners.   Risks and benefits discussed with the patient including, but not limited to bleeding, infection, damage to adjacent structures or low yield requiring additional tests.  All of the patient's questions were answered, patient is agreeable to proceed. Consent signed and  in chart.   Thank you for this interesting consult.  I greatly enjoyed meeting Breanna MarionMargo E J [name] and look forward to participating in their care.  A copy of this report was sent to the requesting provider on this date.  Electronically Signed: Hoyt KochKacie Sue-Ellen Matthews, PA 03/03/2018, 7:44 AM   I spent a total of  30 Minutes   in face to face in clinical consultation, greater than 50% of which was counseling/coordinating care for elevated liver enzymes.

## 2018-03-03 NOTE — Sedation Documentation (Signed)
Patient is resting comfortably. 

## 2018-03-03 NOTE — Procedures (Signed)
Interventional Radiology Procedure Note  Procedure: US guided liver biopsy  Complications: None immediate  Estimated Blood Loss: <25 mL  Recommendations: - Bedrest 3 hrs - Dilaudid for pain if needed  Signed,  Sterling BigHeath K. Ilayda Toda, MD

## 2018-03-03 NOTE — Sedation Documentation (Signed)
Patient complaining of increased pain. Writer informed Dr. Archer AsaMcCullough verbal orders obtained.

## 2018-04-10 ENCOUNTER — Encounter (HOSPITAL_COMMUNITY): Payer: Self-pay

## 2018-04-10 ENCOUNTER — Emergency Department (HOSPITAL_COMMUNITY)
Admission: EM | Admit: 2018-04-10 | Discharge: 2018-04-10 | Disposition: A | Discharge status: Home / Self Care | Payer: Medicaid Other | Attending: Emergency Medicine | Admitting: Emergency Medicine

## 2018-04-10 DIAGNOSIS — F1210 Cannabis abuse, uncomplicated: Secondary | ICD-10-CM | POA: Diagnosis not present

## 2018-04-10 DIAGNOSIS — S91002A Unspecified open wound, left ankle, initial encounter: Secondary | ICD-10-CM | POA: Insufficient documentation

## 2018-04-10 DIAGNOSIS — Y929 Unspecified place or not applicable: Secondary | ICD-10-CM | POA: Diagnosis not present

## 2018-04-10 DIAGNOSIS — J45909 Unspecified asthma, uncomplicated: Secondary | ICD-10-CM | POA: Diagnosis not present

## 2018-04-10 DIAGNOSIS — Y998 Other external cause status: Secondary | ICD-10-CM | POA: Insufficient documentation

## 2018-04-10 DIAGNOSIS — Y9389 Activity, other specified: Secondary | ICD-10-CM | POA: Insufficient documentation

## 2018-04-10 DIAGNOSIS — T148XXA Other injury of unspecified body region, initial encounter: Secondary | ICD-10-CM

## 2018-04-10 DIAGNOSIS — F17210 Nicotine dependence, cigarettes, uncomplicated: Secondary | ICD-10-CM | POA: Diagnosis not present

## 2018-04-10 DIAGNOSIS — S99912A Unspecified injury of left ankle, initial encounter: Secondary | ICD-10-CM | POA: Diagnosis present

## 2018-04-10 DIAGNOSIS — W228XXA Striking against or struck by other objects, initial encounter: Secondary | ICD-10-CM | POA: Diagnosis not present

## 2018-04-10 MED ORDER — BACITRACIN ZINC 500 UNIT/GM EX OINT
TOPICAL_OINTMENT | Freq: Two times a day (BID) | CUTANEOUS | Status: DC
  Administered 2018-04-10: 18:00:00 via TOPICAL
  Filled 2018-04-10: qty 0.9

## 2018-04-10 MED ORDER — LIDOCAINE HCL 2 % IJ SOLN
5.0000 mL | Freq: Once | INTRAMUSCULAR | Status: AC
  Administered 2018-04-10: 100 mg
  Filled 2018-04-10: qty 20
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
## 2018-04-10 NOTE — ED Notes (Signed)
No answer from pt in waiting room 

## 2018-04-10 NOTE — ED Notes (Signed)
ED Provider at bedside. 

## 2018-04-10 NOTE — ED Triage Notes (Signed)
Pt presents for laceration to L lateral ankle today.

## 2018-04-10 NOTE — Discharge Instructions (Signed)
Please read and follow all provided instructions.  Your diagnoses today include:  1. Skin avulsion     Tests performed today include:  Vital signs. See below for your results today.   Medications prescribed:   None  Take any prescribed medications only as directed.   Home care instructions:  Follow any educational materials contained in this packet. Keep affected area above the level of your heart when possible. Wash area gently twice a day with warm soapy water. Do not apply alcohol or hydrogen peroxide. Cover the area if it draining or weeping.   Follow-up instructions: Please follow-up with your primary care provider as needed for further evaluation of your symptoms.   Return instructions:  Return to the Emergency Department if you have:  Fever  Worsening symptoms  Worsening pain  Worsening swelling  Redness of the skin that moves away from the affected area, especially if it streaks away from the affected area   Any other emergent concerns  Your vital signs today were: BP 121/87 (BP Location: Right Arm)    Pulse 81    Temp 98 F (36.7 C) (Oral)    Resp 16    SpO2 100%  If your blood pressure (BP) was elevated above 135/85 this visit, please have this repeated by your doctor within one month. --------------

## 2018-09-05 ENCOUNTER — Ambulatory Visit: Payer: Medicaid Other | Admitting: Advanced Practice Midwife

## 2018-09-05 NOTE — Progress Notes (Deleted)
Subjective:     Breanna [name] is a 32 y.o. female here for a routine exam.  Current complaints: ***.  Personal health questionnaire reviewed: {yes/no:9010}.   Gynecologic History No LMP recorded. (Menstrual status: IUD). Contraception: {method:5051} Last Pap: ***. Results were: {norm/abn:16337} Last mammogram: ***. Results were: {norm/abn:16337}  Obstetric History OB History  Gravida Para Term Preterm AB Living  2 2 2     2   SAB TAB Ectopic Multiple Live Births          2    # Outcome Date GA Lbr Len/2nd Weight Sex Delivery Anes PTL Lv  2 Term 03/31/13 [redacted]w[redacted]d 02:17 / 00:09 3890 g M Vag-Spont EPI  LIV  1 Term 08/27/11 [redacted]w[redacted]d 26:35 / 00:15 3997 g M Vag-Spont EPI  LIV     Birth Comments: WNL     {Common ambulatory SmartLinks:19316}  Review of Systems {ros; complete:30496}    Objective:    {exam; complete:18323}    Assessment/Plan:   There are no diagnoses linked to this encounter.  Sharen Counter, CNM 8:35 AM

## 2018-09-26 ENCOUNTER — Ambulatory Visit: Payer: Medicaid Other | Admitting: Advanced Practice Midwife

## 2019-04-15 ENCOUNTER — Emergency Department (HOSPITAL_COMMUNITY)
Admission: EM | Admit: 2019-04-15 | Discharge: 2019-04-15 | Disposition: A | Discharge status: Home / Self Care | Payer: Medicaid Other | Attending: Emergency Medicine | Admitting: Emergency Medicine

## 2019-04-15 ENCOUNTER — Other Ambulatory Visit: Payer: Self-pay

## 2019-04-15 ENCOUNTER — Encounter (HOSPITAL_COMMUNITY): Payer: Self-pay | Admitting: Emergency Medicine

## 2019-04-15 DIAGNOSIS — J45909 Unspecified asthma, uncomplicated: Secondary | ICD-10-CM | POA: Insufficient documentation

## 2019-04-15 DIAGNOSIS — F17210 Nicotine dependence, cigarettes, uncomplicated: Secondary | ICD-10-CM | POA: Diagnosis not present

## 2019-04-15 DIAGNOSIS — M545 Low back pain: Secondary | ICD-10-CM | POA: Diagnosis present

## 2019-04-15 DIAGNOSIS — M5442 Lumbago with sciatica, left side: Secondary | ICD-10-CM | POA: Insufficient documentation

## 2019-04-15 DIAGNOSIS — Z79899 Other long term (current) drug therapy: Secondary | ICD-10-CM | POA: Insufficient documentation

## 2019-04-15 DIAGNOSIS — G8929 Other chronic pain: Secondary | ICD-10-CM

## 2019-04-15 MED ORDER — METHOCARBAMOL 500 MG PO TABS: 500.0000 mg | ORAL_TABLET | Freq: Two times a day (BID) | ORAL | 0 refills | Status: DC | PRN

## 2019-04-15 MED ORDER — PREDNISONE 10 MG (21) PO TBPK: ORAL_TABLET | Freq: Every day | ORAL | 0 refills | Status: DC

## 2019-04-15 NOTE — ED Provider Notes (Signed)
[facility] Provider Note   CSN: [id number] Arrival date & time: 04/15/19  1218    History   Chief Complaint Chief Complaint  Patient presents with  . Back Pain    HPI Breanna [name] is a 33 y.o. female presenting for evaluation of back pain.  Patient states she has been having consistent low back pain for the past 3 months.  Her back pain originally began when she was in her 920s, pain has been intermittent since then.  She denies a precipitating event 3 months ago, but states that she often lifts her 576 and 32-year-old children, and she has been sleeping on a broken bed.  She reports pain is of her low back in the middle, occasionally radiating down her left leg, especially with lifting her left leg.  She denies fevers, chills, cough, chest pain, shortness breath, nausea, vomiting, urinary symptoms, loss of bowel bladder control, numbness, tingling.  She denies history of cancer or IVDU.  She states she has tried ibuprofen, Flexeril, lidocaine patches, and Vicodin without improvement of her symptoms.  She has not been able to follow-up with her primary care due to scheduling.  She states she is here in the ER today because somebody was able to watch her children, no change in her symptoms.  Patient states previously she has had her back adjusted by a chiropractor with improvement of her symptoms, but she has not seen a chiropractor recently.  She has never seen orthopedics for this.  Her pain is worse when she first wakes up in the morning, when she goes to sit down, or stand up.  Nothing makes it better.     HPI  Past Medical History:  Diagnosis Date  . Asthma   . Depression    not current was 10 years ago  . Polycystic ovarian syndrome     Patient Active Problem List   Diagnosis Date Noted  . PCOS (polycystic ovarian syndrome) 05/10/2013  . MAJOR DEPRESSIVE DISORDER SINGLE EPISODE UNSPEC 10/24/2007  . ASTHMA 10/24/2007    Past Surgical  History:  Procedure Laterality Date  . FOOT SURGERY    . NO PAST SURGERIES    . WISDOM TOOTH EXTRACTION       OB History    Gravida  2   Para  2   Term  2   Preterm      AB      Living  2     SAB      TAB      Ectopic      Multiple      Live Births  2            Home Medications    Prior to Admission medications   Medication Sig Start Date End Date Taking? Authorizing Provider  albuterol (PROVENTIL HFA;VENTOLIN HFA) 108 (90 Base) MCG/ACT inhaler Inhale 2 puffs into the lungs every 4 (four) hours as needed for wheezing or shortness of breath.    [provider]  ergocalciferol (VITAMIN D2) 50000 units capsule Take 50,000 Units by mouth once a week. Wednesdays    [provider]  ibuprofen (ADVIL,MOTRIN) 200 MG tablet Take 600-800 mg by mouth every 8 (eight) hours as needed for mild pain or moderate pain.    [provider]  levonorgestrel (MIRENA) 20 MCG/24HR IUD 1 each by Intrauterine route continuous.    [provider]  methocarbamol (ROBAXIN) 500 MG tablet Take 1 tablet (500  mg total) by mouth 2 (two) times daily as needed for muscle spasms. 04/15/19   Deleah Tison, PA-C  predniSONE (STERAPRED UNI-PAK 21 TAB) 10 MG (21) TBPK tablet Take by mouth daily. Take 6 tabs by mouth daily  for 2 days, then 5 tabs for 2 days, then 4 tabs for 2 days, then 3 tabs for 2 days, 2 tabs for 2 days, then 1 tab by mouth daily for 2 days 04/15/19   Pearlean Sabina, PA-C    Family History Family History  Problem Relation Age of Onset  . Bipolar disorder Father   . Alcohol abuse Father   . Heart disease Brother   . Dementia Paternal Uncle   . Heart disease Maternal Grandmother        a fib  . Cancer Maternal Grandmother        breast  . Dementia Maternal Grandfather   . Hyperlipidemia Maternal Grandfather   . Diabetes Maternal Grandfather     Social History Social History   Tobacco Use  . Smoking status: Current Some Day Smoker     Packs/day: 0.50    Types: Cigarettes  . Smokeless tobacco: Never Used  Substance Use Topics  . Alcohol use: No    Alcohol/week: 0.0 standard drinks  . Drug use: Yes    Types: Marijuana     Allergies   Iodine; Morphine and related; Avelox [moxifloxacin hcl in nacl]; and Percocet [oxycodone-acetaminophen]   Review of Systems Review of Systems  Musculoskeletal: Positive for back pain.  Neurological: Negative for numbness.     Physical Exam Updated Vital Signs BP 116/79 (BP Location: Right Arm)   Pulse 90   Temp 98.2 F (36.8 C) (Oral)   Resp 16   Ht  (1.727 m)   Wt 113.4 kg   LMP 03/01/2019 (Approximate)   SpO2 97%   BMI 38.01 kg/m   Physical Exam Vitals signs and nursing note reviewed.  Constitutional:      General: She is not in acute distress.    Appearance: She is well-developed.     Comments: Obese female resting comfortably in the bed in no acute distress  HENT:     Head: Normocephalic and atraumatic.  Eyes:     Conjunctiva/sclera: Conjunctivae normal.     Pupils: Pupils are equal, round, and reactive to light.  Neck:     Musculoskeletal: Normal range of motion and neck supple.  Cardiovascular:     Rate and Rhythm: Normal rate and regular rhythm.     Pulses: Normal pulses.  Pulmonary:     Effort: Pulmonary effort is normal. No respiratory distress.     Breath sounds: Normal breath sounds. No wheezing.  Abdominal:     General: There is no distension.     Palpations: Abdomen is soft. There is no mass.     Tenderness: There is no abdominal tenderness. There is no guarding or rebound.  Musculoskeletal: Normal range of motion.     Comments: Tenderness to palpation of low back around L4-L5 without step-offs or deformities.  No rash or signs of injury.  Increased pain with straight leg raise on the left side.  Pedal pulses intact bilaterally.  Patellar reflexes symmetric.  No saddle paresthesias. Pt is ambulatory without limp  Skin:    General: Skin  is warm and dry.     Capillary Refill: Capillary refill takes less than 2 seconds.  Neurological:     Mental Status: She is alert and oriented to person, place,  and time.      ED Treatments / Results  Labs (all labs ordered are listed, but only abnormal results are displayed) Labs Reviewed - No data to display  EKG None  Radiology No results found.  Procedures Procedures (including critical care time)  Medications Ordered in ED Medications - No data to display   Initial Impression / Assessment and Plan / ED Course  I have reviewed the triage vital signs and the nursing notes.  Pertinent labs & imaging results that were available during my care of the patient were reviewed by me and considered in my medical decision making (see chart for details).        Patient presenting for evaluation of low back pain.  Physical exam reassuring, neurovascularly intact.  No red flags for back pain.  Pain is reproducible with palpation.  Likely musculoskeletal.  Doubt fracture, I do not believe x-rays will be beneficial.  Doubt vertebral injury, infection, spinal cord compression, myelopathy, or cauda equina syndrome.  Has pain occasionally radiates down her left leg, and she had a positive straight leg raise, will treat with prednisone.  Will switch patient's muscle relaxer.  Encourage patient to follow-up with orthopedics and/or her primary care, as she may benefit from PT. At this time, patient appears safe for discharge.  Return precautions given.  Patient states she understands agrees plan.   Final Clinical Impressions(s) / ED Diagnoses   Final diagnoses:  Chronic midline low back pain with left-sided sciatica    ED Discharge Orders         Ordered    predniSONE (STERAPRED UNI-PAK 21 TAB) 10 MG (21) TBPK tablet  Daily     04/15/19 1326    methocarbamol (ROBAXIN) 500 MG tablet  2 times daily PRN     04/15/19 1326           Deland Slocumb, PA-C 04/15/19 1339     Arby Barrette, MD 04/25/19 2012

## 2019-04-15 NOTE — ED Triage Notes (Signed)
Pt states she has been having consistent back pain X3 months. Patient denies injuring herself. She has no idea why it is hurting.  Pain 5/10.

## 2019-04-15 NOTE — Discharge Instructions (Addendum)
Take prednisone as prescribed take the entire course. Do not take other antiinflammatiories at the same time, such as ibuprofen, advil, or aleve.  Use Robaxin as needed for muscle stiffness or soreness. Have caution, as this may make you tired or groggy. Do not drive or operate heavy machinery while taking this medication.  Use muscle creams (bengay, icy hot, salonpas) as needed for pain.  Do the stretches in the paperwork daily. Follow up with your primary care doctor, the chiropractor, or orthopedics for further evaluation.  Return to the ER if you develop high fevers, numbness, loss of bowel or bladder control, or any new or concerning symptoms.

## 2019-06-26 ENCOUNTER — Encounter: Payer: Self-pay | Admitting: Nurse Practitioner

## 2019-06-26 ENCOUNTER — Ambulatory Visit: Payer: Medicaid Other | Admitting: Nurse Practitioner

## 2019-06-26 ENCOUNTER — Other Ambulatory Visit: Payer: Self-pay

## 2019-06-26 ENCOUNTER — Other Ambulatory Visit (HOSPITAL_COMMUNITY)
Admission: RE | Admit: 2019-06-26 | Discharge: 2019-06-26 | Disposition: A | Discharge status: Home / Self Care | Payer: Medicaid Other | Source: Ambulatory Visit | Attending: Nurse Practitioner | Admitting: Nurse Practitioner

## 2019-06-26 VITALS — BP 140/85 | HR 72 | Wt 271.0 lb

## 2019-06-26 DIAGNOSIS — Z0000 Encounter for general adult medical examination without abnormal findings: Secondary | ICD-10-CM

## 2019-06-26 DIAGNOSIS — Z01419 Encounter for gynecological examination (general) (routine) without abnormal findings: Secondary | ICD-10-CM | POA: Insufficient documentation

## 2019-06-26 DIAGNOSIS — Z30431 Encounter for routine checking of intrauterine contraceptive device: Secondary | ICD-10-CM

## 2019-06-26 DIAGNOSIS — F17200 Nicotine dependence, unspecified, uncomplicated: Secondary | ICD-10-CM | POA: Insufficient documentation

## 2019-06-26 DIAGNOSIS — Z6841 Body Mass Index (BMI) 40.0 and over, adult: Secondary | ICD-10-CM | POA: Insufficient documentation

## 2019-06-26 NOTE — Progress Notes (Signed)
GYNECOLOGY ANNUAL PREVENTATIVE CARE ENCOUNTER NOTE  Subjective:   Breanna [name] is a 33 y.o. [id number] female here for a routine annual gynecologic exam.  Current complaints: none.   Denies abnormal vaginal bleeding, discharge, pelvic pain, problems with intercourse or other gynecologic concerns.   Client has significant social stressors with 2 children that are ages 616 and 7 who both have diagnoses of ADHD and autism - neither are toilet trained.  Has lots of medical appointments for early intervention for both.  Has almost no social support for care of the children - she is their main caregiver.  Children have been out of school since January 31, 2019.  Currently under care of her PCP for low back pain and is currently taking prednisone.  No other problems voiced.   Gynecologic History No LMP recorded. (Menstrual status: IUD). Contraception: IUD - Mirena Last Pap: more than 3 years ago. Results were: normal Last mammogram: NA.   Obstetric History OB History  Gravida Para Term Preterm AB Living  2 2 2     2   SAB TAB Ectopic Multiple Live Births          2    # Outcome Date GA Lbr Len/2nd Weight Sex Delivery Anes PTL Lv  2 Term 03/31/13 4146w1d 02:17 / 00:09 8 lb 9.2 oz (3.89 kg) M Vag-Spont EPI  LIV  1 Term 08/27/11 6294w4d 26:35 / 00:15 8 lb 13 oz (3.997 kg) M Vag-Spont EPI  LIV     Birth Comments: WNL    Past Medical History:  Diagnosis Date  . Asthma   . Depression    not current was 10 years ago  . Polycystic ovarian syndrome     Past Surgical History:  Procedure Laterality Date  . FOOT SURGERY    . NO PAST SURGERIES    . WISDOM TOOTH EXTRACTION      Current Outpatient Medications on File Prior to Visit  Medication Sig Dispense Refill  . albuterol (PROVENTIL HFA;VENTOLIN HFA) 108 (90 Base) MCG/ACT inhaler Inhale 2 puffs into the lungs every 4 (four) hours as needed for wheezing or shortness of breath.    . ergocalciferol (VITAMIN D2) 50000 units capsule Take 50,000 Units  by mouth once a week. Wednesdays    . levonorgestrel (MIRENA) 20 MCG/24HR IUD 1 each by Intrauterine route continuous.    . predniSONE (STERAPRED UNI-PAK 21 TAB) 10 MG (21) TBPK tablet Take by mouth daily. Take 6 tabs by mouth daily  for 2 days, then 5 tabs for 2 days, then 4 tabs for 2 days, then 3 tabs for 2 days, 2 tabs for 2 days, then 1 tab by mouth daily for 2 days 42 tablet 0  . ibuprofen (ADVIL,MOTRIN) 200 MG tablet Take 600-800 mg by mouth every 8 (eight) hours as needed for mild pain or moderate pain.    . methocarbamol (ROBAXIN) 500 MG tablet Take 1 tablet (500 mg total) by mouth 2 (two) times daily as needed for muscle spasms. (Patient not taking: Reported on 06/26/2019) 20 tablet 0   No current facility-administered medications on file prior to visit.     Allergies  Allergen Reactions  . Iodine Rash  . Morphine And Related Nausea And Vomiting  . Avelox [Moxifloxacin Hcl In Nacl] Rash  . Percocet [Oxycodone-Acetaminophen] Hives and Rash    Social History   Socioeconomic History  . Marital status: Married    Spouse name: Not on file  . Number of children:  Not on file  . Years of education: Not on file  . Highest education level: Not on file  Occupational History  . Not on file  Social Needs  . Financial resource strain: Not on file  . Food insecurity    Worry: Not on file    Inability: Not on file  . Transportation needs    Medical: Not on file    Non-medical: Not on file  Tobacco Use  . Smoking status: Current Some Day Smoker    Packs/day: 0.50    Types: Cigarettes  . Smokeless tobacco: Never Used  Substance and Sexual Activity  . Alcohol use: No    Alcohol/week: 0.0 standard drinks  . Drug use: Yes    Types: Marijuana  . Sexual activity: Yes    Partners: Male    Birth control/protection: I.U.D.  Lifestyle  . Physical activity    Days per week: Not on file    Minutes per session: Not on file  . Stress: Not on file  Relationships  . Social Product manager on phone: Not on file    Gets together: Not on file    Attends religious service: Not on file    Active member of club or organization: Not on file    Attends meetings of clubs or organizations: Not on file    Relationship status: Not on file  . Intimate partner violence    Fear of current or ex partner: Not on file    Emotionally abused: Not on file    Physically abused: Not on file    Forced sexual activity: Not on file  Other Topics Concern  . Not on file  Social History Narrative  . Not on file    Family History  Problem Relation Age of Onset  . Bipolar disorder Father   . Alcohol abuse Father   . Heart disease Brother   . Dementia Paternal Uncle   . Heart disease Maternal Grandmother        a fib  . Cancer Maternal Grandmother        breast  . Dementia Maternal Grandfather   . Hyperlipidemia Maternal Grandfather   . Diabetes Maternal Grandfather     The following portions of the patient's history were reviewed and updated as appropriate: allergies, current medications, past family history, past medical history, past social history, past surgical history and problem list.  Review of Systems Pertinent items noted in HPI and remainder of comprehensive ROS otherwise negative.   Objective:  BP 140/85   Pulse 72   Wt 271 lb (122.9 kg)   BMI 41.21 kg/m  CONSTITUTIONAL: Well-developed, well-nourished female in no acute distress.  HENT:  Normocephalic, atraumatic, External right and left ear normal.  EYES: Conjunctivae and EOM are normal. Pupils are equal, round.  No scleral icterus.  NECK: Normal range of motion, supple, no masses.  Normal thyroid.  SKIN: Skin is warm and dry. No rash noted. Not diaphoretic. No erythema. No pallor. NEUROLOGIC: Alert and oriented to person, place, and time. Normal reflexes, muscle tone coordination. No cranial nerve deficit noted. PSYCHIATRIC: Normal mood and affect. Normal behavior. Normal judgment and thought content.  CARDIOVASCULAR: Normal heart rate noted, regular rhythm RESPIRATORY: Clear to auscultation bilaterally. Effort and breath sounds normal, no problems with respiration noted. BREASTS: Symmetric in size. No masses, skin changes, nipple drainage, or lymphadenopathy. One scar out outer, lower quadrant on right breast from previous infection. ABDOMEN: Soft, no distention noted.  No  tenderness, rebound or guarding.  PELVIC: Normal appearing external genitalia; normal appearing vaginal mucosa and cervix.  No abnormal discharge noted.  Pap smear obtained.  Normal uterine size, no other palpable masses, no uterine or adnexal tenderness.  IUD strings seen coming from os - .05 cm in length MUSCULOSKELETAL: Normal range of motion. No tenderness.  No cyanosis, clubbing, or edema.    Assessment and Plan:  1. Women's annual routine gynecological examination Has a PCP and is being managed currently for low back pain - is taking prednisone.  BP today is borderline high.  Advised to see PCP for BP evaluation.  Client thinks he BP is up today due to her low back pain and medication. Plans on continuing her IUD.  2. BMI 40.0-44.9, adult (HCC) Weight loss advised and exercise encouraged.  Client expressed her limitations in being the primary caretaker of 2 highly dependent and specialized needs children.  3.  Everyday smoker Has quit previously and is somewhat interested in trying stopping smoking again.  Is getting ready for a move, so plans on trying after she is moved and settled.  Will follow up results of pap smear and manage accordingly. Routine preventative health maintenance measures emphasized. Please refer to After Visit Summary for other counseling recommendations.    Nolene BernheimERRI BURLESON, RN, MSN, NP-BC Nurse Practitioner, [facility]

## 2019-06-28 LAB — CYTOLOGY - PAP
Diagnosis: NEGATIVE
HPV: NOT DETECTED

## 2020-04-15 ENCOUNTER — Encounter (HOSPITAL_COMMUNITY): Payer: Self-pay | Admitting: Emergency Medicine

## 2020-04-15 ENCOUNTER — Emergency Department (HOSPITAL_COMMUNITY)
Admission: EM | Admit: 2020-04-15 | Discharge: 2020-04-15 | Disposition: A | Discharge status: Home / Self Care | Payer: Medicaid Other | Attending: Emergency Medicine | Admitting: Emergency Medicine

## 2020-04-15 ENCOUNTER — Other Ambulatory Visit: Payer: Self-pay

## 2020-04-15 DIAGNOSIS — F17210 Nicotine dependence, cigarettes, uncomplicated: Secondary | ICD-10-CM | POA: Diagnosis not present

## 2020-04-15 DIAGNOSIS — Z6839 Body mass index (BMI) 39.0-39.9, adult: Secondary | ICD-10-CM | POA: Insufficient documentation

## 2020-04-15 DIAGNOSIS — Z79899 Other long term (current) drug therapy: Secondary | ICD-10-CM | POA: Diagnosis not present

## 2020-04-15 DIAGNOSIS — E669 Obesity, unspecified: Secondary | ICD-10-CM | POA: Insufficient documentation

## 2020-04-15 DIAGNOSIS — R102 Pelvic and perineal pain: Secondary | ICD-10-CM | POA: Diagnosis present

## 2020-04-15 LAB — COMPREHENSIVE METABOLIC PANEL
ALT: 35 U/L (ref 0–44)
AST: 28 U/L (ref 15–41)
Albumin: 3.4 g/dL — ABNORMAL LOW (ref 3.5–5.0)
Alkaline Phosphatase: 107 U/L (ref 38–126)
Anion gap: 7 (ref 5–15)
BUN: 8 mg/dL (ref 6–20)
CO2: 22 mmol/L (ref 22–32)
Calcium: 8.9 mg/dL (ref 8.9–10.3)
Chloride: 111 mmol/L (ref 98–111)
Creatinine, Ser: 0.8 mg/dL (ref 0.44–1.00)
GFR calc Af Amer: >60 mL/min (ref >60)
GFR calc non Af Amer: >60 mL/min (ref >60)
Glucose, Bld: 95 mg/dL (ref 70–99)
Potassium: 4.2 mmol/L (ref 3.5–5.1)
Sodium: 140 mmol/L (ref 135–145)
Total Bilirubin: 0.4 mg/dL (ref 0.3–1.2)
Total Protein: 6.1 g/dL — ABNORMAL LOW (ref 6.5–8.1)

## 2020-04-15 LAB — CBC WITH DIFFERENTIAL/PLATELET
Abs Immature Granulocytes: 0.03 10*3/uL (ref 0.00–0.07)
Basophils Absolute: 0 10*3/uL (ref 0.0–0.1)
Basophils Relative: 0 %
Eosinophils Absolute: 0.2 10*3/uL (ref 0.0–0.5)
Eosinophils Relative: 2 %
HCT: 42.5 % (ref 36.0–46.0)
Hemoglobin: 14.3 g/dL (ref 12.0–15.0)
Immature Granulocytes: 0 %
Lymphocytes Relative: 25 %
Lymphs Abs: 1.9 10*3/uL (ref 0.7–4.0)
MCH: 31 pg (ref 26.0–34.0)
MCHC: 33.6 g/dL (ref 30.0–36.0)
MCV: 92 fL (ref 80.0–100.0)
Monocytes Absolute: 0.3 10*3/uL (ref 0.1–1.0)
Monocytes Relative: 4 %
Neutro Abs: 5.1 10*3/uL (ref 1.7–7.7)
Neutrophils Relative %: 69 %
Platelets: 231 10*3/uL (ref 150–400)
RBC: 4.62 MIL/uL (ref 3.87–5.11)
RDW: 13.1 % (ref 11.5–15.5)
WBC: 7.5 10*3/uL (ref 4.0–10.5)
nRBC: 0 % (ref 0.0–0.2)

## 2020-04-15 LAB — WET PREP, GENITAL
Clue Cells Wet Prep HPF POC: NONE SEEN
Sperm: NONE SEEN
Trich, Wet Prep: NONE SEEN
Yeast Wet Prep HPF POC: NONE SEEN

## 2020-04-15 LAB — RPR: RPR Ser Ql: NONREACTIVE

## 2020-04-15 LAB — HIV ANTIBODY (ROUTINE TESTING W REFLEX): HIV Screen 4th Generation wRfx: NONREACTIVE

## 2020-04-15 MED ORDER — AMOXICILLIN-POT CLAVULANATE 875-125 MG PO TABS: 1.0000 | ORAL_TABLET | Freq: Two times a day (BID) | ORAL | 0 refills | Status: DC

## 2020-04-15 NOTE — ED Triage Notes (Signed)
Pt endorses vaginal swelling since yesterday and intermittent pain for a week. Denies any discharge, N/V/D. Due for IUD to come out in about 3-4 days.

## 2020-04-15 NOTE — ED Notes (Signed)
Assisted doctor with pelvic exam

## 2020-04-15 NOTE — Discharge Instructions (Signed)
It appears that you have an infection, the lower part of the vagina.  There is a small skin abnormality that may require further assessment with biopsy or treatment.  Your gynecologist can help you with both of these problems when you see her in 3 days.  In the meantime soak in a warm tub, 2 or 3 times a day to help control your discomfort.  Taking Motrin or Tylenol should be adequate for pain.  Return here, if needed, for problems.

## 2020-04-15 NOTE — ED Notes (Signed)
Patient verbalizes understanding of discharge instructions. Opportunity for questioning and answers were provided. Armband removed by staff, pt discharged from ED.  

## 2020-04-15 NOTE — ED Provider Notes (Signed)
[facility] EMERGENCY DEPARTMENT Provider Note   CSN: [id number] Arrival date & time: 04/15/20  0747     History Chief Complaint  Patient presents with  . Vaginal Pain    Breanna [name] is a 34 y.o. female.  HPI She presents for evaluation of a sore in her lower vaginal area, present for a week and a half.  She is due to have her IUD changed in 3 days.  No recent trauma.  She denies vaginal discharge.  She had a normal menstrual cycle about 2 and half weeks ago.  She denies fever, chills, dysuria, constipation, weakness or dizziness.  She denies prior I&D's of the perineum.  She had a vaginal tear with delivery of a child, but not previously bothered by this.  There are no other known modifying factor orders.  Past Medical History:  Diagnosis Date  . Asthma   . Depression    not current was 10 years ago  . Polycystic ovarian syndrome     Patient Active Problem List   Diagnosis Date Noted  . BMI 40.0-44.9, adult ([place]) 06/26/2019  . Current every day smoker 06/26/2019  . PCOS (polycystic ovarian syndrome) 05/10/2013  . MAJOR DEPRESSIVE DISORDER SINGLE EPISODE UNSPEC 10/24/2007  . ASTHMA 10/24/2007    Past Surgical History:  Procedure Laterality Date  . FOOT SURGERY    . NO PAST SURGERIES    . WISDOM TOOTH EXTRACTION       OB History    Gravida  2   Para  2   Term  2   Preterm      AB      Living  2     SAB      TAB      Ectopic      Multiple      Live Births  2           Family History  Problem Relation Age of Onset  . Bipolar disorder Father   . Alcohol abuse Father   . Heart disease Brother   . Dementia Paternal Uncle   . Heart disease Maternal Grandmother        a fib  . Cancer Maternal Grandmother        breast  . Dementia Maternal Grandfather   . Hyperlipidemia Maternal Grandfather   . Diabetes Maternal Grandfather     Social History   Tobacco Use  . Smoking status: Current Some Day Smoker    Packs/day:  0.50    Types: Cigarettes  . Smokeless tobacco: Never Used  Substance Use Topics  . Alcohol use: No    Alcohol/week: 0.0 standard drinks  . Drug use: Yes    Types: Marijuana    Home Medications Prior to Admission medications   Medication Sig Start Date End Date Taking? Authorizing Provider  albuterol (PROVENTIL HFA;VENTOLIN HFA) 108 (90 Base) MCG/ACT inhaler Inhale 2 puffs into the lungs every 4 (four) hours as needed for wheezing or shortness of breath.    [provider]  amoxicillin-clavulanate (AUGMENTIN) 875-125 MG tablet Take 1 tablet by mouth 2 (two) times daily. One po bid x 7 days 04/15/20   Daleen Bo, MD  ergocalciferol (VITAMIN D2) 50000 units capsule Take 50,000 Units by mouth once a week. Wednesdays    [provider]  ibuprofen (ADVIL,MOTRIN) 200 MG tablet Take 600-800 mg by mouth every 8 (eight) hours as needed for mild pain or moderate pain.    [provider]  levonorgestrel (MIRENA) 20 MCG/24HR IUD 1 each by Intrauterine route continuous.    [provider]  methocarbamol (ROBAXIN) 500 MG tablet Take 1 tablet (500 mg total) by mouth 2 (two) times daily as needed for muscle spasms. Patient not taking: Reported on 06/26/2019 04/15/19   Caccavale, Sophia, PA-C  predniSONE (STERAPRED UNI-PAK 21 TAB) 10 MG (21) TBPK tablet Take by mouth daily. Take 6 tabs by mouth daily  for 2 days, then 5 tabs for 2 days, then 4 tabs for 2 days, then 3 tabs for 2 days, 2 tabs for 2 days, then 1 tab by mouth daily for 2 days 04/15/19   Caccavale, Sophia, PA-C    Allergies    Iodine, Morphine and related, Avelox [moxifloxacin hcl in nacl], and Percocet [oxycodone-acetaminophen]  Review of Systems   Review of Systems  All other systems reviewed and are negative.   Physical Exam Updated Vital Signs BP 134/83 (BP Location: Right Arm)   Pulse 77   Temp 97.9 F (36.6 C) (Oral)   Resp 14   Ht 5\' 8"  (1.727 m)   Wt 117.9 kg   LMP 04/01/2020   SpO2 100%    BMI 39.53 kg/m   Physical Exam Vitals and nursing note reviewed.  Constitutional:      General: She is not in acute distress.    Appearance: She is well-developed. She is obese. She is not ill-appearing, toxic-appearing or diaphoretic.  HENT:     Head: Normocephalic and atraumatic.     Right Ear: External ear normal.     Left Ear: External ear normal.  Eyes:     Conjunctiva/sclera: Conjunctivae normal.     Pupils: Pupils are equal, round, and reactive to light.  Neck:     Trachea: Phonation normal.  Cardiovascular:     Rate and Rhythm: Normal rate.  Pulmonary:     Effort: Pulmonary effort is normal.  Abdominal:     General: There is no distension.     Palpations: Abdomen is soft.     Tenderness: There is no abdominal tenderness.  Genitourinary:    Comments: External female genitalia remarkable for vague swelling of the perineal tissue to the right inferior vaginal region.  No overlying skin changes.  Palpation of this area is somewhat indurated with a questionable mass, not clearly abscess or cyst.  This area is tender to palpation.  This area does not appear to be infected Bartholin's gland cyst.  Essentially, overlying this perineal induration, is a 3 to 4 mm skin lesion, consistent with old scar versus condylomata.  On speculum exam of the vagina there is small amount of opaque white vaginal discharge.  The cervix is anteflexed, but appears normal otherwise.  On bimanual examination there is no tenderness in the region of the uterus or adnexa.  There is no palpable pelvic mass.  There are no other external or internal vaginal lesions, appreciated. Musculoskeletal:        General: Normal range of motion.     Cervical back: Normal range of motion and neck supple.  Skin:    General: Skin is warm and dry.  Neurological:     Mental Status: She is alert and oriented to person, place, and time.     Cranial Nerves: No cranial nerve deficit.     Sensory: No sensory deficit.      Motor: No abnormal muscle tone.     Coordination: Coordination normal.  Psychiatric:  Behavior: Behavior normal.        Thought Content: Thought content normal.        Judgment: Judgment normal.     ED Results / Procedures / Treatments   Labs (all labs ordered are listed, but only abnormal results are displayed) Labs Reviewed  WET PREP, GENITAL - Abnormal; Notable for the following components:      Result Value   WBC, Wet Prep HPF POC FEW (*)    All other components within normal limits  COMPREHENSIVE METABOLIC PANEL - Abnormal; Notable for the following components:   Total Protein 6.1 (*)    Albumin 3.4 (*)    All other components within normal limits  CBC WITH DIFFERENTIAL/PLATELET  RPR  HIV ANTIBODY (ROUTINE TESTING W REFLEX)  GC/CHLAMYDIA PROBE AMP ([place]) NOT AT [facility]    EKG None  Radiology No results found.  Procedures Procedures (including critical care time)  Medications Ordered in ED Medications - No data to display  ED Course  I have reviewed the triage vital signs and the nursing notes.  Pertinent labs & imaging results that were available during my care of the patient were reviewed by me and considered in my medical decision making (see chart for details).  Clinical Course as of Apr 15 1026  Mon Apr 15, 2020  1019 Presence of a few white cells, otherwise normal  Wet prep, genital(!) [EW]  1019 Normal  CBC with Differential [EW]  1020 Normal except protein low  Comprehensive metabolic panel(!) [EW]  1020 Pending  GC/Chlamydia probe amp ()not at [facility] [EW]    Clinical Course User Index [EW] Mancel Bale, MD   MDM Rules/Calculators/A&P                       Patient Vitals for the past 24 hrs:  BP Temp Temp src Pulse Resp SpO2 Height Weight  04/15/20 0755 -- -- -- -- -- -- 5\' 8"  (1.727 m) 117.9 kg  04/15/20 0754 134/83 97.9 F (36.6 C) Oral 77 14 100 % -- --    10:27 AM Reevaluation with update and discussion. After  initial assessment and treatment, an updated evaluation reveals she is comfortable at this time has no further complaints.  Findings discussed and questions answered. 04/17/20   Medical Decision Making:  This patient is presenting for evaluation of soreness of the perineum, which does require a range of treatment options, and is a complaint that involves a moderate risk of morbidity and mortality. The differential diagnoses include abscess, vaginal infection, pelvic infection perineal infection. I decided to review old records, and in summary healthy young adult, with concern for perineal process..  I did not require additional historical information from anyone.  Clinical Laboratory Tests Ordered, included CBC, Metabolic panel and Wet prep, RPR, HIV. Review indicates reassuring findings without acute abnormalities..   Critical Interventions-clinical evaluation, laboratory testing, observation reassessment   After These Interventions, the Patient was reevaluated and was found stable for discharge.  Perineal swelling, adjacent to vagina, nonspecific appearance, not clearly abscess or infected Bartholin cyst.  Skin changes, associated with this subcutaneous process, or not clear but could be related to condylomata versus scar.  I have some concern for infection, without clear abscess or suspected deep tissue infection.  Skin lesion, appearing like a wart, may require biopsy.  Patient has short-term follow-up scheduled with her gynecologist.  No occasional hospitalization or imaging, at this time.  CRITICAL CARE-no Performed by: Mancel Bale  Nursing Notes Reviewed/ Care Coordinated Applicable Imaging Reviewed Interpretation of Laboratory Data incorporated into ED treatment  The patient appears reasonably screened and/or stabilized for discharge and I doubt any other medical condition or other [facility] requiring further screening, evaluation, or treatment in the ED at this time prior to  discharge.  Plan: Home Medications-usual; Home Treatments-warm tub soaks, to 3 times a day until reevaluated by gynecology; return here if the recommended treatment, does not improve the symptoms; Recommended follow up-gynecology in 3 days as scheduled.       Final Clinical Impression(s) / ED Diagnoses Final diagnoses:  Vaginal pain    Rx / DC Orders ED Discharge Orders         Ordered    amoxicillin-clavulanate (AUGMENTIN) 875-125 MG tablet  2 times daily     04/15/20 1025           Mancel Bale, MD 04/15/20 1027

## 2020-04-16 LAB — GC/CHLAMYDIA PROBE AMP (~~LOC~~) NOT AT ARMC
Chlamydia: NEGATIVE
Comment: NEGATIVE
Comment: NORMAL
Neisseria Gonorrhea: NEGATIVE

## 2020-04-18 ENCOUNTER — Other Ambulatory Visit: Payer: Self-pay

## 2020-04-18 ENCOUNTER — Ambulatory Visit (INDEPENDENT_AMBULATORY_CARE_PROVIDER_SITE_OTHER): Payer: Medicaid Other | Admitting: Family Medicine

## 2020-04-18 ENCOUNTER — Encounter: Payer: Self-pay | Admitting: Family Medicine

## 2020-04-18 VITALS — BP 123/82 | HR 74 | Ht 68.0 in | Wt 264.0 lb

## 2020-04-18 DIAGNOSIS — Z30432 Encounter for removal of intrauterine contraceptive device: Secondary | ICD-10-CM

## 2020-04-18 DIAGNOSIS — N750 Cyst of Bartholin's gland: Secondary | ICD-10-CM | POA: Diagnosis not present

## 2020-04-18 DIAGNOSIS — Z30011 Encounter for initial prescription of contraceptive pills: Secondary | ICD-10-CM

## 2020-04-18 MED ORDER — NORGESTIMATE-ETH ESTRADIOL 0.25-35 MG-MCG PO TABS: 1.0000 | ORAL_TABLET | Freq: Every day | ORAL | 11 refills | Status: DC

## 2020-04-18 NOTE — Progress Notes (Signed)
IUD REMOVAL    IUD Removal Procedure Note   Patient is 35 y.o. [id number] who is here for Liletta IUD removal. She would like IUD removed, as she is due to get it removed and she is questioning whether or not she wants to get pregnant. She has had no issues with IUD. She understands that she could get pregnant after removal of IUD if she does not use another form of contraception.  Reviewed risks of removal including pain, bleeding, difficult removal and inability to remove IUD which may require hysteroscopic removal in OR. She affirms that she would like IUD removed.  BP 123/82   Pulse 74   Ht 5\' 8"  (1.727 m)   Wt 264 lb (119.7 kg)   LMP 03/26/2020 (Approximate)   BMI 40.14 kg/m   Patient with normal appearing external female genitalia. Graves speculum placed in vagina and  IUD strings easily visualized. Strings grasped with straight Kelly forceps and removed easily. Minimal bleeding noted. All instruments removed from vagina. Patient tolerated procedure very well.   She was given post removal instructions. She is planning on using Sprintec for contraception.   Return as scheduled.  05/26/2020, DO OB Fellow, [facility] 04/18/2020 2:08 PM

## 2020-04-18 NOTE — Progress Notes (Signed)
GYN presents for FU.  Bartholin Cyst ruptured yesterday. Denies pain and swelling. Her PCP noticed something growing and recommends that her OB/GYN looks at it.

## 2020-04-18 NOTE — Patient Instructions (Signed)
Bartholin's Cyst  A Bartholin's cyst is a fluid-filled sac that forms on a Bartholin's gland. Bartholin's glands are small glands in the folds of skin around the opening of the vagina (labia). This type of cyst causes a bulge or lump near the lower opening of the vagina. If you have a cyst that is small and not infected, you may be able to take care of it at home. If your cyst gets infected, it may cause pain and your doctor may need to drain it. An infected Bartholin's cyst is called a Bartholin's abscess. Follow these instructions at home: Medicines  Take over-the-counter and prescription medicines only as told by your doctor.  If you were prescribed an antibiotic medicine, take it as told by your doctor. Do not stop taking the antibiotic even if you start to feel better. Managing pain and swelling  Try sitz baths to help with pain and swelling. A sitz bath is a warm water bath in which the water only comes up to your hips and should cover your buttocks. You may take sitz baths a few times a day.  Put heat on the affected area as often as needed. Use the heat source that your doctor recommends, such as a moist heat pack or a heating pad. ? Place a towel between your skin and the heat source. ? Leave the heat on for 20-30 minutes. ? Remove the heat if your skin turns bright red. This is especially important if you cannot feel pain, heat, or cold. You may have a greater risk of getting burned. General instructions  If your cyst or abscess was drained: ? Follow instructions from your doctor about how to take care of your wound. ? Use feminine pads to absorb any fluid.  Do not push on or squeeze your cyst.  Do not have sex until the cyst has gone away or your wound from drainage has healed.  Take these steps to help prevent a Bartholin's cyst from returning, and to prevent other Bartholin's cysts from forming: ? Take a bath or shower once a day. Clean your vaginal area with mild soap and  water when you bathe. ? Practice safe sex to prevent STIs (sexually transmitted infections). Talk with your doctor about how to prevent STIs and which forms of birth control (contraception) may be best for you.  Keep all follow-up visits as told by your doctor. This is important. Contact a doctor if:  You have a fever.  You get redness, swelling, or pain around your cyst.  You have fluid, blood, pus, or a bad smell coming from your cyst.  You have a cyst that gets larger or comes back. Summary  A Bartholin's cyst is a fluid-filled sac that forms on a Bartholin's gland. These small glands are found in the folds of skin around the opening of the vagina (labia).  This type of cyst causes a bulge or lump near the lower opening of the vagina. An infected Bartholin's cyst is called a Bartholin's abscess.  Try sitz baths a few times a day to help with pain and swelling.  Do not push on or squeeze your cyst. This information is not intended to replace advice given to you by your health care provider. Make sure you discuss any questions you have with your health care provider. Document Revised: 08/25/2018 Document Reviewed: 08/04/2017 Elsevier Patient Education  2020 [employer]  

## 2020-04-18 NOTE — Progress Notes (Signed)
GYNECOLOGY OFFICE NOTE  History:  34 y.o. [id number] here today for ED follow up.  She was seen in the ED on 5/31 with complaints of sore lower vaginal area for a week and a half, which was thought to be non-specific. She was sent home with Augmentin and instructions for warm soaks.  Today she reports that the cyst she had ruptured the evening of 04/16/20, as she woke up with a lot of purulent and foul-smelling discharge. She reports using an entire 100-pack of wet wipes. The area is no longer sore or painful- she reports that before it ruptured it felt like a lymph node and was exquisitely painful.  Past Medical History:  Diagnosis Date   Asthma    Depression    not current was 10 years ago   Polycystic ovarian syndrome     Past Surgical History:  Procedure Laterality Date   FOOT SURGERY     NO PAST SURGERIES     WISDOM TOOTH EXTRACTION       Current Outpatient Medications:    albuterol (PROVENTIL HFA;VENTOLIN HFA) 108 (90 Base) MCG/ACT inhaler, Inhale 2 puffs into the lungs every 4 (four) hours as needed for wheezing or shortness of breath., Disp: , Rfl:    amoxicillin-clavulanate (AUGMENTIN) 875-125 MG tablet, Take 1 tablet by mouth 2 (two) times daily. One po bid x 7 days, Disp: 14 tablet, Rfl: 0   ergocalciferol (VITAMIN D2) 50000 units capsule, Take 50,000 Units by mouth once a week. Wednesdays, Disp: , Rfl:    ibuprofen (ADVIL,MOTRIN) 200 MG tablet, Take 600-800 mg by mouth every 8 (eight) hours as needed for mild pain or moderate pain., Disp: , Rfl:    levonorgestrel (MIRENA) 20 MCG/24HR IUD, 1 each by Intrauterine route continuous., Disp: , Rfl:    methocarbamol (ROBAXIN) 500 MG tablet, Take 1 tablet (500 mg total) by mouth 2 (two) times daily as needed for muscle spasms. (Patient not taking: Reported on 06/26/2019), Disp: 20 tablet, Rfl: 0   norgestimate-ethinyl estradiol (ORTHO-CYCLEN) 0.25-35 MG-MCG tablet, Take 1 tablet by mouth daily., Disp: 1 Package, Rfl:  11   predniSONE (STERAPRED UNI-PAK 21 TAB) 10 MG (21) TBPK tablet, Take by mouth daily. Take 6 tabs by mouth daily  for 2 days, then 5 tabs for 2 days, then 4 tabs for 2 days, then 3 tabs for 2 days, 2 tabs for 2 days, then 1 tab by mouth daily for 2 days (Patient not taking: Reported on 04/18/2020), Disp: 42 tablet, Rfl: 0  The following portions of the patient's history were reviewed and updated as appropriate: allergies, current medications, past family history, past medical history, past social history, past surgical history and problem list.   Review of Systems:  Pertinent items noted in HPI and remainder of comprehensive ROS otherwise negative.   Objective:  Physical Exam BP 123/82    Pulse 74    Ht 5\' 8"  (1.727 m)    Wt 264 lb (119.7 kg)    LMP 03/26/2020 (Approximate)    BMI 40.14 kg/m  CONSTITUTIONAL: Well-developed, well-nourished female in no acute distress.  HENT:  Normocephalic, atraumatic. External right and left ear normal. Oropharynx is clear and moist EYES: Conjunctivae and EOM are normal. Pupils are equal, round, and reactive to light. No scleral icterus.  NECK: Normal range of motion, supple, no masses SKIN: Skin is warm and dry. No rash noted. Not diaphoretic. No erythema. No pallor. NEUROLOGIC: Alert and oriented to person, place, and time. Normal reflexes, muscle  tone coordination. No cranial nerve deficit noted. PSYCHIATRIC: Normal mood and affect. Normal behavior. Normal judgment and thought content. CARDIOVASCULAR: Normal heart rate noted RESPIRATORY: Effort and breath sounds normal, no problems with respiration noted ABDOMEN: Soft, no distention noted.   PELVIC: Normal appearing external genitalia- few skin tags present; normal appearing vaginal mucosa and cervix, with a 3 mm area of ulceration consistent with a ruptured Bartholin cyst present at 7 o'clock.  Green purulent discharge noted, oozing from ulceration.  IUD strings easily seen, and IUD removed per patient  request- see procedure note for details. Normal uterine size, no other palpable masses, no uterine or adnexal tenderness. MUSCULOSKELETAL: Normal range of motion. No edema noted.  Exam done with chaperone present.  Labs and Imaging No results found.  Assessment & Plan:  1. Encounter for initial prescription of contraceptive pills Patient has been counseled on birth control options today. We discussed risks and benefits of each available contraception, including but not limited to: IUD, Nexplanon, Depo Shot, Nuvaring, OCPs, and condoms/diaphragms. Counseled on risk for STDs not reduced by birth control use.  The patient has been counseled on the proper usage, side effects and potential interactions of the new medication. - norgestimate-ethinyl estradiol (ORTHO-CYCLEN) 0.25-35 MG-MCG tablet; Take 1 tablet by mouth daily.  Dispense: 1 Package; Refill: 11  2. Encounter for removal of intrauterine contraceptive device (IUD) - IUD removed per patient request - Has appointment on 6/29 for re-insertion, would like OCPs until her appointment as she is not sure whether or not she wants to conceive - Advised that OCPs effectiveness may be affected by antibiotics, and that she should use a back up method while taking Augmentin  3. Infected cyst of Bartholin's gland duct - S/p rupture - Continue augmentin - Can recheck at 6/29 appointment  Routine preventative health maintenance measures emphasized. Please refer to After Visit Summary for other counseling recommendations.   Return for as scheduled on 6/29 for IUD insertion/Bartholin cyst follow up.  Total face-to-face time with patient: 21 minutes. Over 50% of encounter was spent on counseling and coordination of care.  Merilyn Baba, DO OB Fellow, Faculty Practice 04/18/2020 3:06 PM

## 2020-05-14 ENCOUNTER — Ambulatory Visit: Payer: Medicaid Other

## 2020-05-28 ENCOUNTER — Emergency Department (HOSPITAL_COMMUNITY)
Admission: EM | Admit: 2020-05-28 | Discharge: 2020-05-28 | Discharge status: Against Medical Advice | Payer: Medicaid Other

## 2020-05-28 NOTE — ED Notes (Addendum)
Pt stated that she was leaving and would return tomorrow due to long wait

## 2020-06-26 ENCOUNTER — Other Ambulatory Visit: Payer: Self-pay

## 2020-06-26 ENCOUNTER — Ambulatory Visit (HOSPITAL_COMMUNITY)
Admission: EM | Admit: 2020-06-26 | Discharge: 2020-06-27 | Disposition: A | Discharge status: Home / Self Care | Payer: Medicaid Other | Attending: Family | Admitting: Family

## 2020-06-26 DIAGNOSIS — F909 Attention-deficit hyperactivity disorder, unspecified type: Secondary | ICD-10-CM | POA: Insufficient documentation

## 2020-06-26 DIAGNOSIS — F17210 Nicotine dependence, cigarettes, uncomplicated: Secondary | ICD-10-CM | POA: Insufficient documentation

## 2020-06-26 DIAGNOSIS — Z630 Problems in relationship with spouse or partner: Secondary | ICD-10-CM | POA: Insufficient documentation

## 2020-06-26 DIAGNOSIS — Z888 Allergy status to other drugs, medicaments and biological substances status: Secondary | ICD-10-CM | POA: Insufficient documentation

## 2020-06-26 DIAGNOSIS — J45909 Unspecified asthma, uncomplicated: Secondary | ICD-10-CM | POA: Insufficient documentation

## 2020-06-26 DIAGNOSIS — E282 Polycystic ovarian syndrome: Secondary | ICD-10-CM | POA: Insufficient documentation

## 2020-06-26 DIAGNOSIS — Z975 Presence of (intrauterine) contraceptive device: Secondary | ICD-10-CM | POA: Insufficient documentation

## 2020-06-26 DIAGNOSIS — Z7952 Long term (current) use of systemic steroids: Secondary | ICD-10-CM | POA: Insufficient documentation

## 2020-06-26 DIAGNOSIS — Z20822 Contact with and (suspected) exposure to covid-19: Secondary | ICD-10-CM | POA: Insufficient documentation

## 2020-06-26 DIAGNOSIS — F332 Major depressive disorder, recurrent severe without psychotic features: Secondary | ICD-10-CM | POA: Insufficient documentation

## 2020-06-26 DIAGNOSIS — Z79899 Other long term (current) drug therapy: Secondary | ICD-10-CM | POA: Insufficient documentation

## 2020-06-26 DIAGNOSIS — R45851 Suicidal ideations: Secondary | ICD-10-CM | POA: Insufficient documentation

## 2020-06-26 DIAGNOSIS — Z791 Long term (current) use of non-steroidal anti-inflammatories (NSAID): Secondary | ICD-10-CM | POA: Insufficient documentation

## 2020-06-26 DIAGNOSIS — Z885 Allergy status to narcotic agent status: Secondary | ICD-10-CM | POA: Insufficient documentation

## 2020-06-26 LAB — COMPREHENSIVE METABOLIC PANEL
ALT: 42 U/L (ref 0–44)
AST: 23 U/L (ref 15–41)
Albumin: 4.1 g/dL (ref 3.5–5.0)
Alkaline Phosphatase: 132 U/L — ABNORMAL HIGH (ref 38–126)
Anion gap: 12 (ref 5–15)
BUN: 9 mg/dL (ref 6–20)
CO2: 21 mmol/L — ABNORMAL LOW (ref 22–32)
Calcium: 9.7 mg/dL (ref 8.9–10.3)
Chloride: 103 mmol/L (ref 98–111)
Creatinine, Ser: 0.75 mg/dL (ref 0.44–1.00)
GFR calc Af Amer: >60 mL/min (ref >60)
GFR calc non Af Amer: >60 mL/min (ref >60)
Glucose, Bld: 98 mg/dL (ref 70–99)
Potassium: 4.3 mmol/L (ref 3.5–5.1)
Sodium: 136 mmol/L (ref 135–145)
Total Bilirubin: 0.8 mg/dL (ref 0.3–1.2)
Total Protein: 6.9 g/dL (ref 6.5–8.1)

## 2020-06-26 LAB — CBC WITH DIFFERENTIAL/PLATELET
Abs Immature Granulocytes: 0.03 10*3/uL (ref 0.00–0.07)
Basophils Absolute: 0 10*3/uL (ref 0.0–0.1)
Basophils Relative: 0 %
Eosinophils Absolute: 0.2 10*3/uL (ref 0.0–0.5)
Eosinophils Relative: 2 %
HCT: 47.3 % — ABNORMAL HIGH (ref 36.0–46.0)
Hemoglobin: 15.4 g/dL — ABNORMAL HIGH (ref 12.0–15.0)
Immature Granulocytes: 0 %
Lymphocytes Relative: 24 %
Lymphs Abs: 2.4 10*3/uL (ref 0.7–4.0)
MCH: 29.7 pg (ref 26.0–34.0)
MCHC: 32.6 g/dL (ref 30.0–36.0)
MCV: 91.3 fL (ref 80.0–100.0)
Monocytes Absolute: 0.4 10*3/uL (ref 0.1–1.0)
Monocytes Relative: 4 %
Neutro Abs: 6.9 10*3/uL (ref 1.7–7.7)
Neutrophils Relative %: 70 %
Platelets: 266 10*3/uL (ref 150–400)
RBC: 5.18 MIL/uL — ABNORMAL HIGH (ref 3.87–5.11)
RDW: 13.2 % (ref 11.5–15.5)
WBC: 10 10*3/uL (ref 4.0–10.5)
nRBC: 0 % (ref 0.0–0.2)

## 2020-06-26 LAB — POCT PREGNANCY, URINE: Preg Test, Ur: NEGATIVE

## 2020-06-26 LAB — LIPID PANEL
Cholesterol: 262 mg/dL — ABNORMAL HIGH (ref 0–200)
HDL: 46 mg/dL (ref >40)
LDL Cholesterol: 190 mg/dL — ABNORMAL HIGH (ref 0–99)
Total CHOL/HDL Ratio: 5.7 RATIO
Triglycerides: 131 mg/dL (ref <150)
VLDL: 26 mg/dL (ref 0–40)

## 2020-06-26 LAB — POC SARS CORONAVIRUS 2 AG: SARS Coronavirus 2 Ag: NEGATIVE

## 2020-06-26 LAB — TSH: TSH: 2.282 u[IU]/mL (ref 0.350–4.500)

## 2020-06-26 LAB — ETHANOL: Alcohol, Ethyl (B): <10 mg/dL (ref <10)

## 2020-06-26 LAB — SARS CORONAVIRUS 2 BY RT PCR (HOSPITAL ORDER, PERFORMED IN ~~LOC~~ HOSPITAL LAB): SARS Coronavirus 2: NEGATIVE

## 2020-06-26 LAB — HEMOGLOBIN A1C
Hgb A1c MFr Bld: 5.4 % (ref 4.8–5.6)
Mean Plasma Glucose: 108.28 mg/dL

## 2020-06-26 LAB — POCT URINE DRUG SCREEN - MANUAL ENTRY (I-SCREEN)
POC Amphetamine UR: NOT DETECTED
POC Buprenorphine (BUP): NOT DETECTED
POC Cocaine UR: POSITIVE — AB
POC Marijuana UR: POSITIVE — AB
POC Methadone UR: NOT DETECTED
POC Methamphetamine UR: NOT DETECTED
POC Morphine: NOT DETECTED
POC Oxazepam (BZO): NOT DETECTED
POC Oxycodone UR: NOT DETECTED
POC Secobarbital (BAR): NOT DETECTED

## 2020-06-26 LAB — MAGNESIUM: Magnesium: 1.8 mg/dL (ref 1.7–2.4)

## 2020-06-26 MED ORDER — TRAZODONE HCL 50 MG PO TABS
50.0000 mg | ORAL_TABLET | Freq: Every evening | ORAL | Status: DC | PRN
  Administered 2020-06-26: 50 mg via ORAL
  Filled 2020-06-26: qty 1

## 2020-06-26 MED ORDER — NICOTINE 14 MG/24HR TD PT24
14.0000 mg | MEDICATED_PATCH | Freq: Once | TRANSDERMAL | Status: DC
  Administered 2020-06-26: 14 mg via TRANSDERMAL
  Filled 2020-06-26: qty 1

## 2020-06-26 MED ORDER — FLUOXETINE HCL 20 MG PO CAPS
20.0000 mg | ORAL_CAPSULE | Freq: Once | ORAL | Status: DC
  Filled 2020-06-26 (×2): qty 1

## 2020-06-26 MED ORDER — MAGNESIUM HYDROXIDE 400 MG/5ML PO SUSP: 30.0000 mL | Freq: Every day | ORAL | Status: DC | PRN

## 2020-06-26 MED ORDER — ALUM & MAG HYDROXIDE-SIMETH 200-200-20 MG/5ML PO SUSP: 30.0000 mL | ORAL | Status: DC | PRN

## 2020-06-26 MED ORDER — HYDROXYZINE HCL 25 MG PO TABS
25.0000 mg | ORAL_TABLET | Freq: Three times a day (TID) | ORAL | Status: DC | PRN
  Administered 2020-06-26: 25 mg via ORAL
  Filled 2020-06-26: qty 1
  Filled 2020-06-26: qty 10

## 2020-06-26 MED ORDER — METHOCARBAMOL 500 MG PO TABS
500.0000 mg | ORAL_TABLET | Freq: Two times a day (BID) | ORAL | Status: DC | PRN
  Administered 2020-06-26 – 2020-06-27 (×2): 500 mg via ORAL
  Filled 2020-06-26 (×2): qty 1

## 2020-06-26 NOTE — ED Notes (Signed)
Pt asked and receives hot packs. Pt is pacing and crying.

## 2020-06-26 NOTE — ED Notes (Signed)
Patient had no belongings in locker.
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
## 2020-06-26 NOTE — ED Notes (Signed)
Patient alert, oriented and ambulatory. Patient denies SI/HI at this time. Patient is tearful, and agitated and upset about having to stay on unit overnight. Patient states her relationship is stressful and she wants advice from someone as to what she should do. Patient upset with provider because she does not feel that she got the answer she wanted and she is being admitted overnight. Patient reassured and skin was assessed and was WNL. Patient and belongings searched and no contraband found. Patient escorted to [facility] by staff. Monitoring continues.
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
## 2020-06-27 LAB — PROLACTIN: Prolactin: 4.7 ng/mL — ABNORMAL LOW (ref 4.8–23.3)

## 2020-06-27 MED ORDER — FLUOXETINE HCL 20 MG PO CAPS
20.0000 mg | ORAL_CAPSULE | Freq: Every day | ORAL | Status: DC
  Administered 2020-06-27: 20 mg via ORAL
  Filled 2020-06-27: qty 7

## 2020-06-27 MED ORDER — FLUOXETINE HCL 20 MG PO CAPS: 20.0000 mg | ORAL_CAPSULE | Freq: Every day | ORAL | 0 refills | Status: DC

## 2020-06-27 MED ORDER — HYDROXYZINE HCL 25 MG PO TABS: 25.0000 mg | ORAL_TABLET | Freq: Three times a day (TID) | ORAL | 0 refills | Status: DC | PRN
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
## 2020-06-27 NOTE — ED Notes (Signed)
Patient discharged from services, received all belongings.

## 2020-06-27 NOTE — ED Notes (Signed)
Pt discharged in no acute distress. Denies SI/HI upon discharge. Verbalized understanding of all discharge instructions. All pt belongings returned from locker #11 intact. Safety maintained.

## 2020-06-27 NOTE — ED Notes (Signed)
Pt A&Ox4. Denies SI/HI. Cooperative with staff. Bkft offered. Pt drinking coffee. Requesting to leave. Safety maintained.

## 2020-06-27 NOTE — Discharge Instructions (Addendum)
Take medications as prescribed Keep scheduled appointments 

## 2020-07-11 ENCOUNTER — Encounter (HOSPITAL_COMMUNITY): Payer: Medicaid Other | Admitting: Psychiatry

## 2020-08-14 ENCOUNTER — Ambulatory Visit: Payer: Medicaid Other | Admitting: Nurse Practitioner

## 2020-09-10 ENCOUNTER — Other Ambulatory Visit: Payer: Self-pay

## 2020-09-10 ENCOUNTER — Inpatient Hospital Stay (HOSPITAL_COMMUNITY)
Admission: AD | Admit: 2020-09-10 | Discharge: 2020-09-10 | Discharge status: Against Medical Advice | Payer: Medicaid Other | Attending: Obstetrics & Gynecology | Admitting: Obstetrics & Gynecology

## 2020-09-10 DIAGNOSIS — Z5321 Procedure and treatment not carried out due to patient leaving prior to being seen by health care provider: Secondary | ICD-10-CM | POA: Insufficient documentation

## 2020-09-10 DIAGNOSIS — Z3200 Encounter for pregnancy test, result unknown: Secondary | ICD-10-CM | POA: Diagnosis present

## 2020-09-10 NOTE — MAU Note (Addendum)
Pt reports she took HPtT 2 days ago and it was positive. Started having abd cramping today Stated she lifted a 40lb box and then started having  pain. Denies any vag bleeding . Reports small amount of clear vag discharge. Pt unable to urinate and give a sample at [facility] time

## 2020-09-10 NOTE — MAU Note (Signed)
Pt signed AMA form at registration. Unable to stay for further evaluation.

## 2020-09-16 ENCOUNTER — Other Ambulatory Visit: Payer: Self-pay

## 2020-09-16 ENCOUNTER — Ambulatory Visit (INDEPENDENT_AMBULATORY_CARE_PROVIDER_SITE_OTHER): Payer: Medicaid Other

## 2020-09-16 DIAGNOSIS — Z3480 Encounter for supervision of other normal pregnancy, unspecified trimester: Secondary | ICD-10-CM | POA: Diagnosis not present

## 2020-09-16 DIAGNOSIS — N926 Irregular menstruation, unspecified: Secondary | ICD-10-CM

## 2020-09-16 LAB — POCT URINE PREGNANCY: Preg Test, Ur: POSITIVE — AB

## 2020-09-16 MED ORDER — GOJJI WEIGHT SCALE MISC: 1.0000 | 0 refills | Status: DC | PRN

## 2020-09-16 MED ORDER — BLOOD PRESSURE KIT DEVI: 1.0000 | 0 refills | Status: DC | PRN

## 2020-09-16 NOTE — Progress Notes (Signed)
Breanna [name] presents today for UPT. She has no unusual complaints. Pt questioned is Fluoxetine and Hydroxyzine safe to take during pregnancy.  LMP: 07/31/20     OBJECTIVE: Appears well, in no apparent distress.  OB History    Gravida  2   Para  2   Term  2   Preterm      AB      Living  2     SAB      TAB      Ectopic      Multiple      Live Births  2          Home UPT Result: Positive  In-Office UPT result: Positive  I have reviewed the patient's medical, obstetrical, social, and family histories, and medications.   ASSESSMENT: Positive pregnancy test  PLAN Prenatal care to be completed at:  [facility]  Pt may continue Fluoxetine and Hydroxyzine per Dr. Debroah Loop BP cuff and scale ordered

## 2020-09-27 ENCOUNTER — Inpatient Hospital Stay (HOSPITAL_COMMUNITY): Payer: Medicaid Other | Admitting: Certified Registered Nurse Anesthetist

## 2020-09-27 ENCOUNTER — Telehealth: Payer: Self-pay

## 2020-09-27 ENCOUNTER — Inpatient Hospital Stay (HOSPITAL_COMMUNITY): Payer: Medicaid Other

## 2020-09-27 ENCOUNTER — Encounter (HOSPITAL_COMMUNITY)
Admission: AD | Disposition: A | Discharge status: Home / Self Care | Payer: Self-pay | Source: Home / Self Care | Attending: Obstetrics and Gynecology

## 2020-09-27 ENCOUNTER — Encounter: Payer: Self-pay | Admitting: Obstetrics and Gynecology

## 2020-09-27 ENCOUNTER — Encounter (HOSPITAL_COMMUNITY): Payer: Self-pay | Admitting: Obstetrics and Gynecology

## 2020-09-27 ENCOUNTER — Other Ambulatory Visit: Payer: Self-pay

## 2020-09-27 ENCOUNTER — Inpatient Hospital Stay (HOSPITAL_COMMUNITY)
Admission: AD | Admit: 2020-09-27 | Discharge: 2020-09-27 | Disposition: A | Discharge status: Home / Self Care | Payer: Medicaid Other | Attending: Obstetrics and Gynecology | Admitting: Obstetrics and Gynecology

## 2020-09-27 DIAGNOSIS — Z79899 Other long term (current) drug therapy: Secondary | ICD-10-CM | POA: Diagnosis not present

## 2020-09-27 DIAGNOSIS — O209 Hemorrhage in early pregnancy, unspecified: Secondary | ICD-10-CM

## 2020-09-27 DIAGNOSIS — O4691 Antepartum hemorrhage, unspecified, first trimester: Secondary | ICD-10-CM

## 2020-09-27 DIAGNOSIS — O4690 Antepartum hemorrhage, unspecified, unspecified trimester: Secondary | ICD-10-CM

## 2020-09-27 DIAGNOSIS — Z3A08 8 weeks gestation of pregnancy: Secondary | ICD-10-CM

## 2020-09-27 DIAGNOSIS — O009 Unspecified ectopic pregnancy without intrauterine pregnancy: Secondary | ICD-10-CM

## 2020-09-27 DIAGNOSIS — F17210 Nicotine dependence, cigarettes, uncomplicated: Secondary | ICD-10-CM | POA: Diagnosis not present

## 2020-09-27 DIAGNOSIS — O00101 Right tubal pregnancy without intrauterine pregnancy: Secondary | ICD-10-CM | POA: Insufficient documentation

## 2020-09-27 DIAGNOSIS — O3680X0 Pregnancy with inconclusive fetal viability, not applicable or unspecified: Secondary | ICD-10-CM | POA: Diagnosis not present

## 2020-09-27 DIAGNOSIS — Z888 Allergy status to other drugs, medicaments and biological substances status: Secondary | ICD-10-CM | POA: Diagnosis not present

## 2020-09-27 DIAGNOSIS — Z3A00 Weeks of gestation of pregnancy not specified: Secondary | ICD-10-CM

## 2020-09-27 DIAGNOSIS — Z885 Allergy status to narcotic agent status: Secondary | ICD-10-CM | POA: Diagnosis not present

## 2020-09-27 DIAGNOSIS — O26891 Other specified pregnancy related conditions, first trimester: Secondary | ICD-10-CM | POA: Diagnosis not present

## 2020-09-27 DIAGNOSIS — Z20822 Contact with and (suspected) exposure to covid-19: Secondary | ICD-10-CM | POA: Insufficient documentation

## 2020-09-27 DIAGNOSIS — R102 Pelvic and perineal pain: Secondary | ICD-10-CM

## 2020-09-27 DIAGNOSIS — O99331 Smoking (tobacco) complicating pregnancy, first trimester: Secondary | ICD-10-CM | POA: Insufficient documentation

## 2020-09-27 HISTORY — PX: LAPAROSCOPIC UNILATERAL SALPINGECTOMY: SHX5934

## 2020-09-27 LAB — GC/CHLAMYDIA PROBE AMP (~~LOC~~) NOT AT ARMC
Chlamydia: NEGATIVE
Comment: NEGATIVE
Comment: NORMAL
Neisseria Gonorrhea: NEGATIVE

## 2020-09-27 LAB — COMPREHENSIVE METABOLIC PANEL
ALT: 27 U/L (ref 0–44)
AST: 22 U/L (ref 15–41)
Albumin: 3.6 g/dL (ref 3.5–5.0)
Alkaline Phosphatase: 97 U/L (ref 38–126)
Anion gap: 9 (ref 5–15)
BUN: 6 mg/dL (ref 6–20)
CO2: 20 mmol/L — ABNORMAL LOW (ref 22–32)
Calcium: 9 mg/dL (ref 8.9–10.3)
Chloride: 109 mmol/L (ref 98–111)
Creatinine, Ser: 0.72 mg/dL (ref 0.44–1.00)
GFR, Estimated: >60 mL/min (ref >60)
Glucose, Bld: 125 mg/dL — ABNORMAL HIGH (ref 70–99)
Potassium: 3.3 mmol/L — ABNORMAL LOW (ref 3.5–5.1)
Sodium: 138 mmol/L (ref 135–145)
Total Bilirubin: 0.7 mg/dL (ref 0.3–1.2)
Total Protein: 6.3 g/dL — ABNORMAL LOW (ref 6.5–8.1)

## 2020-09-27 LAB — CBC
HCT: 39 % (ref 36.0–46.0)
Hemoglobin: 13 g/dL (ref 12.0–15.0)
MCH: 30.4 pg (ref 26.0–34.0)
MCHC: 33.3 g/dL (ref 30.0–36.0)
MCV: 91.1 fL (ref 80.0–100.0)
Platelets: 260 10*3/uL (ref 150–400)
RBC: 4.28 MIL/uL (ref 3.87–5.11)
RDW: 13.3 % (ref 11.5–15.5)
WBC: 8 10*3/uL (ref 4.0–10.5)
nRBC: 0 % (ref 0.0–0.2)

## 2020-09-27 LAB — RESPIRATORY PANEL BY RT PCR (FLU A&B, COVID)
Influenza A by PCR: NEGATIVE
Influenza B by PCR: NEGATIVE
SARS Coronavirus 2 by RT PCR: NEGATIVE

## 2020-09-27 LAB — WET PREP, GENITAL
Clue Cells Wet Prep HPF POC: NONE SEEN
Sperm: NONE SEEN
Trich, Wet Prep: NONE SEEN
Yeast Wet Prep HPF POC: NONE SEEN

## 2020-09-27 LAB — HCG, QUANTITATIVE, PREGNANCY: hCG, Beta Chain, Quant, S: 310 m[IU]/mL — ABNORMAL HIGH (ref <5)

## 2020-09-27 LAB — TYPE AND SCREEN
ABO/RH(D): O POS
Antibody Screen: NEGATIVE

## 2020-09-27 SURGERY — SALPINGECTOMY, UNILATERAL, LAPAROSCOPIC
Anesthesia: General | Site: Abdomen | Laterality: Right

## 2020-09-27 MED ORDER — DEXAMETHASONE SODIUM PHOSPHATE 10 MG/ML IJ SOLN
INTRAMUSCULAR | Status: DC | PRN
  Administered 2020-09-27: 4 mg via INTRAVENOUS

## 2020-09-27 MED ORDER — GABAPENTIN 300 MG PO CAPS
300.0000 mg | ORAL_CAPSULE | ORAL | Status: AC
  Administered 2020-09-27: 300 mg via ORAL
  Filled 2020-09-27: qty 1

## 2020-09-27 MED ORDER — LACTATED RINGERS IV SOLN: INTRAVENOUS | Status: DC

## 2020-09-27 MED ORDER — IBUPROFEN 800 MG PO TABS: 800.0000 mg | ORAL_TABLET | Freq: Three times a day (TID) | ORAL | 0 refills | Status: DC | PRN

## 2020-09-27 MED ORDER — PROPOFOL 10 MG/ML IV BOLUS
INTRAVENOUS | Status: AC
  Filled 2020-09-27: qty 20

## 2020-09-27 MED ORDER — ORAL CARE MOUTH RINSE: 15.0000 mL | Freq: Once | OROMUCOSAL | Status: AC

## 2020-09-27 MED ORDER — KETOROLAC TROMETHAMINE 30 MG/ML IJ SOLN
INTRAMUSCULAR | Status: DC | PRN
  Administered 2020-09-27: 30 mg via INTRAVENOUS

## 2020-09-27 MED ORDER — ACETAMINOPHEN 500 MG PO TABS: 1000.0000 mg | ORAL_TABLET | Freq: Once | ORAL | Status: DC

## 2020-09-27 MED ORDER — SUCCINYLCHOLINE CHLORIDE 200 MG/10ML IV SOSY
PREFILLED_SYRINGE | INTRAVENOUS | Status: DC | PRN
  Administered 2020-09-27: 120 mg via INTRAVENOUS

## 2020-09-27 MED ORDER — SOD CITRATE-CITRIC ACID 500-334 MG/5ML PO SOLN
30.0000 mL | ORAL | Status: AC
  Administered 2020-09-27: 30 mL via ORAL
  Filled 2020-09-27: qty 15

## 2020-09-27 MED ORDER — FENTANYL CITRATE (PF) 250 MCG/5ML IJ SOLN
INTRAMUSCULAR | Status: AC
  Filled 2020-09-27: qty 5

## 2020-09-27 MED ORDER — LIDOCAINE 2% (20 MG/ML) 5 ML SYRINGE
INTRAMUSCULAR | Status: DC | PRN
  Administered 2020-09-27: 60 mg via INTRAVENOUS

## 2020-09-27 MED ORDER — HYDROMORPHONE HCL 1 MG/ML IJ SOLN: 1.0000 mg | Freq: Once | INTRAMUSCULAR | Status: DC

## 2020-09-27 MED ORDER — FENTANYL CITRATE (PF) 100 MCG/2ML IJ SOLN
25.0000 ug | INTRAMUSCULAR | Status: DC | PRN
  Administered 2020-09-27: 50 ug via INTRAVENOUS

## 2020-09-27 MED ORDER — CEFAZOLIN SODIUM-DEXTROSE 2-4 GM/100ML-% IV SOLN
2.0000 g | INTRAVENOUS | Status: AC
  Administered 2020-09-27: 2 g via INTRAVENOUS
  Filled 2020-09-27: qty 100

## 2020-09-27 MED ORDER — ACETAMINOPHEN 10 MG/ML IV SOLN
INTRAVENOUS | Status: AC
  Filled 2020-09-27: qty 100

## 2020-09-27 MED ORDER — SODIUM CHLORIDE 0.9 % IR SOLN
Status: DC | PRN
  Administered 2020-09-27: 3000 mL

## 2020-09-27 MED ORDER — FENTANYL CITRATE (PF) 100 MCG/2ML IJ SOLN: 25.0000 ug | INTRAMUSCULAR | Status: DC | PRN

## 2020-09-27 MED ORDER — ONDANSETRON HCL 4 MG/2ML IJ SOLN
INTRAMUSCULAR | Status: DC | PRN
  Administered 2020-09-27: 4 mg via INTRAVENOUS

## 2020-09-27 MED ORDER — FENTANYL CITRATE (PF) 100 MCG/2ML IJ SOLN
INTRAMUSCULAR | Status: DC | PRN
  Administered 2020-09-27 (×2): 50 ug via INTRAVENOUS
  Administered 2020-09-27: 150 ug via INTRAVENOUS

## 2020-09-27 MED ORDER — HYDROMORPHONE HCL 2 MG PO TABS
2.0000 mg | ORAL_TABLET | Freq: Once | ORAL | Status: AC
  Administered 2020-09-27: 2 mg via ORAL

## 2020-09-27 MED ORDER — KETOROLAC TROMETHAMINE 30 MG/ML IJ SOLN
INTRAMUSCULAR | Status: AC
  Filled 2020-09-27: qty 1

## 2020-09-27 MED ORDER — DEXMEDETOMIDINE (PRECEDEX) IN NS 20 MCG/5ML (4 MCG/ML) IV SYRINGE
PREFILLED_SYRINGE | INTRAVENOUS | Status: DC | PRN
  Administered 2020-09-27: 20 ug via INTRAVENOUS

## 2020-09-27 MED ORDER — ACETAMINOPHEN 10 MG/ML IV SOLN
INTRAVENOUS | Status: DC | PRN
  Administered 2020-09-27: 1000 mg via INTRAVENOUS

## 2020-09-27 MED ORDER — AMISULPRIDE (ANTIEMETIC) 5 MG/2ML IV SOLN: 10.0000 mg | Freq: Once | INTRAVENOUS | Status: DC | PRN

## 2020-09-27 MED ORDER — FENTANYL CITRATE (PF) 100 MCG/2ML IJ SOLN
INTRAMUSCULAR | Status: AC
  Administered 2020-09-27: 50 ug via INTRAVENOUS
  Filled 2020-09-27: qty 2

## 2020-09-27 MED ORDER — PROPOFOL 10 MG/ML IV BOLUS
INTRAVENOUS | Status: DC | PRN
  Administered 2020-09-27: 200 mg via INTRAVENOUS

## 2020-09-27 MED ORDER — ROCURONIUM BROMIDE 10 MG/ML (PF) SYRINGE
PREFILLED_SYRINGE | INTRAVENOUS | Status: DC | PRN
  Administered 2020-09-27: 60 mg via INTRAVENOUS
  Administered 2020-09-27: 20 mg via INTRAVENOUS

## 2020-09-27 MED ORDER — HYDROMORPHONE HCL 2 MG PO TABS
ORAL_TABLET | ORAL | Status: AC
  Filled 2020-09-27: qty 1

## 2020-09-27 MED ORDER — HYDROMORPHONE HCL 1 MG/ML IJ SOLN: 1.0000 mg | Freq: Once | INTRAMUSCULAR | Status: AC

## 2020-09-27 MED ORDER — MIDAZOLAM HCL 2 MG/2ML IJ SOLN
INTRAMUSCULAR | Status: AC
  Filled 2020-09-27: qty 2

## 2020-09-27 MED ORDER — HYDROMORPHONE HCL 2 MG PO TABS: 2.0000 mg | ORAL_TABLET | ORAL | 0 refills | Status: DC | PRN

## 2020-09-27 MED ORDER — CHLORHEXIDINE GLUCONATE 0.12 % MT SOLN
15.0000 mL | Freq: Once | OROMUCOSAL | Status: AC
  Administered 2020-09-27: 15 mL via OROMUCOSAL

## 2020-09-27 MED ORDER — PHENYLEPHRINE 40 MCG/ML (10ML) SYRINGE FOR IV PUSH (FOR BLOOD PRESSURE SUPPORT)
PREFILLED_SYRINGE | INTRAVENOUS | Status: AC
  Filled 2020-09-27: qty 20

## 2020-09-27 MED ORDER — BUPIVACAINE HCL (PF) 0.25 % IJ SOLN
INTRAMUSCULAR | Status: AC
  Filled 2020-09-27: qty 30

## 2020-09-27 MED ORDER — MIDAZOLAM HCL 5 MG/5ML IJ SOLN
INTRAMUSCULAR | Status: DC | PRN
  Administered 2020-09-27: 2 mg via INTRAVENOUS

## 2020-09-27 MED ORDER — HYDROMORPHONE HCL 1 MG/ML IJ SOLN
INTRAMUSCULAR | Status: AC
  Administered 2020-09-27: 1 mg via INTRAVENOUS
  Filled 2020-09-27: qty 1

## 2020-09-27 SURGICAL SUPPLY — 73 items
ADH SKN CLS APL DERMABOND .7 (GAUZE/BANDAGES/DRESSINGS) ×3
APL SKNCLS STERI-STRIP NONHPOA (GAUZE/BANDAGES/DRESSINGS) ×3
APL SRG 38 LTWT LNG FL B (MISCELLANEOUS)
APPLICATOR ARISTA FLEXITIP XL (MISCELLANEOUS) IMPLANT
BAG SPEC RTRVL LRG 6X4 10 (ENDOMECHANICALS)
BARRIER ADHS 3X4 INTERCEED (GAUZE/BANDAGES/DRESSINGS) IMPLANT
BENZOIN TINCTURE PRP APPL 2/3 (GAUZE/BANDAGES/DRESSINGS) ×5 IMPLANT
BRR ADH 4X3 ABS CNTRL BYND (GAUZE/BANDAGES/DRESSINGS)
CANISTER SUCT 3000ML PPV (MISCELLANEOUS) ×5 IMPLANT
CLOSURE WOUND 1/2 X4 (GAUZE/BANDAGES/DRESSINGS) ×1
COVER WAND RF STERILE (DRAPES) ×5 IMPLANT
DECANTER SPIKE VIAL GLASS SM (MISCELLANEOUS) IMPLANT
DERMABOND ADVANCED (GAUZE/BANDAGES/DRESSINGS) ×2
DERMABOND ADVANCED .7 DNX12 (GAUZE/BANDAGES/DRESSINGS) ×3 IMPLANT
DRAPE WARM FLUID 44X44 (DRAPES) IMPLANT
DRSG OPSITE POSTOP 3X4 (GAUZE/BANDAGES/DRESSINGS) ×5 IMPLANT
DRSG OPSITE POSTOP 4X10 (GAUZE/BANDAGES/DRESSINGS) ×5 IMPLANT
DURAPREP 26ML APPLICATOR (WOUND CARE) ×5 IMPLANT
GAUZE SPONGE 4X4 16PLY XRAY LF (GAUZE/BANDAGES/DRESSINGS) ×5 IMPLANT
GLOVE BIO SURGEON STRL SZ7.5 (GLOVE) ×10 IMPLANT
GLOVE BIOGEL PI IND STRL 7.0 (GLOVE) ×6 IMPLANT
GLOVE BIOGEL PI IND STRL 8 (GLOVE) ×3 IMPLANT
GLOVE BIOGEL PI INDICATOR 7.0 (GLOVE) ×4
GLOVE BIOGEL PI INDICATOR 8 (GLOVE) ×2
GOWN STRL REUS W/ TWL LRG LVL3 (GOWN DISPOSABLE) ×6 IMPLANT
GOWN STRL REUS W/ TWL XL LVL3 (GOWN DISPOSABLE) ×3 IMPLANT
GOWN STRL REUS W/TWL LRG LVL3 (GOWN DISPOSABLE) ×10
GOWN STRL REUS W/TWL XL LVL3 (GOWN DISPOSABLE) ×5
HEMOSTAT ARISTA ABSORB 3G PWDR (HEMOSTASIS) IMPLANT
KIT TURNOVER KIT B (KITS) ×5 IMPLANT
NDL INSUFF ACCESS 14 VERSASTEP (NEEDLE) IMPLANT
NEEDLE HYPO 22GX1.5 SAFETY (NEEDLE) ×5 IMPLANT
NS IRRIG 1000ML POUR BTL (IV SOLUTION) ×5 IMPLANT
PACK ABDOMINAL GYN (CUSTOM PROCEDURE TRAY) ×5 IMPLANT
PACK LAPAROSCOPY BASIN (CUSTOM PROCEDURE TRAY) ×5 IMPLANT
PACK TRENDGUARD 450 HYBRID PRO (MISCELLANEOUS) ×3 IMPLANT
PAD OB MATERNITY 4.3X12.25 (PERSONAL CARE ITEMS) ×5 IMPLANT
POUCH SPECIMEN RETRIEVAL 10MM (ENDOMECHANICALS) IMPLANT
PROTECTOR NERVE ULNAR (MISCELLANEOUS) ×10 IMPLANT
RTRCTR C-SECT PINK 25CM LRG (MISCELLANEOUS) IMPLANT
SET IRRIG TUBING LAPAROSCOPIC (IRRIGATION / IRRIGATOR) ×4 IMPLANT
SET TUBE SMOKE EVAC HIGH FLOW (TUBING) ×5 IMPLANT
SHEARS HARMONIC ACE PLUS 36CM (ENDOMECHANICALS) ×4 IMPLANT
SLEEVE XCEL OPT CAN 5 100 (ENDOMECHANICALS) ×5 IMPLANT
SPONGE INTESTINAL PEANUT (DISPOSABLE) IMPLANT
SPONGE LAP 18X18 RF (DISPOSABLE) ×10 IMPLANT
STRIP CLOSURE SKIN 1/2X4 (GAUZE/BANDAGES/DRESSINGS) ×4 IMPLANT
SUT MNCRL AB 4-0 PS2 18 (SUTURE) ×5 IMPLANT
SUT PLAIN 2 0 (SUTURE) ×5
SUT PLAIN ABS 2-0 CT1 27XMFL (SUTURE) ×3 IMPLANT
SUT VIC AB 0 CT1 18XCR BRD8 (SUTURE) IMPLANT
SUT VIC AB 0 CT1 27 (SUTURE) ×10
SUT VIC AB 0 CT1 27XBRD ANBCTR (SUTURE) ×6 IMPLANT
SUT VIC AB 0 CT1 8-18 (SUTURE)
SUT VIC AB 1 CT1 18XBRD ANBCTR (SUTURE) IMPLANT
SUT VIC AB 1 CT1 36 (SUTURE) IMPLANT
SUT VIC AB 1 CT1 8-18 (SUTURE)
SUT VIC AB 3-0 CT1 27 (SUTURE) ×5
SUT VIC AB 3-0 CT1 TAPERPNT 27 (SUTURE) ×3 IMPLANT
SUT VIC AB 3-0 SH 27 (SUTURE)
SUT VIC AB 3-0 SH 27X BRD (SUTURE) IMPLANT
SUT VIC AB 4-0 KS 27 (SUTURE) ×5 IMPLANT
SUT VICRYL 0 UR6 27IN ABS (SUTURE) ×5 IMPLANT
SUT VICRYL 1 TIES 12X18 (SUTURE) ×5 IMPLANT
SYR CONTROL 10ML LL (SYRINGE) ×5 IMPLANT
TOWEL GREEN STERILE FF (TOWEL DISPOSABLE) ×10 IMPLANT
TRAY FOLEY W/BAG SLVR 14FR (SET/KITS/TRAYS/PACK) ×5 IMPLANT
TRENDGUARD 450 HYBRID PRO PACK (MISCELLANEOUS) ×5
TROCAR BALLN 12MMX100 BLUNT (TROCAR) ×17 IMPLANT
TROCAR VERSASTEP PLUS 12MM (TROCAR) IMPLANT
TROCAR VERSASTEP PLUS 5MM (TROCAR) IMPLANT
TROCAR XCEL NON-BLD 5MMX100MML (ENDOMECHANICALS) ×5 IMPLANT
WARMER LAPAROSCOPE (MISCELLANEOUS) ×5 IMPLANT

## 2020-09-27 NOTE — Anesthesia Preprocedure Evaluation (Signed)
Anesthesia Evaluation  Patient identified by MRN, date of birth, ID band Patient awake    Reviewed: Allergy & Precautions, NPO status , Patient's Chart, lab work & pertinent test results  Airway Mallampati: II  TM Distance: >3 FB Neck ROM: Full    Dental  (+) Dental Advisory Given   Pulmonary asthma , Current Smoker,    breath sounds clear to auscultation       Cardiovascular negative cardio ROS   Rhythm:Regular Rate:Normal     Neuro/Psych negative neurological ROS     GI/Hepatic negative GI ROS, Neg liver ROS,   Endo/Other  negative endocrine ROS  Renal/GU negative Renal ROS     Musculoskeletal   Abdominal   Peds  Hematology negative hematology ROS (+)   Anesthesia Other Findings   Reproductive/Obstetrics Ectopic pregnancy                             Lab Results  Component Value Date   WBC 8.0 09/27/2020   HGB 13.0 09/27/2020   HCT 39.0 09/27/2020   MCV 91.1 09/27/2020   PLT 260 09/27/2020   Lab Results  Component Value Date   CREATININE 0.72 09/27/2020   BUN 6 09/27/2020   NA 138 09/27/2020   K 3.3 (L) 09/27/2020   CL 109 09/27/2020   CO2 20 (L) 09/27/2020    Anesthesia Physical Anesthesia Plan  ASA: II and emergent  Anesthesia Plan: General   Post-op Pain Management:    Induction: Intravenous and Rapid sequence  PONV Risk Score and Plan: 3 and Dexamethasone, Ondansetron, Treatment may vary due to age or medical condition and Midazolam  Airway Management Planned: Oral ETT  Additional Equipment:   Intra-op Plan:   Post-operative Plan: Extubation in OR  Informed Consent: I have reviewed the patients History and Physical, chart, labs and discussed the procedure including the risks, benefits and alternatives for the proposed anesthesia with the patient or authorized representative who has indicated his/her understanding and acceptance.     Dental advisory  given  Plan Discussed with: CRNA  Anesthesia Plan Comments:         Anesthesia Quick Evaluation

## 2020-09-27 NOTE — Anesthesia Postprocedure Evaluation (Signed)
Anesthesia Post Note  Patient: Breanna [name]  Procedure(s) Performed: LAPAROSCOPIC RIGHT SALPINO-OOPHORECTOMY WITH REMOVAL OF ECTOPIC PREGNANCY (Right Abdomen)     Patient location during evaluation: PACU Anesthesia Type: General Level of consciousness: awake and alert Pain management: pain level controlled Vital Signs Assessment: post-procedure vital signs reviewed and stable Respiratory status: spontaneous breathing, nonlabored ventilation, respiratory function stable and patient connected to nasal cannula oxygen Cardiovascular status: blood pressure returned to baseline and stable Postop Assessment: no apparent nausea or vomiting Anesthetic complications: no   No complications documented.  Last Vitals:  Vitals:   09/27/20 1404 09/27/20 1419  BP: 125/82 123/77  Pulse: 86 69  Resp: 12 16  Temp:    SpO2: 99% 97%    Last Pain:  Vitals:   09/27/20 1400  TempSrc:   PainSc: 5                  Kennieth Rad

## 2020-09-27 NOTE — Progress Notes (Signed)
Pt transferred to Short Stay via stretcher by transport.

## 2020-09-27 NOTE — MAU Note (Signed)
Lab notified again requesting time on the hcg. Lab apologetic and resulted the blood work.
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
## 2020-09-27 NOTE — Op Note (Signed)
Breanna [name] PROCEDURE DATE: 09/27/2020  PREOPERATIVE DIAGNOSIS: Ruptured ectopic pregnancy involving right tube and ovary POSTOPERATIVE DIAGNOSIS: Ruptured right fallopian tube ectopic pregnancy PROCEDURE: Laparoscopic right salpingoophorectomy and removal of ectopic pregnancy SURGEON:  Dr.M. Eloni Darius ANESTHESIOLOGIST: No responsible provider has been recorded for the case. Anesthesiologist: Marcene Duos, MD CRNA: Nils Pyle, CRNA; Demetrio Lapping, CRNA  INDICATIONS: 34 y.o. G3P2002 at [redacted]w[redacted]d here with the preoperative diagnoses as listed above.  Please refer to preoperative notes for more details. Patient was counseled regarding need for laparoscopic salpingectomy. Risks of surgery including bleeding which may require transfusion or reoperation, infection, injury to bowel or other surrounding organs, need for additional procedures including laparotomy and other postoperative/anesthesia complications were explained to patient.  Written informed consent was obtained.  FINDINGS:  100 amount of hemoperitoneum estimated to be about 50 of blood and clots.  Dilated right fallopian tube containing ectopic gestation wrapped around the right ovary, unable to delineate between tube and ovary Small normal appearing uterus, normal left fallopian tube, left ovary ANESTHESIA: General INTRAVENOUS FLUIDS As recorded ESTIMATED BLOOD LOSS: 150 ml URINE OUTPUT: As Recorded SPECIMENS: Right fallopian tube and ovary containing ectopic gestation COMPLICATIONS: None immediate  PROCEDURE IN DETAIL:  The patient was taken to the operating room where general anesthesia was administered and was found to be adequate.  She was placed in the dorsal lithotomy position, and was prepped and draped in a sterile manner.  A Foley catheter was inserted into her bladder and attached to constant drainage and a uterine manipulator was then advanced into the uterus .    After an adequate timeout was performed, attention was  turned to the abdomen where an umbilical incision was made with the scalpel. Fascia was exposed with S retractors and grasped with Kocher clamps. Fascia was cut with Mayo . Peritoneum was grasped with tonsil clamp and cut with Metzenbaum . The connors of the Fascia were then secured with 0 Vicryl.  A 12 -mm Excel Hessian trochar with sleeve was then placed without difficulty and secured with connors Fascia sutures.  The abdomen was then insufflated with carbon dioxide gas and adequate pneumoperitoneum was obtained.  A survey of the patient's pelvis and abdomen revealed the findings above.  Two 5-mm left lower quadrant ports were then placed under direct visualization. The above findings were noted.   The Nezhat suction irrigator was then used to suction the hemoperitoneum and irrigate the pelvis.  Attention was then turned to the right fallopian tube which was grasped and ligated from the underlying mesosalpinx and uterine attachment using the Harmonic instrument. As noted above the specimen included the right ovary. Good hemostasis was noted.  The specimen was placed in an EndoCatch bag and removed from the abdomen intact.  The abdomen was irrigated and blood clots removed. Inspection of all ligated sites revealed good hemostasis. The abdomen was desufflated, and all instruments were removed.  The fascial incision was closed with 0 Vicryl an interrupted suture of 3-0 Vicryl was used to closed the subcutaneous space.The umbilical incision was closed with 4-0 Vicryl and the lateral incisions were secured with Dermabond. The uterine manipulator and foley cathter was removed.  The patient tolerated the procedure well.  All instruments, needles, and sponge counts were correct x 2. The patient was taken to the recovery room in stable condition.   The patient will be discharged to home as per PACU criteria.  Routine postoperative instructions given.  She was prescribed Dialuid, Ibuprofen. She will  follow up in the  clinic in about 2-3 weeks for postoperative evaluation.  Nettie Elm, MD, FACOG Attending Obstetrician & Gynecologist Faculty Practice, [facility]

## 2020-09-27 NOTE — Transfer of Care (Signed)
Immediate Anesthesia Transfer of Care Note  Patient: Breanna [name]  Procedure(s) Performed: LAPAROSCOPIC RIGHT SALPINO-OOPHORECTOMY WITH REMOVAL OF ECTOPIC PREGNANCY (Right Abdomen)  Patient Location: PACU  Anesthesia Type:General  Level of Consciousness: drowsy and patient cooperative  Airway & Oxygen Therapy: Patient Spontanous Breathing and Patient connected to face mask oxygen  Post-op Assessment: Report given to RN and Post -op Vital signs reviewed and stable  Post vital signs: Reviewed and stable  Last Vitals:  Vitals Value Taken Time  BP 131/73 09/27/20 1333  Temp    Pulse 85 09/27/20 1336  Resp 26 09/27/20 1336  SpO2 97 % 09/27/20 1336  Vitals shown include unvalidated device data.  Last Pain:  Vitals:   09/27/20 1003  TempSrc: Oral  PainSc:       Patients Stated Pain Goal: 0 (09/27/20 0957)  Complications: No complications documented.

## 2020-09-27 NOTE — H&P (Signed)
[facility] Attending History and Physical CC: pelvic pain and cramping  Breanna [name] is a 34 y.o. G3P2002 at 16w2dwho presented to [facility]U today for evaluation of abdominal pain and cramping.  She also noted 1 week of intermittent vaginal bleeding. Pt notes LMP 058f/15/21.  The patient notes right sided cramping with occasional sharp pain.  Nausea is noted with emesis x 3 over the last 24 hours.  Pt denies syncope or lightheadedness.  She does note remote history of trichomonas and chlamydia at age 34  Past Medical History:  Diagnosis Date  . Asthma   . Depression    not current was 10 years ago  . Polycystic ovarian syndrome    Past Surgical History:  Procedure Laterality Date  . FOOT SURGERY    . NO PAST SURGERIES    . WISDOM TOOTH EXTRACTION     OB History  Gravida Para Term Preterm AB Living  '3 2 2     2  ' SAB TAB Ectopic Multiple Live Births          2    # Outcome Date GA Lbr Len/2nd Weight Sex Delivery Anes PTL Lv  3 Current           2 Term 03/31/13 [id number] / 00:09 3890 g M Vag-Spont EPI  LIV  1 Term 08/27/11 [id number] / 00:15 3997 g M Vag-Spont EPI  LIV     Birth Comments: WNL  Patient denies any other pertinent gynecologic issues.  No current facility-administered medications on file prior to encounter.   Current Outpatient Medications on File Prior to Encounter  Medication Sig Dispense Refill  . acetaminophen (TYLENOL) 650 MG CR tablet Take 1,300 mg by mouth every 8 (eight) hours as needed for pain.    . FLMarland Kitchenoxetine (PROZAC) 20 MG capsule Take 1 capsule (20 mg total) by mouth daily. 30 capsule 0  . hydrOXYzine (ATARAX/VISTARIL) 25 MG tablet Take 1 tablet (25 mg total) by mouth 3 (three) times daily as needed for anxiety. 30 tablet 0  . amoxicillin-clavulanate (AUGMENTIN) 875-125 MG tablet Take 1 tablet by mouth 2 (two) times daily. One po bid x 7 days (Patient not taking: Reported on 06/26/2020) 14 tablet 0  . Blood Pressure  Monitoring (BLOOD PRESSURE KIT) DEVI 1 Device by Does not apply route as needed. 1 each 0  . ibuprofen (ADVIL,MOTRIN) 200 MG tablet Take 600-800 mg by mouth every 8 (eight) hours as needed for mild pain or moderate pain. (Patient not taking: Reported on 09/16/2020)    . methocarbamol (ROBAXIN) 500 MG tablet Take 1 tablet (500 mg total) by mouth 2 (two) times daily as needed for muscle spasms. (Patient not taking: Reported on 06/26/2019) 20 tablet 0  . methylphenidate (RITALIN LA) 20 MG 24 hr capsule Take 20 mg by mouth daily. (Patient not taking: Reported on 09/16/2020)    . Misc. Devices (GOJJI WEIGHT SCALE) MISC 1 Device by Does not apply route as needed. 1 each 0  . norgestimate-ethinyl estradiol (ORTHO-CYCLEN) 0.25-35 MG-MCG tablet Take 1 tablet by mouth daily. (Patient not taking: Reported on 06/26/2020) 1 Package 11  . predniSONE (STERAPRED UNI-PAK 21 TAB) 10 MG (21) TBPK tablet Take by mouth daily. Take 6 tabs by mouth daily  for 2 days, then 5 tabs for 2 days, then 4 tabs for 2 days, then 3 tabs for 2 days, 2 tabs for 2 days, then 1 tab by mouth daily for 2 days (  Patient not taking: Reported on 04/18/2020) 42 tablet 0   Allergies  Allergen Reactions  . Iodine Rash  . Morphine And Related Nausea And Vomiting  . Avelox [Moxifloxacin Hcl In Nacl] Rash  . Percocet [Oxycodone-Acetaminophen] Hives and Rash    Social History:   reports that she has been smoking cigarettes. She has been smoking about 0.50 packs per day. She has never used smokeless tobacco. She reports current drug use. Drug: Marijuana. She reports that she does not drink alcohol. Family History  Problem Relation Age of Onset  . Bipolar disorder Father   . Alcohol abuse Father   . Heart disease Brother   . Dementia Paternal Uncle   . Heart disease Maternal Grandmother        a fib  . Cancer Maternal Grandmother        breast  . Dementia Maternal Grandfather   . Hyperlipidemia Maternal Grandfather   . Diabetes Maternal  Grandfather     Review of Systems: Pertinent items noted in HPI and remainder of comprehensive ROS otherwise negative.  PHYSICAL EXAM: Blood pressure 114/61, pulse 66, temperature 98.4 F (36.9 C), temperature source Oral, resp. rate 17, height '5\' 8"'  (1.727 m), weight 115.7 kg, last menstrual period 07/31/2020, SpO2 99 %. CONSTITUTIONAL: Well-developed, well-nourished female in no acute distress.  HENT:  Normocephalic, atraumatic, External right and left ear normal. Oropharynx is clear and moist EYES: Conjunctivae and EOM are normal. NECK: Normal range of motion, supple, no masses SKIN: Skin is warm and dry. No rash noted. Not diaphoretic. No erythema. No pallor. NEUROLOGIC: Alert and oriented to person, place, and time. Normal reflexes, muscle tone coordination. No cranial nerve deficit noted. PSYCHIATRIC: Normal mood and affect. Normal behavior. Normal judgment and thought content. CARDIOVASCULAR: Normal heart rate noted, regular rhythm RESPIRATORY: Effort and breath sounds normal, no problems with respiration noted ABDOMEN: Soft, obese, mildly tender RLQ, nondistended. PELVIC: Normal appearing external genitalia; normal appearing vaginal mucosa and cervix.    Normal uterine size, no other palpable masses,mild/moderate uterine tenderness, no adnexal pain on exam, moderate amount of blood on pad MUSCULOSKELETAL: Normal range of motion. No tenderness.  No cyanosis, clubbing, or edema.  2+ distal pulses.  Labs: Results for orders placed or performed during the hospital encounter of 09/27/20 (from the past 336 hour(s))  hCG, quantitative, pregnancy   Collection Time: 09/27/20  2:31 AM  Result Value Ref Range   hCG, Beta Chain, Quant, S 310 (H) <5 mIU/mL  CBC   Collection Time: 09/27/20  2:31 AM  Result Value Ref Range   WBC 8.0 4.0 - 10.5 K/uL   RBC 4.28 3.87 - 5.11 MIL/uL   Hemoglobin 13.0 12.0 - 15.0 g/dL   HCT 39.0 36 - 46 %   MCV 91.1 80.0 - 100.0 fL   MCH 30.4 26.0 - 34.0 pg    MCHC 33.3 30.0 - 36.0 g/dL   RDW 13.3 11.5 - 15.5 %   Platelets 260 150 - 400 K/uL   nRBC 0.0 0.0 - 0.2 %  Comprehensive metabolic panel   Collection Time: 09/27/20  2:31 AM  Result Value Ref Range   Sodium 138 135 - 145 mmol/L   Potassium 3.3 (L) 3.5 - 5.1 mmol/L   Chloride 109 98 - 111 mmol/L   CO2 20 (L) 22 - 32 mmol/L   Glucose, Bld 125 (H) 70 - 99 mg/dL   BUN 6 6 - 20 mg/dL   Creatinine, Ser 0.72 0.44 - 1.00 mg/dL  Calcium 9.0 8.9 - 10.3 mg/dL   Total Protein 6.3 (L) 6.5 - 8.1 g/dL   Albumin 3.6 3.5 - 5.0 g/dL   AST 22 15 - 41 U/L   ALT 27 0 - 44 U/L   Alkaline Phosphatase 97 38 - 126 U/L   Total Bilirubin 0.7 0.3 - 1.2 mg/dL   GFR, Estimated >60 >60 mL/min   Anion gap 9 5 - 15  Wet prep, genital   Collection Time: 09/27/20  2:55 AM   Specimen: Vaginal  Result Value Ref Range   Yeast Wet Prep HPF POC NONE SEEN NONE SEEN   Trich, Wet Prep NONE SEEN NONE SEEN   Clue Cells Wet Prep HPF POC NONE SEEN NONE SEEN   WBC, Wet Prep HPF POC MANY (A) NONE SEEN   Sperm NONE SEEN   Respiratory Panel by RT PCR (Flu A&B, Covid) - Nasopharyngeal Swab   Collection Time: 09/27/20  4:25 AM   Specimen: Nasopharyngeal Swab  Result Value Ref Range   SARS Coronavirus 2 by RT PCR NEGATIVE NEGATIVE   Influenza A by PCR NEGATIVE NEGATIVE   Influenza B by PCR NEGATIVE NEGATIVE  Results for orders placed or performed in visit on 09/16/20 (from the past 336 hour(s))  POCT urine pregnancy   Collection Time: 09/16/20 10:40 AM  Result Value Ref Range   Preg Test, Ur Positive (A) Negative    Imaging Studies: US OB LESS THAN 14 WEEKS WITH OB TRANSVAGINAL  Result Date: 09/27/2020 CLINICAL DATA:  Bleeding x1 week EXAM: OBSTETRIC <14 WK [place] AND TRANSVAGINAL OB US TECHNIQUE: Both transabdominal and transvaginal ultrasound examinations were performed for complete evaluation of the gestation as well as the maternal uterus, adnexal regions, and pelvic cul-de-sac. Transvaginal technique was performed to  assess early pregnancy. COMPARISON:  None recent. FINDINGS: Intrauterine gestational sac: None Maternal uterus/adnexae: The uterus is unremarkable without evidence for an IUP. The left ovary is unremarkable. In the region of the right adnexa, there is a heterogeneous mass measuring approximately 5.4 x 3.9 cm. This mass is hypovascular. This mass cannot be easily separated from the surrounding ovarian tissue. There is a trace amount of pelvic free fluid. IMPRESSION: 1. No IUP identified on this exam. 2. In the region of the right adnexa, there is a heterogeneous mass measuring approximately 5 cm. This mass is not easily separated from the right ovary. In the setting of a positive pregnancy test, this raises suspicion a right adnexal ectopic until proven otherwise. As a pregnancy test was not available at the time of this dictation, other differential considerations include a hemorrhagic cyst versus less likely right-sided ovarian torsion. Correlation with pregnancy test is recommended. In the setting of a negative pregnancy test, consider repeat exam with Doppler as clinically indicated. Correlation with beta HCG and follow-up is recommended. Electronically Signed   By: Constance Holster M.D.   On: 09/27/2020 03:54    Assessment: Active Problems:   Ectopic pregnancy without intrauterine pregnancy Spoke with radiology twice regarding low quant HCG and characteristics of right adnexal mass.  Per Radiology, the consensus of the 3 radiologists is still ectopic pregnancy until otherwise specified.  Plan: Preparation for operative laparoscopy and right salpingectomy.  34 y.o. [id number] with right ectopic pregnancy per radiology.   On exam, she had stable vital signs, and physical exam  . Patient was counseled regarding need for operative laparoscopy and salpingectomy. Risks of surgery including bleeding which may require transfusion or reoperation, infection, injury to bowel or other  surrounding organs, need for  additional procedures including (laparoscopy or) laparotomy were explained to patient and written informed consent was obtained.  Patient has been NPO since 2100 and she will remain NPO for procedure. Anesthesia and OR aware.  Preoperative prophylactic antibiotics and SCDs ordered on call to the OR.  To OR when ready.     Lynnda Shields, MD, [place] for [facility], [place]

## 2020-09-27 NOTE — Anesthesia Procedure Notes (Signed)
Procedure Name: Intubation Date/Time: 09/27/2020 11:27 AM Performed by: Lowella Dell, CRNA Pre-anesthesia Checklist: Patient identified, Emergency Drugs available, Suction available and Patient being monitored Patient Re-evaluated:Patient Re-evaluated prior to induction Oxygen Delivery Method: Circle System Utilized Preoxygenation: Pre-oxygenation with 100% oxygen Induction Type: IV induction and Rapid sequence Laryngoscope Size: Mac and 3 Grade View: Grade I Tube type: Oral Tube size: 7.0 mm Number of attempts: 1 Airway Equipment and Method: Stylet Placement Confirmation: ETT inserted through vocal cords under direct vision,  positive ETCO2 and breath sounds checked- equal and bilateral Secured at: 22 cm Tube secured with: Tape Dental Injury: Teeth and Oropharynx as per pre-operative assessment

## 2020-09-27 NOTE — Telephone Encounter (Signed)
Patient called the on-call nurse last night concerning some bleeding and pain she has been experiencing. Return called this am to follow up but there was know voicemail to leave a message.

## 2020-09-27 NOTE — MAU Note (Signed)
Lab notified requesting status of hcg result. Lab apologetic and stated they would result it soon.

## 2020-09-27 NOTE — Discharge Summary (Signed)
Physician Discharge Summary  Patient ID: Breanna [name] MRN: [id number] DOB/AGE: 33-May-1987 33 y.o.  Admit date: 09/27/2020 Discharge date: 09/27/2020  Admission Diagnoses: Probable right ectopic pregnancy  Discharge Diagnoses:  Active Problems:   Ectopic pregnancy without intrauterine pregnancy   Discharged Condition: good  Hospital Course: Breanna [name] was admitted with above Dx. She underwent Laparoscopy which revealed ruptured right ectopic pregnancy. RSO was completed. See OP note for additional information. See was recovered in PACU and then discharged home.   Consults: None  Significant Diagnostic Studies: labs  Treatments: surgery: Lap RSO  Discharge Exam: Blood pressure 132/80, pulse 89, temperature (!) 97.2 F (36.2 C), resp. rate (!) 21, height '5\' 8"'  (1.727 m), weight 115.7 kg, last menstrual period 07/31/2020, SpO2 97 %. Lungs clear Heart RRR Abd soft + BS drsg intact Ext non tender  Disposition: Discharge disposition: 01-Home or Self Care       Discharge Instructions    Call MD for:  difficulty breathing, headache or visual disturbances   Complete by: As directed    Call MD for:  extreme fatigue   Complete by: As directed    Call MD for:  hives   Complete by: As directed    Call MD for:  persistant dizziness or light-headedness   Complete by: As directed    Call MD for:  persistant nausea and vomiting   Complete by: As directed    Call MD for:  redness, tenderness, or signs of infection (pain, swelling, redness, odor or green/yellow discharge around incision site)   Complete by: As directed    Call MD for:  severe uncontrolled pain   Complete by: As directed    Call MD for:  temperature >100.4   Complete by: As directed    Diet - low sodium heart healthy   Complete by: As directed    Increase activity slowly   Complete by: As directed    Remove dressing in 48 hours   Complete by: As directed      Allergies as of 09/27/2020      Reactions    Iodine Rash   Morphine And Related Nausea And Vomiting   Avelox [moxifloxacin Hcl In Nacl] Rash   Percocet [oxycodone-acetaminophen] Hives, Rash      Medication List    STOP taking these medications   amoxicillin-clavulanate 875-125 MG tablet Commonly known as: Augmentin   Gojji Weight Scale Misc   methocarbamol 500 MG tablet Commonly known as: ROBAXIN   methylphenidate 20 MG 24 hr capsule Commonly known as: RITALIN LA   norgestimate-ethinyl estradiol 0.25-35 MG-MCG tablet Commonly known as: ORTHO-CYCLEN   predniSONE 10 MG (21) Tbpk tablet Commonly known as: STERAPRED UNI-PAK 21 TAB     TAKE these medications   acetaminophen 650 MG CR tablet Commonly known as: TYLENOL Take 1,300 mg by mouth every 8 (eight) hours as needed for pain.   Blood Pressure Kit Devi 1 Device by Does not apply route as needed.   FLUoxetine 20 MG capsule Commonly known as: PROZAC Take 1 capsule (20 mg total) by mouth daily.   HYDROmorphone 2 MG tablet Commonly known as: DILAUDID Take 1 tablet (2 mg total) by mouth every 4 (four) hours as needed for severe pain.   hydrOXYzine 25 MG tablet Commonly known as: ATARAX/VISTARIL Take 1 tablet (25 mg total) by mouth 3 (three) times daily as needed for anxiety.   ibuprofen 800 MG tablet Commonly known as: ADVIL Take 1 tablet (800 mg total) by  mouth every 8 (eight) hours as needed. What changed:   medication strength  how much to take  reasons to take this       Follow-up Information    [facility]. Schedule an appointment as soon as possible for a visit in 2 week(s).   Why: Post op appt with Dr. Gardiner Fanti  Contact information: [address] [place] [address] [phone]              Signed: Chancy Milroy 09/27/2020, 1:51 PM

## 2020-09-27 NOTE — Discharge Instructions (Signed)
Laparoscopic Tubal Removal for Ectopic Pregnancy Discharge Instructions Laparoscopic tubal removal is a procedure that removes the fallopian tube containing the ectopic pregnancy. RISKS AND COMPLICATIONS   Infection.  Bleeding.  Injury to surrounding organs.  Anesthetic side effects.  Failure of the procedure.  Risks of future ectopic pregnancy on the other side PROCEDURE   You may be given a medicine to help you relax (sedative) before the procedure. You will be given a medicine to make you sleep (general anesthetic) during the procedure.  A tube will be put down your throat to help your breath while under general anesthesia.  Two small cuts (incisions) are made in the lower abdominal area and one incision is made near the belly button.  Your abdominal area will be inflated with a safe gas (carbon dioxide). This helps give the surgeon room to operate, visualize, and helps the surgeon avoid other organs.  A thin, lighted tube (laparoscope) with a camera attached is inserted into your abdomen through the incision near the belly button. Other small instruments are also inserted through the other abdominal incisions.  The fallopian tube is located and are removed.  After the fallopian tube is removed, the gas is released from the abdomen.  The incisions will be closed with stitches (sutures), and Dermabond. A bandage may be placed over the incisions. AFTER THE PROCEDURE   You will also have some mild abdominal discomfort for 3-7 days. You will be given pain medicine to ease any discomfort.  As long as there are no problems, you may be allowed to go home. Someone will need to drive you home and be with you for at least 24 hours once home.  You may have some mild discomfort in the throat. This is from the tube placed in your throat while you were sleeping.  You may experience discomfort in the shoulder area from some trapped air between the liver and diaphragm. This sensation is  normal and will slowly go away on its own. HOME CARE INSTRUCTIONS   Take all medicines as directed.  Only take over-the-counter or prescription medicines for pain, discomfort, or fever as directed by your caregiver.  Resume daily activities as directed.  Showers are preferred over baths.  You may resume sexual activities in 1 week or as directed.  Do not drive while taking narcotics. SEEK MEDICAL CARE IF: .  There is increasing abdominal pain.  You feel lightheaded or faint.  You have the chills.  You have an oral temperature above 102 F (38.9 C).  There is pus-like (purulent) drainage from any of the wounds.  You are unable to pass gas or have a bowel movement.  You feel sick to your stomach (nauseous) or throw up (vomit). MAKE SURE YOU:   Understand these instructions.  Will watch your condition.  Will get help right away if you are not doing well or get worse.  [employer] Patient Information 2013 [employer]     

## 2020-09-27 NOTE — MAU Note (Addendum)
...  Breanna [name] is a 34 y.o. at [redacted]w[redacted]d here in MAU reporting: dark brown clotting that began about a week ago when she would wipe. Patient reports as the days have gone by the bleeding has gotten "more bright red and increased" in amount. She reports it is now like an "early to mid period flow." She reports she experienced cramping about two and a half weeks ago.  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 She reports she was given Tylenol at work and the pain went away but the pain came back last night at 10:30pm.   Pain: 4/10 - on her right side.

## 2020-09-28 ENCOUNTER — Encounter (HOSPITAL_COMMUNITY): Payer: Self-pay | Admitting: Obstetrics and Gynecology

## 2020-09-30 ENCOUNTER — Telehealth: Payer: Self-pay

## 2020-09-30 LAB — SURGICAL PATHOLOGY

## 2020-09-30 NOTE — Telephone Encounter (Signed)
Received message from the on-call nurse concerning patient having shoulder pain. No answer or voice mail to leave a message.

## 2020-10-09 ENCOUNTER — Encounter: Payer: Medicaid Other | Admitting: Obstetrics and Gynecology

## 2020-10-09 ENCOUNTER — Ambulatory Visit (INDEPENDENT_AMBULATORY_CARE_PROVIDER_SITE_OTHER): Payer: Medicaid Other | Admitting: Obstetrics and Gynecology

## 2020-10-09 ENCOUNTER — Other Ambulatory Visit: Payer: Self-pay

## 2020-10-09 ENCOUNTER — Encounter: Payer: Self-pay | Admitting: Obstetrics and Gynecology

## 2020-10-09 DIAGNOSIS — Z9889 Other specified postprocedural states: Secondary | ICD-10-CM | POA: Insufficient documentation

## 2020-10-09 DIAGNOSIS — Z9079 Acquired absence of other genital organ(s): Secondary | ICD-10-CM

## 2020-10-09 DIAGNOSIS — Z90721 Acquired absence of ovaries, unilateral: Secondary | ICD-10-CM | POA: Insufficient documentation

## 2020-10-09 NOTE — Patient Instructions (Signed)
Health Maintenance, Female Adopting a healthy lifestyle and getting preventive care are important in promoting health and wellness. Ask your health care provider about:  The right schedule for you to have regular tests and exams.  Things you can do on your own to prevent diseases and keep yourself healthy. What should I know about diet, weight, and exercise? Eat a healthy diet   Eat a diet that includes plenty of vegetables, fruits, low-fat dairy products, and lean protein.  Do not eat a lot of foods that are high in solid fats, added sugars, or sodium. Maintain a healthy weight Body mass index (BMI) is used to identify weight problems. It estimates body fat based on height and weight. Your health care provider can help determine your BMI and help you achieve or maintain a healthy weight. Get regular exercise Get regular exercise. This is one of the most important things you can do for your health. Most adults should:  Exercise for at least 150 minutes each week. The exercise should increase your heart rate and make you sweat (moderate-intensity exercise).  Do strengthening exercises at least twice a week. This is in addition to the moderate-intensity exercise.  Spend less time sitting. Even light physical activity can be beneficial. Watch cholesterol and blood lipids Have your blood tested for lipids and cholesterol at 33 years of age, then have this test every 5 years. Have your cholesterol levels checked more often if:  Your lipid or cholesterol levels are high.  You are older than 33 years of age.  You are at high risk for heart disease. What should I know about cancer screening? Depending on your health history and family history, you may need to have cancer screening at various ages. This may include screening for:  Breast cancer.  Cervical cancer.  Colorectal cancer.  Skin cancer.  Lung cancer. What should I know about heart disease, diabetes, and high blood  pressure? Blood pressure and heart disease  High blood pressure causes heart disease and increases the risk of stroke. This is more likely to develop in people who have high blood pressure readings, are of African descent, or are overweight.  Have your blood pressure checked: ? Every 3-5 years if you are 18-39 years of age. ? Every year if you are 40 years old or older. Diabetes Have regular diabetes screenings. This checks your fasting blood sugar level. Have the screening done:  Once every three years after age 40 if you are at a normal weight and have a low risk for diabetes.  More often and at a younger age if you are overweight or have a high risk for diabetes. What should I know about preventing infection? Hepatitis B If you have a higher risk for hepatitis B, you should be screened for this virus. Talk with your health care provider to find out if you are at risk for hepatitis B infection. Hepatitis C Testing is recommended for:  Everyone born from 1945 through 1965.  Anyone with known risk factors for hepatitis C. Sexually transmitted infections (STIs)  Get screened for STIs, including gonorrhea and chlamydia, if: ? You are sexually active and are younger than 33 years of age. ? You are older than 33 years of age and your health care provider tells you that you are at risk for this type of infection. ? Your sexual activity has changed since you were last screened, and you are at increased risk for chlamydia or gonorrhea. Ask your health care provider if   you are at risk.  Ask your health care provider about whether you are at high risk for HIV. Your health care provider may recommend a prescription medicine to help prevent HIV infection. If you choose to take medicine to prevent HIV, you should first get tested for HIV. You should then be tested every 3 months for as long as you are taking the medicine. Pregnancy  If you are about to stop having your period (premenopausal) and  you may become pregnant, seek counseling before you get pregnant.  Take 400 to 800 micrograms (mcg) of folic acid every day if you become pregnant.  Ask for birth control (contraception) if you want to prevent pregnancy. Osteoporosis and menopause Osteoporosis is a disease in which the bones lose minerals and strength with aging. This can result in bone fractures. If you are 65 years old or older, or if you are at risk for osteoporosis and fractures, ask your health care provider if you should:  Be screened for bone loss.  Take a calcium or vitamin D supplement to lower your risk of fractures.  Be given hormone replacement therapy (HRT) to treat symptoms of menopause. Follow these instructions at home: Lifestyle  Do not use any products that contain nicotine or tobacco, such as cigarettes, e-cigarettes, and chewing tobacco. If you need help quitting, ask your health care provider.  Do not use street drugs.  Do not share needles.  Ask your health care provider for help if you need support or information about quitting drugs. Alcohol use  Do not drink alcohol if: ? Your health care provider tells you not to drink. ? You are pregnant, may be pregnant, or are planning to become pregnant.  If you drink alcohol: ? Limit how much you use to 0-1 drink a day. ? Limit intake if you are breastfeeding.  Be aware of how much alcohol is in your drink. In the [place] one drink equals one 12 oz bottle of beer (355 mL), one 5 oz glass of wine (148 mL), or one 1 oz glass of hard liquor (44 mL). General instructions  Schedule regular health, dental, and eye exams.  Stay current with your vaccines.  Tell your health care provider if: ? You often feel depressed. ? You have ever been abused or do not feel safe at home. Summary  Adopting a healthy lifestyle and getting preventive care are important in promoting health and wellness.  Follow your health care provider's instructions about healthy  diet, exercising, and getting tested or screened for diseases.  Follow your health care provider's instructions on monitoring your cholesterol and blood pressure. This information is not intended to replace advice given to you by your health care provider. Make sure you discuss any questions you have with your health care provider. Document Revised: 10/26/2018 Document Reviewed: 10/26/2018 Elsevier Patient Education  2020 Elsevier Inc.  

## 2020-10-09 NOTE — Progress Notes (Signed)
Patient presents for post op for ruptured ectopic pregnancy. Patient had a unilateral oophorectomy and salpingectomy. Patient complains of having inconsistent left side back pain. She denies having problems with urination. She also states that has pain during a bowel movement. Patient would like to discuss long term contraception.

## 2020-10-09 NOTE — Progress Notes (Signed)
Patient ID: Breanna [name], female   DOB: [birth year], 34 y.o.   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  PE AF VSS Lungs clear Heart RRR Abd soft + BS incisions well healed  A/P Post op  Doing well. Return to nl ADL's as tolerates. Return to work note provided. Desires IUD. Instructed to reframe from IC until IUD placement. Pt to schedule IUD placement

## 2020-10-21 ENCOUNTER — Encounter: Payer: Medicaid Other | Admitting: Obstetrics and Gynecology

## 2020-10-22 ENCOUNTER — Ambulatory Visit: Payer: Medicaid Other | Admitting: Advanced Practice Midwife

## 2020-10-24 ENCOUNTER — Ambulatory Visit: Payer: Medicaid Other | Admitting: Obstetrics

## 2020-10-28 ENCOUNTER — Telehealth: Payer: Self-pay

## 2020-10-28 NOTE — Telephone Encounter (Signed)
Returned call, no answer, left vm 

## 2020-11-19 ENCOUNTER — Other Ambulatory Visit: Payer: Self-pay

## 2020-11-19 ENCOUNTER — Ambulatory Visit: Payer: Medicaid Other | Admitting: Obstetrics

## 2020-11-19 ENCOUNTER — Encounter: Payer: Self-pay | Admitting: Obstetrics

## 2020-11-19 VITALS — BP 118/80 | HR 80 | Wt 252.5 lb

## 2020-11-19 DIAGNOSIS — Z30430 Encounter for insertion of intrauterine contraceptive device: Secondary | ICD-10-CM

## 2020-11-19 DIAGNOSIS — Z975 Presence of (intrauterine) contraceptive device: Secondary | ICD-10-CM

## 2020-11-19 LAB — POCT URINE PREGNANCY: Preg Test, Ur: NEGATIVE

## 2020-11-19 MED ORDER — LEVONORGESTREL 20 MCG/24HR IU IUD: INTRAUTERINE_SYSTEM | Freq: Once | INTRAUTERINE | Status: AC

## 2020-11-19 NOTE — Progress Notes (Signed)
IUD Insertion Procedure Note  Pre-operative Diagnosis: Desires Long-Acting Reversible Contraception ( LARC )  Post-operative Diagnosis: same  Indications: contraception  Procedure Details  Urine pregnancy test was done in office and result was negative.  The risks (including infection, bleeding, pain, and uterine perforation) and benefits of the procedure were explained to the patient and Written informed consent was obtained.    Cervix cleansed with Betadine. Uterus sounded to 7 cm. IUD inserted without difficulty. String visible and trimmed. Patient tolerated procedure well.  IUD Information: Mirena, Lot # [id number],   Expiration date:  FEB  2024.  Condition: Stable  Complications: None  Plan:  The patient was advised to call for any fever or for prolonged or severe pain or bleeding. She was advised to use NSAID as needed for mild to moderate pain.   Attending Physician Documentation: I was present for or participated in the entire procedure, including opening and closing.  Brock Bad, MD 11/19/2020 11:56 AM

## 2020-11-19 NOTE — Progress Notes (Signed)
Pt presents for IUD Mirena insertion UPT - negative

## 2020-11-22 ENCOUNTER — Telehealth: Payer: Self-pay

## 2020-11-22 DIAGNOSIS — Z30431 Encounter for routine checking of intrauterine contraceptive device: Secondary | ICD-10-CM

## 2020-11-22 DIAGNOSIS — Z3401 Encounter for supervision of normal first pregnancy, first trimester: Secondary | ICD-10-CM

## 2020-11-22 NOTE — Telephone Encounter (Signed)
Order placed

## 2020-11-22 NOTE — Telephone Encounter (Signed)
Correct order place

## 2020-11-22 NOTE — Telephone Encounter (Signed)
ORDER CHANGE

## 2020-12-03 ENCOUNTER — Ambulatory Visit: Payer: Medicaid Other

## 2020-12-12 ENCOUNTER — Ambulatory Visit
Admission: RE | Admit: 2020-12-12 | Discharge: 2020-12-12 | Disposition: A | Discharge status: Home / Self Care | Payer: Medicaid Other | Source: Ambulatory Visit | Attending: Obstetrics | Admitting: Obstetrics

## 2020-12-12 ENCOUNTER — Other Ambulatory Visit: Payer: Self-pay

## 2020-12-12 DIAGNOSIS — Z30431 Encounter for routine checking of intrauterine contraceptive device: Secondary | ICD-10-CM | POA: Insufficient documentation

## 2021-04-29 ENCOUNTER — Ambulatory Visit (INDEPENDENT_AMBULATORY_CARE_PROVIDER_SITE_OTHER): Payer: Medicaid Other

## 2021-04-29 ENCOUNTER — Ambulatory Visit (HOSPITAL_COMMUNITY)
Admission: EM | Admit: 2021-04-29 | Discharge: 2021-04-29 | Disposition: A | Discharge status: Home / Self Care | Payer: Medicaid Other | Attending: Emergency Medicine | Admitting: Emergency Medicine

## 2021-04-29 ENCOUNTER — Other Ambulatory Visit: Payer: Self-pay

## 2021-04-29 ENCOUNTER — Encounter (HOSPITAL_COMMUNITY): Payer: Self-pay | Admitting: Emergency Medicine

## 2021-04-29 DIAGNOSIS — J189 Pneumonia, unspecified organism: Secondary | ICD-10-CM | POA: Diagnosis not present

## 2021-04-29 DIAGNOSIS — R059 Cough, unspecified: Secondary | ICD-10-CM | POA: Diagnosis not present

## 2021-04-29 MED ORDER — DEXAMETHASONE SODIUM PHOSPHATE 10 MG/ML IJ SOLN
INTRAMUSCULAR | Status: AC
  Filled 2021-04-29: qty 1

## 2021-04-29 MED ORDER — AMOXICILLIN-POT CLAVULANATE 875-125 MG PO TABS: 1.0000 | ORAL_TABLET | Freq: Two times a day (BID) | ORAL | 0 refills | Status: AC

## 2021-04-29 MED ORDER — BENZONATATE 200 MG PO CAPS: 200.0000 mg | ORAL_CAPSULE | Freq: Three times a day (TID) | ORAL | 0 refills | Status: DC | PRN

## 2021-04-29 MED ORDER — IBUPROFEN 600 MG PO TABS: 600.0000 mg | ORAL_TABLET | Freq: Four times a day (QID) | ORAL | 0 refills | Status: DC | PRN

## 2021-04-29 MED ORDER — AEROCHAMBER PLUS MISC: 2 refills | Status: DC

## 2021-04-29 MED ORDER — DEXAMETHASONE SODIUM PHOSPHATE 10 MG/ML IJ SOLN
10.0000 mg | Freq: Once | INTRAMUSCULAR | Status: AC
  Administered 2021-04-29: 16:00:00 10 mg via INTRAMUSCULAR

## 2021-04-29 NOTE — ED Triage Notes (Addendum)
Patient presents to [facility] for evaluation after having URI symptoms x 1 week.  States she has been having SOB with exertion, worsening over the past week.  Was in contact with [facility], had a COVId and flu test, negative.  They would not see her because she is still symptomatic.  Patient states she is being treated with Azithromycin x 2 days.  Patient states she checks her Sp)2 at home, and it has been dropping down to 88% when she is up and moving around.  97%, speaking in full sentences in triage.  Possible fever over the weekend.  Patient is currently taking Azithromycin, Inhaler and Prednisone.  Patient states she is using the inhaler a "few times an hour"

## 2021-04-29 NOTE — Discharge Instructions (Addendum)
Your x-ray shows that you have bronchitis versus an atypical pneumonia.  I am going to treat this as a pneumonia by broadening your antibiotic coverage to Augmentin.  Continue the azithromycin.  2 puffs from your albuterol inhaler every 4 hours for 2 days, then every 6 hours for 2 days, then as needed.  Use your spacer with it.  May take 600 mg of ibuprofen combined with 1000 mg of Tylenol together 3 or 4 times a day as needed for pain, Tessalon for the cough.  Follow-up with a primary care provider of your choice, see list below.  Below is a list of primary care practices who are taking new patients for you to follow-up with.  [facility] internal medicine clinic Ground Floor - [facility], [address] [place], [place], [place] [address] [phone]  [facility] at [facility] [address] [place], [place] [address] [phone]  [facility] [address] [place], [place] [address] [phone]  [facility] [phone] [address] [place] [address]  [facility]: [address] [place] [address]  [phone]  [facility]: [address] [place] [address]  [phone]  [facility] : [address] [place] [address] [phone]  [facility]: [address] [place] [address] [phone]  [facility]: [address] [place] [address] [phone]  Dr. Oneal Grout [address] [facility] [place] [address]  [phone]  Go to www.goodrx.com  or www.costplusdrugs.com to look up your medications. This will give you a list of where you can find your prescriptions at the most affordable prices. Or ask the pharmacist what the cash price is, or if they have  any other discount programs available to help make your medication more affordable. This can be less expensive than what you would pay with insurance.

## 2021-04-29 NOTE — ED Provider Notes (Addendum)
HPI  SUBJECTIVE:  Breanna [name] is a 35 y.o. female who presents with 5 days of shortness of breath, cough productive of greenish-white mucus, fevers T-max 101.2, body aches, intermittent headaches, wheezing, chest tightness and achy chest pain with inspiration bilaterally.  Nasal congestion, postnasal drip starting 2 to 3 days ago.  Some shortness of breath with exertion.  No rhinorrhea, sinus pain or pressure, nausea, vomiting, diarrhea, abdominal pain, COVID or flu exposure.  She was evaluated by her PMD, had a negative COVID PCR and negative rapid flu test, was placed on azithromycin, a 6-day prednisone taper and inhaler.  She is on day number 3 out of 5 of azithromycin.  States she vomited up the first dose because she took it on empty stomach.  She has been tolerating p.o. since.  She states that she has  used her albuterol inhaler over 70 times in the past 24 hours, and is using it every hour.  She has a pulse oximeter and states that her O2 sat ranges from 88 to 95%.  It drops to 88% with exertion . no antipyretic in the past 6 hours.  No surgery in the past 4 weeks, calf pain, swelling, recent immobilization, hemoptysis, OCP use.  She has a past medical history of bilateral pneumonia that required admission and is very concerned that she may have it again.  She has a history of asthma, continues to smoke.,  PCOS.  No history of PE, DVT, cancer, diabetes, hypertension.  LMP: Irregular.  Denies possibility of being pregnant.  PMD: [facility], but would like to find a new PMD    Past Medical History:  Diagnosis Date   Asthma    Depression    not current was 10 years ago   Polycystic ovarian syndrome     Past Surgical History:  Procedure Laterality Date   FOOT SURGERY     LAPAROSCOPIC UNILATERAL SALPINGECTOMY Right 09/27/2020   Procedure: LAPAROSCOPIC RIGHT SALPINO-OOPHORECTOMY WITH REMOVAL OF ECTOPIC PREGNANCY;  Surgeon: Hermina Staggers, MD;  Location: [facility] OR;  Service: Gynecology;   Laterality: Right;   NO PAST SURGERIES     WISDOM TOOTH EXTRACTION      Family History  Problem Relation Age of Onset   Bipolar disorder Father    Alcohol abuse Father    Heart disease Brother    Dementia Paternal Uncle    Heart disease Maternal Grandmother        a fib   Cancer Maternal Grandmother        breast   Dementia Maternal Grandfather    Hyperlipidemia Maternal Grandfather    Diabetes Maternal Grandfather     Social History   Tobacco Use   Smoking status: Some Days    Packs/day: 0.50    Pack years: 0.00    Types: Cigarettes   Smokeless tobacco: Never  Vaping Use   Vaping Use: Never used  Substance Use Topics   Alcohol use: No    Alcohol/week: 0.0 standard drinks   Drug use: Yes    Types: Marijuana    No current facility-administered medications for this encounter.  Current Outpatient Medications:    amoxicillin-clavulanate (AUGMENTIN) 875-125 MG tablet, Take 1 tablet by mouth 2 (two) times daily for 5 days., Disp: 10 tablet, Rfl: 0   benzonatate (TESSALON) 200 MG capsule, Take 1 capsule (200 mg total) by mouth 3 (three) times daily as needed for cough., Disp: 30 capsule, Rfl: 0   ibuprofen (ADVIL) 600 MG tablet, Take  1 tablet (600 mg total) by mouth every 6 (six) hours as needed., Disp: 30 tablet, Rfl: 0   Spacer/Aero-Holding Chambers (AEROCHAMBER PLUS) inhaler, Use with inhaler, Disp: 1 each, Rfl: 2   acetaminophen (TYLENOL) 650 MG CR tablet, Take 1,300 mg by mouth every 8 (eight) hours as needed for pain. (Patient not taking: Reported on 11/19/2020), Disp: , Rfl:    hydrOXYzine (ATARAX/VISTARIL) 25 MG tablet, Take 1 tablet (25 mg total) by mouth 3 (three) times daily as needed for anxiety., Disp: 30 tablet, Rfl: 0   methylphenidate (RITALIN LA) 20 MG 24 hr capsule, Take 20 mg by mouth daily as needed., Disp: , Rfl:    methylphenidate (RITALIN) 10 MG tablet, Take 10 mg by mouth daily as needed., Disp: , Rfl:   Allergies  Allergen Reactions   Iodine Rash    Morphine And Related Nausea And Vomiting   Avelox [Moxifloxacin Hcl In Nacl] Rash   Percocet [Oxycodone-Acetaminophen] Hives and Rash     ROS  As noted in HPI.   Physical Exam  BP 134/89 (BP Location: Right Arm)   Pulse 71   Temp 98.6 F (37 C) (Oral)   Resp 20   SpO2 95%   Constitutional: Well developed, well nourished, no acute distress Eyes:  EOMI, conjunctiva normal bilaterally HENT: Normocephalic, atraumatic,mucus membranes moist.  Mild nasal congestion.  No maxillary, frontal sinus tenderness.  No obvious postnasal drip. Respiratory: Normal inspiratory effort .diffuse expiratory wheezing throughout all lung fields.  No rales, rhonchi.  Positive anterior chest wall tenderness. Cardiovascular: Normal rate, regular rhythm, no murmurs rubs or gallops GI: nondistended skin: No rash, skin intact Musculoskeletal: Calves symmetric, nontender, no edema Neurologic: Alert & oriented x 3, no focal neuro deficits Psychiatric: Speech and behavior appropriate   ED Course   Medications  dexamethasone (DECADRON) injection 10 mg (10 mg Intramuscular Given 04/29/21 1616)    Orders Placed This Encounter  Procedures   DG Chest 2 View    Standing Status:   Standing    Number of Occurrences:   1    Order Specific Question:   Reason for Exam (SYMPTOM  OR DIAGNOSIS REQUIRED)    Answer:   cough fever r/o PNA    No results found for this or any previous visit (from the past 24 hour(s)). DG Chest 2 View  Result Date: 04/29/2021 CLINICAL DATA:  Cough and fever EXAM: CHEST - 2 VIEW COMPARISON:  10/15/2009 FINDINGS: No consolidative process is seen. Central airways thickening. Streaky left mid lung and bibasilar opacity. Normal cardiomediastinal silhouette. No pneumothorax. IMPRESSION: 1. No consolidative pneumonia. 2. Central airways thickening. Streaky left mid lung and left greater than right basilar opacity, could be due to bronchitis versus atypical/viral pneumonia. Electronically Signed    By: Jasmine Pang M.D.   On: 04/29/2021 15:50    ED Clinical Impression  1. Community acquired pneumonia, unspecified laterality      ED Assessment/Plan  Patient had negative recent COVID PCR testing and negative rapid flu testing.  Will not repeat.  Will check chest x-ray to rule out pneumonia.  She had negative COVID PCR test that came back yesterday, and a negative rapid flu this week.  Reviewed imaging independently.  Streaky mid left lung and left greater than right basilar opacity bronchitis versus atypical/viral pneumonia.  See radiology report for full details.  Patient not improving despite current treatment of azithromycin, albuterol and prednisone taper.  Suspect asthma exacerbation on top of a pneumonia.  She is satting well  on room air, speaking in full sentences, and he is in no respiratory distress.  Feel that we can continue treating her as an outpatient at this time.  Will give dexamethasone 10 mg IM, broaden her coverage to Augmentin twice daily for 5 days to go along with the azithromycin, and give her a spacer for use with her albuterol inhaler, tessalon.  She will need to follow-up with a PMD of her choice if she is not getting any better in several days.  Will order assistance in finding a PMD.  ER return precautions given  Discussed imaging, MDM, treatment plan, and plan for follow-up with patient. Discussed sn/sx that should prompt return to the ED. patient agrees with plan.   Meds ordered this encounter  Medications   dexamethasone (DECADRON) injection 10 mg   amoxicillin-clavulanate (AUGMENTIN) 875-125 MG tablet    Sig: Take 1 tablet by mouth 2 (two) times daily for 5 days.    Dispense:  10 tablet    Refill:  0   Spacer/Aero-Holding Chambers (AEROCHAMBER PLUS) inhaler    Sig: Use with inhaler    Dispense:  1 each    Refill:  2    Please educate patient on use   ibuprofen (ADVIL) 600 MG tablet    Sig: Take 1 tablet (600 mg total) by mouth every 6 (six) hours  as needed.    Dispense:  30 tablet    Refill:  0   benzonatate (TESSALON) 200 MG capsule    Sig: Take 1 capsule (200 mg total) by mouth 3 (three) times daily as needed for cough.    Dispense:  30 capsule    Refill:  0       *This clinic note was created using Scientist, clinical (histocompatibility and immunogenetics). Therefore, there may be occasional mistakes despite careful proofreading.  ?    Domenick Gong, MD 04/30/21 1751    Domenick Gong, MD 04/30/21 [phone]

## 2022-04-03 ENCOUNTER — Emergency Department (HOSPITAL_COMMUNITY)
Admission: EM | Admit: 2022-04-03 | Discharge: 2022-04-03 | Disposition: A | Discharge status: Home / Self Care | Payer: Medicaid Other | Attending: Emergency Medicine | Admitting: Emergency Medicine

## 2022-04-03 ENCOUNTER — Encounter (HOSPITAL_COMMUNITY): Payer: Self-pay | Admitting: Emergency Medicine

## 2022-04-03 ENCOUNTER — Emergency Department (HOSPITAL_COMMUNITY): Payer: Medicaid Other

## 2022-04-03 DIAGNOSIS — T7621XA Adult sexual abuse, suspected, initial encounter: Secondary | ICD-10-CM | POA: Diagnosis not present

## 2022-04-03 DIAGNOSIS — S0990XA Unspecified injury of head, initial encounter: Secondary | ICD-10-CM | POA: Insufficient documentation

## 2022-04-03 MED ORDER — IBUPROFEN 800 MG PO TABS
800.0000 mg | ORAL_TABLET | Freq: Once | ORAL | Status: AC
  Administered 2022-04-03: 800 mg via ORAL
  Filled 2022-04-03: qty 1
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
## 2022-04-03 NOTE — SANE Note (Signed)
FNE saw patient at approximately 2300.  Patient declined Geographical information systems officer Nursing services as well as consult to care management.
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
## 2022-07-08 ENCOUNTER — Ambulatory Visit: Payer: Medicaid Other | Admitting: Obstetrics & Gynecology

## 2023-02-28 ENCOUNTER — Emergency Department (HOSPITAL_BASED_OUTPATIENT_CLINIC_OR_DEPARTMENT_OTHER)
Admission: EM | Admit: 2023-02-28 | Discharge: 2023-02-28 | Disposition: A | Discharge status: Home / Self Care | Payer: Medicaid Other | Attending: Emergency Medicine | Admitting: Emergency Medicine

## 2023-02-28 ENCOUNTER — Encounter (HOSPITAL_BASED_OUTPATIENT_CLINIC_OR_DEPARTMENT_OTHER): Payer: Self-pay

## 2023-02-28 ENCOUNTER — Emergency Department (HOSPITAL_BASED_OUTPATIENT_CLINIC_OR_DEPARTMENT_OTHER): Payer: Medicaid Other | Admitting: Radiology

## 2023-02-28 ENCOUNTER — Other Ambulatory Visit: Payer: Self-pay

## 2023-02-28 DIAGNOSIS — X500XXA Overexertion from strenuous movement or load, initial encounter: Secondary | ICD-10-CM | POA: Insufficient documentation

## 2023-02-28 DIAGNOSIS — M546 Pain in thoracic spine: Secondary | ICD-10-CM

## 2023-02-28 DIAGNOSIS — Y92511 Restaurant or cafe as the place of occurrence of the external cause: Secondary | ICD-10-CM | POA: Insufficient documentation

## 2023-02-28 DIAGNOSIS — M62830 Muscle spasm of back: Secondary | ICD-10-CM | POA: Insufficient documentation

## 2023-02-28 MED ORDER — MELOXICAM 7.5 MG PO TABS: 7.5000 mg | ORAL_TABLET | Freq: Every day | ORAL | 0 refills | Status: DC

## 2023-02-28 MED ORDER — HYDROCODONE-ACETAMINOPHEN 5-325 MG PO TABS
1.0000 | ORAL_TABLET | Freq: Once | ORAL | Status: AC
  Administered 2023-02-28: 1 via ORAL
  Filled 2023-02-28: qty 1

## 2023-02-28 MED ORDER — LIDOCAINE 5 % EX PTCH
1.0000 | MEDICATED_PATCH | CUTANEOUS | Status: DC
  Administered 2023-02-28: 1 via TRANSDERMAL
  Filled 2023-02-28: qty 1

## 2023-02-28 MED ORDER — CYCLOBENZAPRINE HCL 10 MG PO TABS: 10.0000 mg | ORAL_TABLET | Freq: Two times a day (BID) | ORAL | 0 refills | Status: DC | PRN

## 2023-02-28 MED ORDER — KETOROLAC TROMETHAMINE 15 MG/ML IJ SOLN
15.0000 mg | Freq: Once | INTRAMUSCULAR | Status: AC
  Administered 2023-02-28: 15 mg via INTRAMUSCULAR
  Filled 2023-02-28: qty 1

## 2023-02-28 NOTE — ED Notes (Signed)
To x-ray

## 2023-02-28 NOTE — ED Provider Notes (Signed)
Breanna [name]   CSN: [id number] Arrival date & time: 02/28/23  2020     History  Chief Complaint  Patient presents with   Back Pain    Breanna [name] is a 37 y.o. female.  Patient presents to the emergency department today for ongoing right scapular pain.  Symptoms started about 4 to 6 weeks ago.  Symptoms started spontaneously.  Patient does do a job where she does a lot of lifting in [employer].  She reports a sharp pain at the lower part of the scapula.  No upper or lower extremity weakness, numbness or tingling.  Pain is worse with movement.  She is having a lot of difficulty sleeping because any pressure on the area causes a lot of pain.  She is having pain in the area when she has a bowel movement or when she takes a deep breath.  No fevers or persistent cough.  No hemoptysis.  No history of blood clots.  Area is very tender to palpation.  OTC medications such as ibuprofen are not helping.  She has had increasing pain in the past day or 2 after coughing hard.  Partner does state that she has given the patient some of her methocarbamol and a small dose of gabapentin without improvement.       Home Medications Prior to Admission medications   Medication Sig Start Date End Date Taking? Authorizing Provider  atomoxetine (STRATTERA) 40 MG capsule Take 40 mg by mouth at bedtime. 02/11/22   [provider]  benzonatate (TESSALON) 200 MG capsule Take 1 capsule (200 mg total) by mouth 3 (three) times daily as needed for cough. Patient not taking: Reported on 04/03/2022 04/29/21   Domenick Gong, MD  FLUoxetine (PROZAC) 40 MG capsule Take 40 mg by mouth daily. 02/11/22   [provider]  hydrOXYzine (ATARAX) 50 MG tablet Take 50 mg by mouth 2 (two) times daily as needed for anxiety. 02/11/22   [provider]  hydrOXYzine (ATARAX/VISTARIL) 25 MG tablet Take 1 tablet (25 mg total) by mouth 3 (three) times  daily as needed for anxiety. Patient not taking: Reported on 04/03/2022 06/27/20   Money, Gerlene Burdock, FNP  ibuprofen (ADVIL) 600 MG tablet Take 1 tablet (600 mg total) by mouth every 6 (six) hours as needed. Patient not taking: Reported on 04/03/2022 04/29/21   Domenick Gong, MD  QUEtiapine (SEROQUEL) 25 MG tablet Take 12.5 mg by mouth at bedtime. 02/11/22   [provider]  Spacer/Aero-Holding Chambers (AEROCHAMBER PLUS) inhaler Use with inhaler Patient not taking: Reported on 04/03/2022 04/29/21   Domenick Gong, MD      Allergies    Iodine, Morphine and related, Avelox [moxifloxacin hcl in nacl], and Percocet [oxycodone-acetaminophen]    Review of Systems   Review of Systems  Physical Exam Updated Vital Signs BP (!) 150/81 (BP Location: Right Arm)   Pulse (!) 102   Temp 98.5 F (36.9 C) (Oral)   Resp 18   Ht  (1.727 m)   Wt 113.4 kg   SpO2 96%   BMI 38.01 kg/m  Physical Exam Vitals and nursing [name] reviewed.  Constitutional:      Appearance: She is well-developed.  HENT:     Head: Normocephalic and atraumatic.  Eyes:     Pupils: Pupils are equal, round, and reactive to light.  Cardiovascular:     Pulses: Normal pulses. No decreased pulses.  Musculoskeletal:  General: Tenderness present.     Right shoulder: No tenderness. Normal range of motion.     Left shoulder: No tenderness. Normal range of motion.     Cervical back: Normal range of motion and neck supple. No tenderness. Normal range of motion.     Thoracic back: Spasms and tenderness present.     Lumbar back: No spasms or tenderness.       Back:     Comments: Patient winces to palpation over the inferior border of the right scapula.  No skin findings.  Palpable spasm felt.  Skin:    General: Skin is warm and dry.  Neurological:     Mental Status: She is alert.     Sensory: No sensory deficit.     Comments: Motor, sensation, and vascular distal to the injury is fully intact.   Psychiatric:         Mood and Affect: Mood normal.     ED Results / Procedures / Treatments   Labs (all labs ordered are listed, but only abnormal results are displayed) Labs Reviewed - No data to display  EKG None  Radiology No results found.  Procedures Procedures    Medications Ordered in ED Medications  ketorolac (TORADOL) 15 MG/ML injection 15 mg (has no administration in time range)  lidocaine (LIDODERM) 5 % 1 patch (has no administration in time range)    ED Course/ Medical Decision Making/ A&P    Patient seen and examined. History obtained directly from patient and partner at bedside who gives additional history details.  Labs/EKG: None ordered  Imaging: Due to duration of symptoms, will obtain x-ray of the scapula  Medications/Fluids: Ordered: IM Toradol, Lidoderm patch  Most recent vital signs reviewed and are as follows: BP (!) 150/81 (BP Location: Right Arm)   Pulse (!) 102   Temp 98.5 F (36.9 C) (Oral)   Resp 18   Ht  (1.727 m)   Wt 113.4 kg   SpO2 96%   BMI 38.01 kg/m   Initial impression: Musculoskeletal back pain and spasm.  9:39 PM Reassessment performed. Patient appears stable.  Requesting additional pain medication for home.  Offered oral dose Vicodin here, but advised NSAIDs and muscle relaxer trial for home.  Imaging personally visualized and interpreted including: X-ray of the scapula, agree no fractures, no pulmonary findings visualized.  Reviewed pertinent lab work and imaging with patient at bedside. Questions answered.   Most current vital signs reviewed and are as follows: BP (!) 150/81 (BP Location: Right Arm)   Pulse (!) 102   Temp 98.5 F (36.9 C) (Oral)   Resp 18   Ht  (1.727 m)   Wt 113.4 kg   SpO2 96%   BMI 38.01 kg/m   Plan: Discharge to home.   Prescriptions written for: Trial Flexeril, meloxicam  Patient counseled on proper use of muscle relaxant medication.  They were told not to drink alcohol, drive any  vehicle, or do any dangerous activities while taking this medication.  Patient verbalized understanding.  Advised avoid ibuprofen while taking meloxicam.  Other home care instructions discussed: No heavy lifting pushing or pulling, ice and heat, gentle stretching  ED return instructions discussed: Uncontrolled pain, new symptoms, difficulty breathing or other concerns  Follow-up instructions discussed: Patient encouraged to follow-up with their PCP or orthopedic referral in the next 1 week for reevaluation  Medical Decision Making Amount and/or Complexity of Data Reviewed Radiology: ordered.  Risk Prescription drug management.   Patient with subacute to chronic right scapular pain.  She is very tender on exam to palpation.  No skin findings or superficial findings.  X-ray of the scapula is negative.  Lung sounds are clear.  Treatment plan as above.  Given ongoing nature of symptoms, she would likely benefit from outpatient orthopedic evaluation and recommendations.  Low concern for pneumonia, PE, ACS, gallbladder or other intra-abdominal etiology.  The patient's vital signs, pertinent lab work and imaging were reviewed and interpreted as discussed in the ED course. Hospitalization was considered for further testing, treatments, or serial exams/observation. However as patient is well-appearing, has a stable exam, and reassuring studies today, I do not feel that they warrant admission at this time. This plan was discussed with the patient who verbalizes agreement and comfort with this plan and seems reliable and able to return to the Emergency Department with worsening or changing symptoms.          Final Clinical Impression(s) / ED Diagnoses Final diagnoses:  Right-sided thoracic back pain, unspecified chronicity    Rx / DC Orders ED Discharge Orders          Ordered    cyclobenzaprine (FLEXERIL) 10 MG tablet  2 times daily PRN        02/28/23 2138     meloxicam (MOBIC) 7.5 MG tablet  Daily        02/28/23 2138              Renne Crigler, PA-C 02/28/23 2143    Glyn Ade, MD 02/28/23 2241

## 2023-02-28 NOTE — ED Triage Notes (Signed)
Patient here POV from Home.  Endorses Pain to her Right Lower Scapula that began 6 Weeks ago. Unsure if it began with an injury or not and yesterday she coughed and felt that same pain worsen. Worse with Movements.   NAD Noted during Triage. A&Ox4. Gcs 15. Ambulatory.

## 2023-02-28 NOTE — Discharge Instructions (Signed)
Please read and follow all provided instructions.  Your diagnoses today include:  1. Right-sided thoracic back pain, unspecified chronicity    Tests performed today include: Vital signs - see below for your results today  Medications prescribed:  Flexeril (cyclobenzaprine) - muscle relaxer medication  DO NOT drive or perform any activities that require you to be awake and alert because this medicine can make you drowsy.   Meloxicam - anti-inflammatory pain medication  You have been prescribed an anti-inflammatory medication or NSAID. Take with food. Do not take aspirin, ibuprofen, or naproxen if taking this medication. Take smallest effective dose for the shortest duration needed for your pain. Stop taking if you experience stomach pain or vomiting.   Take any prescribed medications only as directed.  Home care instructions:  Follow any educational materials contained in this packet Please rest, use ice or heat on your back for the next several days Do not lift, push, pull anything more than 10 pounds for the next week  Follow-up instructions: Please follow-up with your primary care provider or the orthopedist listed in the next 1 week for further evaluation of your symptoms.   Return instructions:  SEEK IMMEDIATE MEDICAL ATTENTION IF YOU HAVE: New numbness, tingling, weakness, or problem with the use of your arms or legs Severe back pain not relieved with medications Loss control of your bowels or bladder Increasing pain in any areas of the body (such as chest or abdominal pain) Shortness of breath, dizziness, or fainting.  Worsening nausea (feeling sick to your stomach), vomiting, fever, or sweats Any other emergent concerns regarding your health   Additional Information:  Your vital signs today were: BP (!) 150/81 (BP Location: Right Arm)   Pulse (!) 102   Temp 98.5 F (36.9 C) (Oral)   Resp 18   Ht 5\' 8"  (1.727 m)   Wt 113.4 kg   SpO2 96%   BMI 38.01 kg/m  If  your blood pressure (BP) was elevated above 135/85 this visit, please have this repeated by your doctor within one month. --------------

## 2023-06-14 ENCOUNTER — Ambulatory Visit (HOSPITAL_COMMUNITY)
Admission: EM | Admit: 2023-06-14 | Discharge: 2023-06-14 | Disposition: A | Discharge status: Home / Self Care | Payer: Self-pay | Attending: Emergency Medicine | Admitting: Emergency Medicine

## 2023-06-14 ENCOUNTER — Encounter (HOSPITAL_COMMUNITY): Payer: Self-pay

## 2023-06-14 ENCOUNTER — Encounter (HOSPITAL_BASED_OUTPATIENT_CLINIC_OR_DEPARTMENT_OTHER): Payer: Self-pay

## 2023-06-14 ENCOUNTER — Other Ambulatory Visit: Payer: Self-pay

## 2023-06-14 ENCOUNTER — Emergency Department (HOSPITAL_BASED_OUTPATIENT_CLINIC_OR_DEPARTMENT_OTHER)
Admission: EM | Admit: 2023-06-14 | Discharge: 2023-06-14 | Discharge status: Against Medical Advice | Payer: Self-pay | Attending: Emergency Medicine | Admitting: Emergency Medicine

## 2023-06-14 DIAGNOSIS — Z5321 Procedure and treatment not carried out due to patient leaving prior to being seen by health care provider: Secondary | ICD-10-CM | POA: Insufficient documentation

## 2023-06-14 DIAGNOSIS — B029 Zoster without complications: Secondary | ICD-10-CM

## 2023-06-14 DIAGNOSIS — R21 Rash and other nonspecific skin eruption: Secondary | ICD-10-CM | POA: Insufficient documentation

## 2023-06-14 MED ORDER — VALACYCLOVIR HCL 1 G PO TABS: 1000.0000 mg | ORAL_TABLET | Freq: Three times a day (TID) | ORAL | 0 refills | Status: AC

## 2023-06-14 NOTE — ED Notes (Signed)
Pt left and states she is going to the UC per registration

## 2023-06-14 NOTE — ED Triage Notes (Signed)
Pt presents with an area of raised red bumps on right lower back since Thursday. Reports itching around the cluster.

## 2023-06-14 NOTE — Discharge Instructions (Signed)
Today you are being treated for herpes zoster commonly known as shingles which is a viral rash that causes itching, tingling, pain and burning sensation, presentation.  As per person, typically this rash occurs only on one side of the body and may follow-up with dermatomes of the skin  Take valacyclovir every 8 hours for 7 days, this medicine helps to reduce the amount of virus in the body which helps to minimize symptoms and ideally helps to speed up clearing of rash  You are considered contagious until rash has crusted over  Keep area covered by clothing to prevent spread  You may continue topical products such as Benadryl and clobetasol cream to help minimize pruritus, may also use hydroxyzine taking every 6 hours as needed for severe itching  Avoid long exposure to heat as this may cause irritation to your skin  You may follow-up with this urgent care as needed for any further concerns

## 2023-06-14 NOTE — ED Provider Notes (Signed)
[facility]    CSN: [id number] Arrival date & time: 06/14/23  1510      History   Chief Complaint Chief Complaint  Patient presents with   Rash    HPI Breanna [name] is a 37 y.o. female.   Patient presents for evaluation of a erythematous pruritic rash present to the back for 4 days.  Pruritus has been persistent and severe.  Has attempted use of hydrocortisone, clobetasol and Benadryl without relief.  Denies changes in toiletries, diet or recent travel.  No other member of household or close contact has similar symptoms.  Has not occurred before.  Past Medical History:  Diagnosis Date   Asthma    Depression    not current was 10 years ago   Polycystic ovarian syndrome     Patient Active Problem List   Diagnosis Date Noted   History of right salpingo-oophorectomy 10/09/2020   Post-operative state 10/09/2020   Severe episode of recurrent major depressive disorder, without psychotic features (HCC)    BMI 40.0-44.9, adult (HCC) 06/26/2019   Current every day smoker 06/26/2019   PCOS (polycystic ovarian syndrome) 05/10/2013   MAJOR DEPRESSIVE DISORDER SINGLE EPISODE UNSPEC 10/24/2007   ASTHMA 10/24/2007    Past Surgical History:  Procedure Laterality Date   FOOT SURGERY     LAPAROSCOPIC UNILATERAL SALPINGECTOMY Right 09/27/2020   Procedure: LAPAROSCOPIC RIGHT SALPINO-OOPHORECTOMY WITH REMOVAL OF ECTOPIC PREGNANCY;  Surgeon: Hermina Staggers, MD;  Location: [facility];  Service: Gynecology;  Laterality: Right;   NO PAST SURGERIES     WISDOM TOOTH EXTRACTION      OB History     Gravida  3   Para  2   Term  2   Preterm      AB  1   Living  2      SAB      IAB      Ectopic  1   Multiple      Live Births  2            Home Medications    Prior to Admission medications   Medication Sig Start Date End Date Taking? Authorizing Provider  FLUoxetine (PROZAC) 40 MG capsule Take 40 mg by mouth daily. 02/11/22  Yes [provider]   valACYclovir (VALTREX) 1000 MG tablet Take 1 tablet (1,000 mg total) by mouth 3 (three) times daily for 7 days. 06/14/23 06/21/23 Yes Shaida Route, Elita Boone, NP  atomoxetine (STRATTERA) 40 MG capsule Take 40 mg by mouth at bedtime. 02/11/22   [provider]  cyclobenzaprine (FLEXERIL) 10 MG tablet Take 1 tablet (10 mg total) by mouth 2 (two) times daily as needed for muscle spasms. 02/28/23   Renne Crigler, PA-C  hydrOXYzine (ATARAX) 50 MG tablet Take 50 mg by mouth 2 (two) times daily as needed for anxiety. 02/11/22   [provider]  meloxicam (MOBIC) 7.5 MG tablet Take 1 tablet (7.5 mg total) by mouth daily. 02/28/23   Renne Crigler, PA-C    Family History Family History  Problem Relation Age of Onset   Bipolar disorder Father    Alcohol abuse Father    Heart disease Brother    Dementia Paternal Uncle    Heart disease Maternal Grandmother        a fib   Cancer Maternal Grandmother        breast   Dementia Maternal Grandfather    Hyperlipidemia Maternal Grandfather    Diabetes Maternal Grandfather  Social History Social History   Tobacco Use   Smoking status: Some Days    Current packs/day: 1.00    Types: Cigarettes   Smokeless tobacco: Never  Vaping Use   Vaping status: Never Used  Substance Use Topics   Alcohol use: Yes    Comment: Occ   Drug use: Yes    Frequency: 1.0 times per week    Types: Marijuana     Allergies   Iodine, Morphine, Avelox [moxifloxacin hcl in nacl], and Percocet [oxycodone-acetaminophen]   Review of Systems Review of Systems  Skin:  Positive for rash.     Physical Exam Triage Vital Signs ED Triage Vitals  Encounter Vitals Group     BP 06/14/23 1553 124/84     Systolic BP Percentile --      Diastolic BP Percentile --      Pulse Rate 06/14/23 1553 62     Resp 06/14/23 1553 16     Temp 06/14/23 1553 99.4 F (37.4 C)     Temp Source 06/14/23 1553 Oral     SpO2 06/14/23 1553 96 %     Weight 06/14/23 1553 265 lb (120.2  kg)     Height 06/14/23 1553 5\' 8"  (1.727 m)     Head Circumference --      Peak Flow --      Pain Score 06/14/23 1552 2     Pain Loc --      Pain Education --      Exclude from Growth Chart --    No data found.  Updated Vital Signs BP 124/84 (BP Location: Left Arm)   Pulse 62   Temp 99.4 F (37.4 C) (Oral)   Resp 16   Ht 5\' 8"  (1.727 m)   Wt 265 lb (120.2 kg)   LMP 05/15/2023 (Approximate)   SpO2 96%   Breastfeeding No   BMI 40.29 kg/m   Visual Acuity Right Eye Distance:   Left Eye Distance:   Bilateral Distance:    Right Eye Near:   Left Eye Near:    Bilateral Near:     Physical Exam Constitutional:      Appearance: Normal appearance.  Eyes:     Extraocular Movements: Extraocular movements intact.  Pulmonary:     Effort: Pulmonary effort is normal.  Skin:    Comments: Erythematous cluster present to the right thoracic region   Neurological:     Mental Status: She is alert and oriented to person, place, and time. Mental status is at baseline.      UC Treatments / Results  Labs (all labs ordered are listed, but only abnormal results are displayed) Labs Reviewed - No data to display  EKG   Radiology No results found.  Procedures Procedures (including critical care time)  Medications Ordered in UC Medications - No data to display  Initial Impression / Assessment and Plan / UC Course  I have reviewed the triage vital signs and the nursing notes.  Pertinent labs & imaging results that were available during my care of the patient were reviewed by me and considered in my medical decision making (see chart for details).   Herpes zoster without complication  Presentation of rash consistent with shingles, discussed with patient, prescribed valacyclovir and discussed administration, recommended continue use of topical products for management of pruritus, patient endorses that she has hydroxyzine at home typically used for anxiety, advised to for use for  management of pruritus, discussed contagion and advised avoidance of long  exposure to heat, may follow-up with his urgent care as needed, given written handout on shingles Final Clinical Impressions(s) / UC Diagnoses   Final diagnoses:  Herpes zoster without complication     Discharge Instructions      Today you are being treated for herpes zoster commonly known as shingles which is a viral rash that causes itching, tingling, pain and burning sensation, presentation.  As per person, typically this rash occurs only on one side of the body and may follow-up with dermatomes of the skin  Take valacyclovir every 8 hours for 7 days, this medicine helps to reduce the amount of virus in the body which helps to minimize symptoms and ideally helps to speed up clearing of rash  You are considered contagious until rash has crusted over  Keep area covered by clothing to prevent spread  You may continue topical products such as Benadryl and clobetasol cream to help minimize pruritus, may also use hydroxyzine taking every 6 hours as needed for severe itching  Avoid long exposure to heat as this may cause irritation to your skin  You may follow-up with this urgent care as needed for any further concerns   ED Prescriptions     Medication Sig Dispense Auth. Provider   valACYclovir (VALTREX) 1000 MG tablet Take 1 tablet (1,000 mg total) by mouth 3 (three) times daily for 7 days. 21 tablet Seraphim Affinito, Elita Boone, NP      PDMP not reviewed this encounter.   Valinda Hoar, [place] 06/14/23 [phone]

## 2023-06-14 NOTE — ED Triage Notes (Signed)
Patient here today with c/o rash on the right mid part of her back since Thursday. She noticed the area itching on Tuesday. She has been using Clobetasol Propionate with no relief. She has also been trying Hydrocortisone cream and a benadryl gel with no relief.

## 2024-03-08 ENCOUNTER — Other Ambulatory Visit (HOSPITAL_COMMUNITY)
Admission: RE | Admit: 2024-03-08 | Discharge: 2024-03-08 | Disposition: A | Discharge status: Home / Self Care | Source: Ambulatory Visit | Attending: Obstetrics and Gynecology | Admitting: Obstetrics and Gynecology

## 2024-03-08 ENCOUNTER — Ambulatory Visit

## 2024-03-08 VITALS — BP 124/81 | HR 65 | Wt 282.0 lb

## 2024-03-08 DIAGNOSIS — N898 Other specified noninflammatory disorders of vagina: Secondary | ICD-10-CM | POA: Insufficient documentation

## 2024-03-08 DIAGNOSIS — B9689 Other specified bacterial agents as the cause of diseases classified elsewhere: Secondary | ICD-10-CM | POA: Insufficient documentation

## 2024-03-08 DIAGNOSIS — N760 Acute vaginitis: Secondary | ICD-10-CM | POA: Insufficient documentation

## 2024-03-08 DIAGNOSIS — Z113 Encounter for screening for infections with a predominantly sexual mode of transmission: Secondary | ICD-10-CM | POA: Insufficient documentation

## 2024-03-08 NOTE — Progress Notes (Signed)
..  SUBJECTIVE:  38 y.o. female complains of vaginal odor for 1-2 week(s). Denies abnormal vaginal bleeding or significant pelvic pain or fever. No UTI symptoms. Denies history of known exposure to STD.  No LMP recorded. (Menstrual status: IUD).  OBJECTIVE:  She appears well, afebrile. Urine dipstick: not done.  ASSESSMENT:   Vaginal Odor   PLAN:  GC, chlamydia, trichomonas, BVAG, CVAG probe sent to lab. Treatment: To be determined once lab results are received ROV prn if symptoms persist or worsen.

## 2024-03-09 ENCOUNTER — Encounter: Payer: Self-pay | Admitting: Obstetrics and Gynecology

## 2024-03-09 ENCOUNTER — Other Ambulatory Visit: Payer: Self-pay | Admitting: Obstetrics and Gynecology

## 2024-03-09 DIAGNOSIS — B9689 Other specified bacterial agents as the cause of diseases classified elsewhere: Secondary | ICD-10-CM

## 2024-03-09 LAB — CERVICOVAGINAL ANCILLARY ONLY
Bacterial Vaginitis (gardnerella): POSITIVE — AB
Candida Glabrata: NEGATIVE
Candida Vaginitis: NEGATIVE
Chlamydia: NEGATIVE
Comment: NEGATIVE
Comment: NEGATIVE
Comment: NEGATIVE
Comment: NEGATIVE
Comment: NEGATIVE
Comment: NORMAL
Neisseria Gonorrhea: NEGATIVE
Trichomonas: NEGATIVE

## 2024-03-09 MED ORDER — METRONIDAZOLE 500 MG PO TABS: 500.0000 mg | ORAL_TABLET | Freq: Two times a day (BID) | ORAL | 0 refills | Status: DC

## 2024-03-28 ENCOUNTER — Encounter: Payer: Self-pay | Admitting: Obstetrics & Gynecology

## 2024-03-28 ENCOUNTER — Ambulatory Visit (INDEPENDENT_AMBULATORY_CARE_PROVIDER_SITE_OTHER): Payer: MEDICAID | Admitting: Obstetrics & Gynecology

## 2024-03-28 ENCOUNTER — Other Ambulatory Visit (HOSPITAL_COMMUNITY)
Admission: RE | Admit: 2024-03-28 | Discharge: 2024-03-28 | Disposition: A | Discharge status: Home / Self Care | Payer: MEDICAID | Source: Ambulatory Visit | Attending: Obstetrics & Gynecology | Admitting: Obstetrics & Gynecology

## 2024-03-28 VITALS — BP 136/94 | HR 92 | Ht 68.25 in | Wt 285.0 lb

## 2024-03-28 DIAGNOSIS — Z01419 Encounter for gynecological examination (general) (routine) without abnormal findings: Secondary | ICD-10-CM | POA: Diagnosis present

## 2024-03-28 DIAGNOSIS — Z9079 Acquired absence of other genital organ(s): Secondary | ICD-10-CM | POA: Diagnosis not present

## 2024-03-28 DIAGNOSIS — E282 Polycystic ovarian syndrome: Secondary | ICD-10-CM | POA: Diagnosis not present

## 2024-03-28 DIAGNOSIS — Z1331 Encounter for screening for depression: Secondary | ICD-10-CM

## 2024-03-28 DIAGNOSIS — Z6841 Body Mass Index (BMI) 40.0 and over, adult: Secondary | ICD-10-CM

## 2024-03-28 DIAGNOSIS — Z90721 Acquired absence of ovaries, unilateral: Secondary | ICD-10-CM

## 2024-03-28 NOTE — Progress Notes (Signed)
 Pt is in office for routine gyn exam.   GAD and PHQ score 12.
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
## 2024-03-30 ENCOUNTER — Ambulatory Visit: Payer: Self-pay | Admitting: Obstetrics & Gynecology

## 2024-03-30 LAB — CYTOLOGY - PAP
Adequacy: ABSENT
Comment: NEGATIVE
Diagnosis: NEGATIVE
High risk HPV: NEGATIVE

## 2024-06-02 ENCOUNTER — Encounter (HOSPITAL_COMMUNITY): Payer: Self-pay

## 2024-06-02 ENCOUNTER — Emergency Department (HOSPITAL_COMMUNITY)
Admission: EM | Admit: 2024-06-02 | Discharge: 2024-06-02 | Disposition: A | Discharge status: Home / Self Care | Payer: MEDICAID | Attending: Emergency Medicine | Admitting: Emergency Medicine

## 2024-06-02 DIAGNOSIS — T40711A Poisoning by cannabis, accidental (unintentional), initial encounter: Secondary | ICD-10-CM | POA: Diagnosis not present

## 2024-06-02 DIAGNOSIS — X58XXXA Exposure to other specified factors, initial encounter: Secondary | ICD-10-CM | POA: Diagnosis not present

## 2024-06-02 DIAGNOSIS — F17200 Nicotine dependence, unspecified, uncomplicated: Secondary | ICD-10-CM | POA: Insufficient documentation

## 2024-06-02 DIAGNOSIS — T50901A Poisoning by unspecified drugs, medicaments and biological substances, accidental (unintentional), initial encounter: Secondary | ICD-10-CM | POA: Diagnosis present

## 2024-06-02 DIAGNOSIS — J45909 Unspecified asthma, uncomplicated: Secondary | ICD-10-CM | POA: Diagnosis not present

## 2024-06-02 LAB — URINALYSIS, ROUTINE W REFLEX MICROSCOPIC
Bilirubin Urine: NEGATIVE
Glucose, UA: NEGATIVE mg/dL
Hgb urine dipstick: NEGATIVE
Ketones, ur: NEGATIVE mg/dL
Leukocytes,Ua: NEGATIVE
Nitrite: NEGATIVE
Protein, ur: NEGATIVE mg/dL
Specific Gravity, Urine: 1.01 (ref 1.005–1.030)
pH: 6 (ref 5.0–8.0)

## 2024-06-02 LAB — COMPREHENSIVE METABOLIC PANEL WITH GFR
ALT: 15 U/L (ref 0–44)
AST: 17 U/L (ref 15–41)
Albumin: 3.2 g/dL — ABNORMAL LOW (ref 3.5–5.0)
Alkaline Phosphatase: 111 U/L (ref 38–126)
Anion gap: 11 (ref 5–15)
BUN: 8 mg/dL (ref 6–20)
CO2: 18 mmol/L — ABNORMAL LOW (ref 22–32)
Calcium: 8.6 mg/dL — ABNORMAL LOW (ref 8.9–10.3)
Chloride: 106 mmol/L (ref 98–111)
Creatinine, Ser: 0.9 mg/dL (ref 0.44–1.00)
GFR, Estimated: >60 mL/min (ref >60)
Glucose, Bld: 148 mg/dL — ABNORMAL HIGH (ref 70–99)
Potassium: 3.3 mmol/L — ABNORMAL LOW (ref 3.5–5.1)
Sodium: 135 mmol/L (ref 135–145)
Total Bilirubin: 0.3 mg/dL (ref 0.0–1.2)
Total Protein: 5.8 g/dL — ABNORMAL LOW (ref 6.5–8.1)

## 2024-06-02 LAB — CBC WITH DIFFERENTIAL/PLATELET
Abs Immature Granulocytes: 0.02 K/uL (ref 0.00–0.07)
Basophils Absolute: 0 K/uL (ref 0.0–0.1)
Basophils Relative: 0 %
Eosinophils Absolute: 0.1 K/uL (ref 0.0–0.5)
Eosinophils Relative: 1 %
HCT: 40 % (ref 36.0–46.0)
Hemoglobin: 13.4 g/dL (ref 12.0–15.0)
Immature Granulocytes: 0 %
Lymphocytes Relative: 16 %
Lymphs Abs: 1.3 K/uL (ref 0.7–4.0)
MCH: 31.9 pg (ref 26.0–34.0)
MCHC: 33.5 g/dL (ref 30.0–36.0)
MCV: 95.2 fL (ref 80.0–100.0)
Monocytes Absolute: 0.4 K/uL (ref 0.1–1.0)
Monocytes Relative: 4 %
Neutro Abs: 6.4 K/uL (ref 1.7–7.7)
Neutrophils Relative %: 79 %
Platelets: 236 K/uL (ref 150–400)
RBC: 4.2 MIL/uL (ref 3.87–5.11)
RDW: 13 % (ref 11.5–15.5)
WBC: 8.1 K/uL (ref 4.0–10.5)
nRBC: 0 % (ref 0.0–0.2)

## 2024-06-02 LAB — ACETAMINOPHEN LEVEL: Acetaminophen (Tylenol), Serum: <10 ug/mL — ABNORMAL LOW (ref 10–30)

## 2024-06-02 LAB — RAPID URINE DRUG SCREEN, HOSP PERFORMED
Amphetamines: POSITIVE — AB
Barbiturates: NOT DETECTED
Benzodiazepines: POSITIVE — AB
Cocaine: NOT DETECTED
Opiates: NOT DETECTED
Tetrahydrocannabinol: POSITIVE — AB

## 2024-06-02 LAB — SALICYLATE LEVEL: Salicylate Lvl: <7 mg/dL — ABNORMAL LOW (ref 7.0–30.0)

## 2024-06-02 LAB — ETHANOL: Alcohol, Ethyl (B): <15 mg/dL (ref <15)

## 2024-06-02 NOTE — ED Provider Notes (Signed)
 [facility] EMERGENCY DEPARTMENT AT [facility] Provider Note   CSN: [id number] Arrival date & time: 06/02/24  0015     Patient presents with: Drug Overdose   Breanna [name] is a 38 y.o. female.  Patient presents to the emergency department via EMS due to possible overdose on THC gummy along with alcohol.  She was agitated and acting erratically with EMS and EMS had to use soft restraints along with 10 mg of Haldol and 5 mg of Versed  for sedation.  The patient reportedly ate 1 gummy in the package states that the gummy has 1000 mg of THC.  The patient is sedated at the time of my assessment and unable to provide history.  Past medical history significant for major depressive disorder, obesity, PCOS, asthma    Drug Overdose       Prior to Admission medications   Medication Sig Start Date End Date Taking? Authorizing Provider  amphetamine-dextroamphetamine (ADDERALL XR) 30 MG 24 hr capsule Take 30 mg by mouth daily.    [provider]  cetirizine (ZYRTEC) 10 MG chewable tablet Chew 10 mg by mouth daily.    [provider]  desvenlafaxine (PRISTIQ) 25 MG 24 hr tablet Take 25 mg by mouth daily.    [provider]  Vitamin D , Ergocalciferol , (DRISDOL) 1.25 MG (50000 UNIT) CAPS capsule Take 50,000 Units by mouth every 7 (seven) days.    [provider]    Allergies: Iodine, Morphine, Avelox [moxifloxacin hcl in nacl], and Percocet [oxycodone -acetaminophen ]    Review of Systems  Updated Vital Signs BP 110/70   Pulse 100   Temp 98.9 F (37.2 C) (Oral)   Resp 18   SpO2 98%   Physical Exam Vitals and nursing note reviewed.  Constitutional:      General: She is not in acute distress.    Appearance: She is well-developed.  HENT:     Head: Normocephalic and atraumatic.  Eyes:     Conjunctiva/sclera: Conjunctivae normal.  Cardiovascular:     Rate and Rhythm: Normal rate and regular rhythm.     Heart sounds: No murmur  heard. Pulmonary:     Effort: Pulmonary effort is normal. No respiratory distress.     Breath sounds: Normal breath sounds.  Abdominal:     Palpations: Abdomen is soft.     Tenderness: There is no abdominal tenderness.  Musculoskeletal:        General: No swelling.     Cervical back: Neck supple.  Skin:    General: Skin is warm and dry.     Capillary Refill: Capillary refill takes less than 2 seconds.  Neurological:     Mental Status: She is alert.  Psychiatric:     Comments: Patient sedated at initial exam     (all labs ordered are listed, but only abnormal results are displayed) Labs Reviewed  CBC WITH DIFFERENTIAL/PLATELET  ETHANOL  SALICYLATE LEVEL  COMPREHENSIVE METABOLIC PANEL WITH GFR  ACETAMINOPHEN  LEVEL  URINALYSIS, ROUTINE W REFLEX MICROSCOPIC  RAPID URINE DRUG SCREEN, HOSP PERFORMED    EKG: None  Radiology: No results found.   Procedures   Medications Ordered in the ED - No data to display                                  Medical Decision Making Amount and/or Complexity of Data Reviewed Labs: ordered.   This patient presents to the  ED for concern of altered mental status, this involves an extensive number of treatment options, and is a complaint that carries with it a high risk of complications and morbidity.  The differential diagnosis includes accidental overdose, alcohol intoxication, metabolic disorder, endocrine disorder, others   Co morbidities / Chronic conditions that complicate the patient evaluation  Major depressive disorder   Additional history obtained:  Additional history obtained from EMR and EMS External records from outside source obtained and reviewed including urgent care notes   Lab Tests:  I Ordered, and personally interpreted labs.  The pertinent results include: Grossly unremarkable CBC, CMP, ethanol less than 15   Cardiac Monitoring: / EKG:  The patient was maintained on a cardiac monitor.  I personally viewed  and interpreted the cardiac monitored which showed an underlying rhythm of: Sinus rhythm   Social Determinants of Health:  Patient is an occasional tobacco smoker   Test / Admission - Considered:  Patient with presentation most consistent with reaction to [facility].  Patient treated with Haldol and Versed  by EMS.  She slept here in the department for multiple hours after arrival and was reassessed.  She is now alert and oriented.  Attempting a p.o. challenge at this time.  If patient passes p.o. challenge and is able to ambulate I will plan to discharge her home.  The patient is still fairly somnolent, likely from the earlier medications administered by EMS due to agitation.  Will allow patient to metabolize for a little longer and will attempt ambulation at that time.  She has passed a p.o. challenge at this time.  Patient care being transferred to [facility], PA-C at shift handoff.       Final diagnoses:  None    ED Discharge Orders     None          Logan Ubaldo KATHEE DEVONNA 06/02/24 [id number]    Carita Senior, MD 06/02/24 1525

## 2024-06-02 NOTE — ED Provider Notes (Signed)
 Signout from [place] PA-C at shift change. Briefly, patient presents for intoxication.  Patient took a high dose THC gummy.  She was agitated with EMS.  Reportedly received Haldol and Versed .  Patient currently sleeping.   Plan: Monitoring for safe discharge.   8:24 AM Reassessment performed. Patient appears comfortable, sitting on side of bed.  She is conversant.  She has had some water without vomiting.  States that family will be able to be with her today.  Labs and imaging personally reviewed and interpreted including: CBC unremarkable; CMP slightly low potassium at 3.3, glucose 148 with minimally low bicarb at 18 but normal anion gap; negative salicylate and acetaminophen  levels; negative ethanol; UA without compelling signs of infection.   Most current vital signs reviewed and are as follows: BP 94/63   Pulse (!) 57   Temp 98.4 F (36.9 C)   Resp (!) 21   SpO2 98%   Plan: Discharge when clinically sober, patient reports that she is ready to go.  BP (!) 105/94 (BP Location: Right Arm)   Pulse (!) 57   Temp 98.4 F (36.9 C)   Resp (!) 21   SpO2 98%   Return and follow-up instructions: Encouraged return to ED with new or worsening symptoms. Encouraged patient to follow-up with their provider as needed. Patient verbalized understanding and agreed with plan.        Desiderio Chew, PA-C 06/02/24 0825    Breanna Lamar BROCKS, MD 06/03/24 [phone]

## 2024-06-02 NOTE — Discharge Instructions (Addendum)
 You were evaluated this evening for altered mental status which was likely due to your THC ingestion.  Please avoid using any illicit substances or THC Gummies as they can have adverse side effects.  Follow-up as needed with your primary care provider.  You develop any life-threatening symptoms return to the emergency department.
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
## 2024-07-19 ENCOUNTER — Ambulatory Visit: Payer: MEDICAID

## 2024-07-19 ENCOUNTER — Other Ambulatory Visit (HOSPITAL_COMMUNITY)
Admission: RE | Admit: 2024-07-19 | Discharge: 2024-07-19 | Disposition: A | Discharge status: Home / Self Care | Source: Ambulatory Visit | Attending: Advanced Practice Midwife | Admitting: Advanced Practice Midwife

## 2024-07-19 VITALS — BP 142/90 | HR 102

## 2024-07-19 DIAGNOSIS — Z113 Encounter for screening for infections with a predominantly sexual mode of transmission: Secondary | ICD-10-CM | POA: Diagnosis present

## 2024-07-19 NOTE — Progress Notes (Signed)
 SUBJECTIVE:  38 y.o. female who desires a STI screen. Denies abnormal vaginal discharge, bleeding or significant pelvic pain. No UTI symptoms. Denies history of known exposure to STD.  No LMP recorded. (Menstrual status: IUD).  OBJECTIVE:  She appears well.   ASSESSMENT:  STI Screen   PLAN:  Pt offered STI blood screening-not indicated GC, chlamydia, and trichomonas probe sent to lab.  Treatment: To be determined once lab results are received.  Pt follow up as needed.

## 2024-07-20 LAB — CERVICOVAGINAL ANCILLARY ONLY
Bacterial Vaginitis (gardnerella): POSITIVE — AB
Candida Glabrata: NEGATIVE
Candida Vaginitis: NEGATIVE
Chlamydia: NEGATIVE
Comment: NEGATIVE
Comment: NEGATIVE
Comment: NEGATIVE
Comment: NEGATIVE
Comment: NEGATIVE
Comment: NORMAL
Neisseria Gonorrhea: NEGATIVE
Trichomonas: NEGATIVE

## 2024-07-25 ENCOUNTER — Other Ambulatory Visit: Payer: Self-pay

## 2024-07-25 DIAGNOSIS — B9689 Other specified bacterial agents as the cause of diseases classified elsewhere: Secondary | ICD-10-CM

## 2024-07-25 MED ORDER — METRONIDAZOLE 500 MG PO TABS: 500.0000 mg | ORAL_TABLET | Freq: Two times a day (BID) | ORAL | 0 refills | Status: AC

## 2024-07-25 NOTE — Progress Notes (Signed)
 Returned call, pt requesting rx for +bv results, sent per protocol

## 2024-08-01 ENCOUNTER — Encounter: Payer: Self-pay | Admitting: Obstetrics and Gynecology

## 2024-08-01 ENCOUNTER — Ambulatory Visit: Payer: MEDICAID | Admitting: Obstetrics and Gynecology

## 2024-08-01 VITALS — BP 133/93 | HR 72 | Ht 69.0 in | Wt 290.0 lb

## 2024-08-01 DIAGNOSIS — N3941 Urge incontinence: Secondary | ICD-10-CM

## 2024-08-01 DIAGNOSIS — L309 Dermatitis, unspecified: Secondary | ICD-10-CM | POA: Diagnosis not present

## 2024-08-01 DIAGNOSIS — N760 Acute vaginitis: Secondary | ICD-10-CM | POA: Diagnosis not present

## 2024-08-01 DIAGNOSIS — N393 Stress incontinence (female) (male): Secondary | ICD-10-CM | POA: Diagnosis not present

## 2024-08-01 DIAGNOSIS — B9689 Other specified bacterial agents as the cause of diseases classified elsewhere: Secondary | ICD-10-CM

## 2024-08-01 MED ORDER — NYSTATIN-TRIAMCINOLONE 100000-0.1 UNIT/GM-% EX OINT: 1.0000 | TOPICAL_OINTMENT | Freq: Two times a day (BID) | CUTANEOUS | 0 refills | Status: AC

## 2024-08-01 NOTE — Progress Notes (Signed)
 Acne in pelvic area on buttock area. Long time problem. Wants med for it. Also under arms and breasts. Asking for referral for pelvic floor problem.

## 2024-08-01 NOTE — Progress Notes (Signed)
   Breanna PROGRESS NOTE  History:  38 y.o. [id number] presents to [facility] for problem visit. She reports rash under [name], Breanna [name], Breanna [name], Breanna [name], current medications, past family history, past medical history, past social history, past surgical history and problem list. Last pap smear on 03/28/24 was normal, neg HRHPV.  Health Maintenance Due  Topic Date Due   Hepatitis C Screening  Never done   Pneumococcal Vaccine (1 of 2 - PCV) Never done   Hepatitis B Vaccines 19-59 Average Risk (1 of 3 - 19+ 3-dose series) Never done   HPV VACCINES (1 - 3-dose SCDM series) Never done   Influenza Vaccine  06/16/2024   COVID-19 Vaccine (1 - 2024-25 season) Never done     Review of Systems:  Pertinent items are noted in HPI.   Objective:  Physical Exam Blood pressure (!) 133/93, pulse 72, height 5' 9 (1.753 m), weight 290 lb (131.5 kg), last menstrual period 07/17/2024. VS reviewed, nursing note reviewed,  Constitutional: well developed, well nourished, no distress HEENT: normocephalic CV: normal rate Pulm/chest wall: normal effort [name] Exam: deferred Abdomen: soft Neuro: alert and oriented x 3 Skin: warm, dry Psych: affect normal Pelvic exam: deferred   Assessment & Plan:  1. Stress incontinence (Primary) 2. Urge incontinence Desires trial of pelvic PT, referral placed  - Ambulatory referral to Physical Therapy  3. Dermatitis Trial mycolog Has derm appt in November, notified to follow up if no improvement  - nystatin -triamcinolone  ointment (MYCOLOG); Apply 1 Application topically 2 (two) times daily.  Dispense: 30 g; Refill: 0  4. history of bacterial [name] Discussed supportive measures  for recurrence, just started vaginal probiotic, discussed avoidance of scented soaps/detergent, getting out of wet or sweaty clothing quickly, air free time  Return in about 1 year (around 08/01/2025) for [place] Breanna Delores, FNP

## 2024-08-04 ENCOUNTER — Ambulatory Visit: Attending: Obstetrics and Gynecology | Admitting: Physical Therapy

## 2024-08-04 DIAGNOSIS — N393 Stress incontinence (female) (male): Secondary | ICD-10-CM | POA: Insufficient documentation

## 2024-08-04 DIAGNOSIS — M6281 Muscle weakness (generalized): Secondary | ICD-10-CM | POA: Insufficient documentation

## 2024-08-04 DIAGNOSIS — N3941 Urge incontinence: Secondary | ICD-10-CM | POA: Diagnosis not present

## 2024-08-04 DIAGNOSIS — R279 Unspecified lack of coordination: Secondary | ICD-10-CM | POA: Diagnosis present
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
## 2024-08-08 ENCOUNTER — Telehealth: Payer: Self-pay | Admitting: Physical Therapy

## 2024-08-08 ENCOUNTER — Ambulatory Visit: Payer: Self-pay | Admitting: Physical Therapy

## 2024-08-08 NOTE — Telephone Encounter (Signed)
 Left patient a VM re no show for her PT visit today. Patient to call us  at [phone]

## 2024-09-29 ENCOUNTER — Telehealth: Payer: Self-pay | Admitting: Physical Therapy

## 2024-09-29 ENCOUNTER — Ambulatory Visit: Attending: Obstetrics and Gynecology | Admitting: Physical Therapy

## 2024-09-29 DIAGNOSIS — M6281 Muscle weakness (generalized): Secondary | ICD-10-CM | POA: Insufficient documentation

## 2024-09-29 DIAGNOSIS — N3941 Urge incontinence: Secondary | ICD-10-CM | POA: Insufficient documentation

## 2024-09-29 DIAGNOSIS — R279 Unspecified lack of coordination: Secondary | ICD-10-CM | POA: Insufficient documentation

## 2024-09-29 DIAGNOSIS — N393 Stress incontinence (female) (male): Secondary | ICD-10-CM | POA: Insufficient documentation

## 2024-09-29 NOTE — Telephone Encounter (Signed)
 Spoke with patient re no show for PT appt this morning. Patient stated that she has not privacy where she lives right now and will resume PT in February when she moves.

## 2024-10-06 ENCOUNTER — Encounter: Admitting: Physical Therapy

## 2024-10-20 ENCOUNTER — Encounter: Admitting: Physical Therapy

## 2024-10-27 ENCOUNTER — Encounter: Admitting: Physical Therapy

## 2024-11-03 ENCOUNTER — Encounter: Admitting: Physical Therapy

## 2024-11-29 ENCOUNTER — Ambulatory Visit: Admitting: Family Medicine

## 2024-11-29 ENCOUNTER — Encounter: Payer: Self-pay | Admitting: Family Medicine

## 2024-11-29 VITALS — BP 124/68 | HR 96 | Ht 69.0 in | Wt 293.8 lb

## 2024-11-29 DIAGNOSIS — F902 Attention-deficit hyperactivity disorder, combined type: Secondary | ICD-10-CM | POA: Diagnosis not present

## 2024-11-29 DIAGNOSIS — Z6841 Body Mass Index (BMI) 40.0 and over, adult: Secondary | ICD-10-CM

## 2024-11-29 DIAGNOSIS — R9431 Abnormal electrocardiogram [ECG] [EKG]: Secondary | ICD-10-CM | POA: Diagnosis not present

## 2024-11-29 DIAGNOSIS — F3342 Major depressive disorder, recurrent, in full remission: Secondary | ICD-10-CM

## 2024-11-29 DIAGNOSIS — E66813 Obesity, class 3: Secondary | ICD-10-CM | POA: Diagnosis not present

## 2024-11-29 DIAGNOSIS — R635 Abnormal weight gain: Secondary | ICD-10-CM
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
## 2024-12-16 DIAGNOSIS — R635 Abnormal weight gain: Secondary | ICD-10-CM | POA: Insufficient documentation

## 2024-12-16 DIAGNOSIS — E66813 Obesity, class 3: Secondary | ICD-10-CM | POA: Insufficient documentation

## 2024-12-16 DIAGNOSIS — R9431 Abnormal electrocardiogram [ECG] [EKG]: Secondary | ICD-10-CM | POA: Insufficient documentation

## 2024-12-16 DIAGNOSIS — F3342 Major depressive disorder, recurrent, in full remission: Secondary | ICD-10-CM | POA: Insufficient documentation

## 2024-12-16 DIAGNOSIS — F902 Attention-deficit hyperactivity disorder, combined type: Secondary | ICD-10-CM | POA: Insufficient documentation

## 2025-01-05 ENCOUNTER — Ambulatory Visit: Admitting: Family Medicine
